# Patient Record
Sex: Female | Born: 1935 | Race: White | Hispanic: No | Smoking: Never smoker
Health system: Southern US, Community
[De-identification: ages and names within clinical notes are randomized; demographics above are authoritative.]

## PROBLEM LIST (undated history)

## (undated) DIAGNOSIS — Z789 Other specified health status: Secondary | ICD-10-CM

## (undated) DIAGNOSIS — M47816 Spondylosis without myelopathy or radiculopathy, lumbar region: Secondary | ICD-10-CM

## (undated) DIAGNOSIS — C801 Malignant (primary) neoplasm, unspecified: Secondary | ICD-10-CM

## (undated) DIAGNOSIS — C8590 Non-Hodgkin lymphoma, unspecified, unspecified site: Principal | ICD-10-CM

## (undated) DIAGNOSIS — J449 Chronic obstructive pulmonary disease, unspecified: Secondary | ICD-10-CM

## (undated) DIAGNOSIS — I10 Essential (primary) hypertension: Secondary | ICD-10-CM

## (undated) DIAGNOSIS — F0390 Unspecified dementia without behavioral disturbance: Secondary | ICD-10-CM

## (undated) DIAGNOSIS — M810 Age-related osteoporosis without current pathological fracture: Secondary | ICD-10-CM

## (undated) DIAGNOSIS — E785 Hyperlipidemia, unspecified: Secondary | ICD-10-CM

## (undated) HISTORY — PX: APPENDECTOMY: SHX54

## (undated) HISTORY — DX: Hyperlipidemia, unspecified: E78.5

## (undated) HISTORY — PX: SHOULDER SURGERY: SHX246

## (undated) HISTORY — PX: TONSILLECTOMY: SUR1361

## (undated) HISTORY — DX: Essential (primary) hypertension: I10

## (undated) HISTORY — PX: ABDOMINAL HYSTERECTOMY: SHX81

## (undated) HISTORY — DX: Spondylosis without myelopathy or radiculopathy, lumbar region: M47.816

## (undated) HISTORY — PX: HERNIA REPAIR: SHX51

## (undated) HISTORY — PX: COLON SURGERY: SHX602

## (undated) HISTORY — DX: Chronic obstructive pulmonary disease, unspecified: J44.9

## (undated) HISTORY — PX: HEMORROIDECTOMY: SUR656

## (undated) HISTORY — DX: Non-Hodgkin lymphoma, unspecified, unspecified site: C85.90

## (undated) HISTORY — DX: Age-related osteoporosis without current pathological fracture: M81.0

## (undated) HISTORY — PX: SPLENECTOMY: SUR1306

---

## 1997-12-06 ENCOUNTER — Other Ambulatory Visit: Admission: RE | Admit: 1997-12-06 | Discharge: 1997-12-06 | Payer: Self-pay | Admitting: Oncology

## 1997-12-08 ENCOUNTER — Other Ambulatory Visit: Admission: RE | Admit: 1997-12-08 | Discharge: 1997-12-08 | Payer: Self-pay | Admitting: Gastroenterology

## 1997-12-09 ENCOUNTER — Other Ambulatory Visit: Admission: RE | Admit: 1997-12-09 | Discharge: 1997-12-09 | Payer: Self-pay | Admitting: Gastroenterology

## 1997-12-11 ENCOUNTER — Other Ambulatory Visit: Admission: RE | Admit: 1997-12-11 | Discharge: 1997-12-11 | Payer: Self-pay | Admitting: Gastroenterology

## 1997-12-13 ENCOUNTER — Other Ambulatory Visit: Admission: RE | Admit: 1997-12-13 | Discharge: 1997-12-13 | Payer: Self-pay | Admitting: Oncology

## 1997-12-24 ENCOUNTER — Ambulatory Visit (HOSPITAL_COMMUNITY): Admission: RE | Admit: 1997-12-24 | Discharge: 1997-12-24 | Payer: Self-pay | Admitting: Gastroenterology

## 1998-04-15 ENCOUNTER — Encounter: Payer: Self-pay | Admitting: Oncology

## 1998-04-15 ENCOUNTER — Ambulatory Visit (HOSPITAL_COMMUNITY): Admission: RE | Admit: 1998-04-15 | Discharge: 1998-04-15 | Payer: Self-pay | Admitting: Oncology

## 1998-06-13 ENCOUNTER — Other Ambulatory Visit: Admission: RE | Admit: 1998-06-13 | Discharge: 1998-06-13 | Payer: Self-pay | Admitting: Gynecology

## 1998-08-13 ENCOUNTER — Ambulatory Visit: Admission: RE | Admit: 1998-08-13 | Discharge: 1998-08-13 | Payer: Self-pay | Admitting: Endocrinology

## 1998-08-13 ENCOUNTER — Encounter: Payer: Self-pay | Admitting: Endocrinology

## 1998-08-27 ENCOUNTER — Ambulatory Visit (HOSPITAL_COMMUNITY): Admission: RE | Admit: 1998-08-27 | Discharge: 1998-08-27 | Payer: Self-pay | Admitting: Endocrinology

## 1998-08-27 ENCOUNTER — Encounter: Payer: Self-pay | Admitting: Endocrinology

## 1998-10-21 ENCOUNTER — Ambulatory Visit (HOSPITAL_COMMUNITY): Admission: RE | Admit: 1998-10-21 | Discharge: 1998-10-21 | Payer: Self-pay | Admitting: Gastroenterology

## 1998-10-21 ENCOUNTER — Encounter: Payer: Self-pay | Admitting: Gastroenterology

## 1999-02-17 ENCOUNTER — Ambulatory Visit (HOSPITAL_COMMUNITY): Admission: RE | Admit: 1999-02-17 | Discharge: 1999-02-17 | Payer: Self-pay | Admitting: Oncology

## 1999-02-17 ENCOUNTER — Encounter: Payer: Self-pay | Admitting: Oncology

## 1999-05-08 ENCOUNTER — Encounter: Payer: Self-pay | Admitting: Endocrinology

## 1999-05-08 ENCOUNTER — Ambulatory Visit (HOSPITAL_COMMUNITY): Admission: RE | Admit: 1999-05-08 | Discharge: 1999-05-08 | Payer: Self-pay | Admitting: Endocrinology

## 1999-05-20 ENCOUNTER — Encounter: Admission: RE | Admit: 1999-05-20 | Discharge: 1999-05-20 | Payer: Self-pay | Admitting: Endocrinology

## 1999-05-20 ENCOUNTER — Encounter: Payer: Self-pay | Admitting: Endocrinology

## 1999-08-13 ENCOUNTER — Encounter: Payer: Self-pay | Admitting: Oncology

## 1999-08-13 ENCOUNTER — Ambulatory Visit: Admission: RE | Admit: 1999-08-13 | Discharge: 1999-08-13 | Payer: Self-pay | Admitting: Oncology

## 1999-08-20 ENCOUNTER — Other Ambulatory Visit: Admission: RE | Admit: 1999-08-20 | Discharge: 1999-08-20 | Payer: Self-pay | Admitting: Gynecology

## 1999-10-19 ENCOUNTER — Encounter: Payer: Self-pay | Admitting: Endocrinology

## 1999-10-19 ENCOUNTER — Ambulatory Visit (HOSPITAL_COMMUNITY): Admission: RE | Admit: 1999-10-19 | Discharge: 1999-10-19 | Payer: Self-pay | Admitting: Endocrinology

## 1999-10-20 ENCOUNTER — Inpatient Hospital Stay (HOSPITAL_COMMUNITY): Admission: AD | Admit: 1999-10-20 | Discharge: 1999-11-11 | Payer: Self-pay | Admitting: Endocrinology

## 1999-10-20 ENCOUNTER — Encounter (INDEPENDENT_AMBULATORY_CARE_PROVIDER_SITE_OTHER): Payer: Self-pay | Admitting: Specialist

## 1999-10-21 ENCOUNTER — Encounter: Payer: Self-pay | Admitting: Endocrinology

## 1999-10-25 ENCOUNTER — Encounter: Payer: Self-pay | Admitting: Oncology

## 1999-10-26 ENCOUNTER — Encounter: Payer: Self-pay | Admitting: Oncology

## 1999-10-29 ENCOUNTER — Encounter: Payer: Self-pay | Admitting: Oncology

## 1999-10-30 ENCOUNTER — Encounter: Payer: Self-pay | Admitting: Oncology

## 1999-11-17 ENCOUNTER — Inpatient Hospital Stay (HOSPITAL_COMMUNITY): Admission: AD | Admit: 1999-11-17 | Discharge: 1999-11-23 | Payer: Self-pay | Admitting: Oncology

## 1999-11-17 ENCOUNTER — Encounter (INDEPENDENT_AMBULATORY_CARE_PROVIDER_SITE_OTHER): Payer: Self-pay | Admitting: *Deleted

## 1999-11-25 ENCOUNTER — Inpatient Hospital Stay (HOSPITAL_COMMUNITY): Admission: AD | Admit: 1999-11-25 | Discharge: 1999-11-30 | Payer: Self-pay | Admitting: Oncology

## 2000-02-23 ENCOUNTER — Encounter: Payer: Self-pay | Admitting: Oncology

## 2000-02-23 ENCOUNTER — Encounter: Admission: RE | Admit: 2000-02-23 | Discharge: 2000-02-23 | Payer: Self-pay | Admitting: Oncology

## 2000-02-25 ENCOUNTER — Ambulatory Visit (HOSPITAL_COMMUNITY): Admission: RE | Admit: 2000-02-25 | Discharge: 2000-02-25 | Payer: Self-pay | Admitting: Oncology

## 2000-02-25 ENCOUNTER — Encounter: Payer: Self-pay | Admitting: Oncology

## 2000-03-03 ENCOUNTER — Encounter (INDEPENDENT_AMBULATORY_CARE_PROVIDER_SITE_OTHER): Payer: Self-pay | Admitting: Specialist

## 2000-03-03 ENCOUNTER — Ambulatory Visit (HOSPITAL_COMMUNITY): Admission: RE | Admit: 2000-03-03 | Discharge: 2000-03-03 | Payer: Self-pay | Admitting: Oncology

## 2000-03-19 ENCOUNTER — Ambulatory Visit (HOSPITAL_COMMUNITY): Admission: RE | Admit: 2000-03-19 | Discharge: 2000-03-19 | Payer: Self-pay | Admitting: Oncology

## 2000-03-20 ENCOUNTER — Encounter: Payer: Self-pay | Admitting: Oncology

## 2000-03-20 ENCOUNTER — Ambulatory Visit (HOSPITAL_COMMUNITY): Admission: RE | Admit: 2000-03-20 | Discharge: 2000-03-20 | Payer: Self-pay | Admitting: Oncology

## 2000-03-25 ENCOUNTER — Encounter (HOSPITAL_COMMUNITY): Admission: RE | Admit: 2000-03-25 | Discharge: 2000-06-23 | Payer: Self-pay | Admitting: Oncology

## 2000-03-25 ENCOUNTER — Encounter (INDEPENDENT_AMBULATORY_CARE_PROVIDER_SITE_OTHER): Payer: Self-pay | Admitting: *Deleted

## 2000-03-25 ENCOUNTER — Encounter: Payer: Self-pay | Admitting: Oncology

## 2000-04-11 ENCOUNTER — Encounter: Admission: RE | Admit: 2000-04-11 | Discharge: 2000-04-11 | Payer: Self-pay | Admitting: Orthopedic Surgery

## 2000-04-11 ENCOUNTER — Encounter: Payer: Self-pay | Admitting: Orthopedic Surgery

## 2000-06-23 ENCOUNTER — Ambulatory Visit (HOSPITAL_COMMUNITY): Admission: RE | Admit: 2000-06-23 | Discharge: 2000-06-23 | Payer: Self-pay | Admitting: Oncology

## 2000-06-23 ENCOUNTER — Encounter: Payer: Self-pay | Admitting: Oncology

## 2000-08-25 ENCOUNTER — Other Ambulatory Visit: Admission: RE | Admit: 2000-08-25 | Discharge: 2000-08-25 | Payer: Self-pay | Admitting: Gynecology

## 2000-08-30 ENCOUNTER — Encounter: Payer: Self-pay | Admitting: Oncology

## 2000-08-30 ENCOUNTER — Ambulatory Visit (HOSPITAL_COMMUNITY): Admission: RE | Admit: 2000-08-30 | Discharge: 2000-08-30 | Payer: Self-pay | Admitting: Oncology

## 2000-09-01 ENCOUNTER — Ambulatory Visit (HOSPITAL_COMMUNITY): Admission: RE | Admit: 2000-09-01 | Discharge: 2000-09-01 | Payer: Self-pay | Admitting: Oncology

## 2000-09-01 ENCOUNTER — Encounter: Payer: Self-pay | Admitting: Oncology

## 2000-09-05 ENCOUNTER — Ambulatory Visit (HOSPITAL_COMMUNITY): Admission: RE | Admit: 2000-09-05 | Discharge: 2000-09-05 | Payer: Self-pay | Admitting: Oncology

## 2000-09-05 ENCOUNTER — Encounter: Payer: Self-pay | Admitting: Oncology

## 2000-12-28 ENCOUNTER — Encounter: Payer: Self-pay | Admitting: Oncology

## 2000-12-28 ENCOUNTER — Ambulatory Visit (HOSPITAL_COMMUNITY): Admission: RE | Admit: 2000-12-28 | Discharge: 2000-12-28 | Payer: Self-pay | Admitting: Oncology

## 2001-02-21 ENCOUNTER — Encounter: Admission: RE | Admit: 2001-02-21 | Discharge: 2001-02-21 | Payer: Self-pay | Admitting: Endocrinology

## 2001-02-21 ENCOUNTER — Encounter: Payer: Self-pay | Admitting: Endocrinology

## 2001-04-17 ENCOUNTER — Encounter: Payer: Self-pay | Admitting: Oncology

## 2001-04-17 ENCOUNTER — Ambulatory Visit (HOSPITAL_COMMUNITY): Admission: RE | Admit: 2001-04-17 | Discharge: 2001-04-17 | Payer: Self-pay | Admitting: Oncology

## 2001-04-19 ENCOUNTER — Encounter: Payer: Self-pay | Admitting: Oncology

## 2001-04-19 ENCOUNTER — Ambulatory Visit (HOSPITAL_COMMUNITY): Admission: RE | Admit: 2001-04-19 | Discharge: 2001-04-19 | Payer: Self-pay | Admitting: Oncology

## 2001-07-17 ENCOUNTER — Ambulatory Visit (HOSPITAL_COMMUNITY): Admission: RE | Admit: 2001-07-17 | Discharge: 2001-07-17 | Payer: Self-pay | Admitting: Oncology

## 2001-07-17 ENCOUNTER — Encounter: Payer: Self-pay | Admitting: Oncology

## 2001-08-31 ENCOUNTER — Other Ambulatory Visit: Admission: RE | Admit: 2001-08-31 | Discharge: 2001-08-31 | Payer: Self-pay | Admitting: Gynecology

## 2002-01-16 ENCOUNTER — Encounter: Payer: Self-pay | Admitting: Oncology

## 2002-01-16 ENCOUNTER — Encounter (HOSPITAL_COMMUNITY): Admission: RE | Admit: 2002-01-16 | Discharge: 2002-04-16 | Payer: Self-pay | Admitting: Oncology

## 2002-01-19 ENCOUNTER — Encounter: Payer: Self-pay | Admitting: Oncology

## 2002-03-23 ENCOUNTER — Encounter: Payer: Self-pay | Admitting: Endocrinology

## 2002-03-23 ENCOUNTER — Encounter: Admission: RE | Admit: 2002-03-23 | Discharge: 2002-03-23 | Payer: Self-pay | Admitting: Endocrinology

## 2002-03-27 ENCOUNTER — Encounter: Admission: RE | Admit: 2002-03-27 | Discharge: 2002-03-27 | Payer: Self-pay | Admitting: Endocrinology

## 2002-03-27 ENCOUNTER — Encounter: Payer: Self-pay | Admitting: Endocrinology

## 2002-04-10 ENCOUNTER — Encounter: Payer: Self-pay | Admitting: Oncology

## 2002-04-10 ENCOUNTER — Ambulatory Visit (HOSPITAL_COMMUNITY): Admission: RE | Admit: 2002-04-10 | Discharge: 2002-04-10 | Payer: Self-pay | Admitting: Oncology

## 2002-07-23 ENCOUNTER — Ambulatory Visit (HOSPITAL_COMMUNITY): Admission: RE | Admit: 2002-07-23 | Discharge: 2002-07-23 | Payer: Self-pay | Admitting: Oncology

## 2002-07-23 ENCOUNTER — Encounter: Payer: Self-pay | Admitting: Oncology

## 2002-07-26 ENCOUNTER — Encounter: Payer: Self-pay | Admitting: Oncology

## 2002-07-26 ENCOUNTER — Ambulatory Visit (HOSPITAL_COMMUNITY): Admission: RE | Admit: 2002-07-26 | Discharge: 2002-07-26 | Payer: Self-pay | Admitting: Oncology

## 2002-11-06 ENCOUNTER — Encounter: Payer: Self-pay | Admitting: Oncology

## 2002-11-06 ENCOUNTER — Ambulatory Visit (HOSPITAL_COMMUNITY): Admission: RE | Admit: 2002-11-06 | Discharge: 2002-11-06 | Payer: Self-pay | Admitting: Oncology

## 2002-11-13 ENCOUNTER — Encounter: Payer: Self-pay | Admitting: Oncology

## 2002-11-13 ENCOUNTER — Ambulatory Visit (HOSPITAL_COMMUNITY): Admission: RE | Admit: 2002-11-13 | Discharge: 2002-11-13 | Payer: Self-pay | Admitting: Oncology

## 2003-02-18 ENCOUNTER — Encounter: Admission: RE | Admit: 2003-02-18 | Discharge: 2003-02-18 | Payer: Self-pay | Admitting: Endocrinology

## 2003-02-18 ENCOUNTER — Encounter: Payer: Self-pay | Admitting: Endocrinology

## 2003-04-18 ENCOUNTER — Ambulatory Visit (HOSPITAL_COMMUNITY): Admission: RE | Admit: 2003-04-18 | Discharge: 2003-04-18 | Payer: Self-pay | Admitting: Oncology

## 2003-04-22 ENCOUNTER — Ambulatory Visit (HOSPITAL_COMMUNITY): Admission: RE | Admit: 2003-04-22 | Discharge: 2003-04-22 | Payer: Self-pay | Admitting: Oncology

## 2003-05-15 ENCOUNTER — Encounter: Admission: RE | Admit: 2003-05-15 | Discharge: 2003-05-15 | Payer: Self-pay | Admitting: Orthopedic Surgery

## 2003-08-28 ENCOUNTER — Encounter (INDEPENDENT_AMBULATORY_CARE_PROVIDER_SITE_OTHER): Payer: Self-pay | Admitting: Specialist

## 2003-08-28 ENCOUNTER — Ambulatory Visit (HOSPITAL_COMMUNITY): Admission: RE | Admit: 2003-08-28 | Discharge: 2003-08-28 | Payer: Self-pay | Admitting: Gastroenterology

## 2003-09-30 ENCOUNTER — Other Ambulatory Visit: Admission: RE | Admit: 2003-09-30 | Discharge: 2003-09-30 | Payer: Self-pay | Admitting: Gynecology

## 2003-10-31 ENCOUNTER — Ambulatory Visit (HOSPITAL_COMMUNITY): Admission: RE | Admit: 2003-10-31 | Discharge: 2003-10-31 | Payer: Self-pay | Admitting: Oncology

## 2003-11-04 ENCOUNTER — Ambulatory Visit (HOSPITAL_COMMUNITY): Admission: RE | Admit: 2003-11-04 | Discharge: 2003-11-04 | Payer: Self-pay | Admitting: Oncology

## 2003-11-07 ENCOUNTER — Inpatient Hospital Stay (HOSPITAL_COMMUNITY): Admission: RE | Admit: 2003-11-07 | Discharge: 2003-11-09 | Payer: Self-pay | Admitting: Orthopedic Surgery

## 2003-12-17 ENCOUNTER — Ambulatory Visit (HOSPITAL_COMMUNITY): Admission: RE | Admit: 2003-12-17 | Discharge: 2003-12-17 | Payer: Self-pay | Admitting: Oncology

## 2004-01-23 ENCOUNTER — Encounter: Admission: RE | Admit: 2004-01-23 | Discharge: 2004-01-23 | Payer: Self-pay | Admitting: Endocrinology

## 2004-01-29 ENCOUNTER — Ambulatory Visit (HOSPITAL_COMMUNITY): Admission: RE | Admit: 2004-01-29 | Discharge: 2004-01-29 | Payer: Self-pay | Admitting: Oncology

## 2004-02-25 ENCOUNTER — Encounter: Admission: RE | Admit: 2004-02-25 | Discharge: 2004-02-25 | Payer: Self-pay | Admitting: Endocrinology

## 2004-05-05 ENCOUNTER — Encounter: Admission: RE | Admit: 2004-05-05 | Discharge: 2004-05-26 | Payer: Self-pay | Admitting: Orthopedic Surgery

## 2004-06-02 ENCOUNTER — Ambulatory Visit: Payer: Self-pay | Admitting: Oncology

## 2004-06-03 ENCOUNTER — Ambulatory Visit (HOSPITAL_COMMUNITY): Admission: RE | Admit: 2004-06-03 | Discharge: 2004-06-03 | Payer: Self-pay | Admitting: Oncology

## 2004-09-14 ENCOUNTER — Ambulatory Visit: Payer: Self-pay | Admitting: Oncology

## 2004-09-17 ENCOUNTER — Ambulatory Visit (HOSPITAL_COMMUNITY): Admission: RE | Admit: 2004-09-17 | Discharge: 2004-09-17 | Payer: Self-pay | Admitting: Oncology

## 2005-01-28 ENCOUNTER — Ambulatory Visit: Payer: Self-pay | Admitting: Oncology

## 2005-02-04 ENCOUNTER — Ambulatory Visit (HOSPITAL_COMMUNITY): Admission: RE | Admit: 2005-02-04 | Discharge: 2005-02-04 | Payer: Self-pay | Admitting: Oncology

## 2005-03-12 ENCOUNTER — Encounter: Admission: RE | Admit: 2005-03-12 | Discharge: 2005-03-12 | Payer: Self-pay | Admitting: Oncology

## 2005-05-31 ENCOUNTER — Ambulatory Visit: Payer: Self-pay | Admitting: Oncology

## 2005-08-30 ENCOUNTER — Ambulatory Visit: Payer: Self-pay | Admitting: Oncology

## 2005-08-31 LAB — COMPREHENSIVE METABOLIC PANEL
AST: 54 U/L — ABNORMAL HIGH (ref 0–37)
Albumin: 4.3 g/dL (ref 3.5–5.2)
Alkaline Phosphatase: 80 U/L (ref 39–117)
BUN: 19 mg/dL (ref 6–23)
Creatinine, Ser: 0.9 mg/dL (ref 0.4–1.2)
Glucose, Bld: 130 mg/dL — ABNORMAL HIGH (ref 70–99)
Potassium: 4.1 mEq/L (ref 3.5–5.3)
Total Bilirubin: 0.6 mg/dL (ref 0.3–1.2)

## 2005-08-31 LAB — CBC WITH DIFFERENTIAL/PLATELET
BASO%: 0.1 % (ref 0.0–2.0)
Eosinophils Absolute: 0.1 10*3/uL (ref 0.0–0.5)
HCT: 41 % (ref 34.8–46.6)
LYMPH%: 12.9 % — ABNORMAL LOW (ref 14.0–48.0)
MONO#: 1.2 10*3/uL — ABNORMAL HIGH (ref 0.1–0.9)
NEUT#: 7.7 10*3/uL — ABNORMAL HIGH (ref 1.5–6.5)
Platelets: 316 10*3/uL (ref 145–400)
RBC: 4.3 10*6/uL (ref 3.70–5.32)
WBC: 10.4 10*3/uL — ABNORMAL HIGH (ref 3.9–10.0)
lymph#: 1.3 10*3/uL (ref 0.9–3.3)

## 2005-08-31 LAB — MORPHOLOGY: PLT EST: ADEQUATE

## 2005-09-01 ENCOUNTER — Ambulatory Visit (HOSPITAL_COMMUNITY): Admission: RE | Admit: 2005-09-01 | Discharge: 2005-09-01 | Payer: Self-pay | Admitting: Oncology

## 2005-09-02 ENCOUNTER — Ambulatory Visit (HOSPITAL_COMMUNITY): Admission: RE | Admit: 2005-09-02 | Discharge: 2005-09-02 | Payer: Self-pay | Admitting: Oncology

## 2005-09-30 ENCOUNTER — Other Ambulatory Visit: Admission: RE | Admit: 2005-09-30 | Discharge: 2005-09-30 | Payer: Self-pay | Admitting: Gynecology

## 2005-12-28 ENCOUNTER — Ambulatory Visit (HOSPITAL_COMMUNITY): Admission: RE | Admit: 2005-12-28 | Discharge: 2005-12-28 | Payer: Self-pay | Admitting: Oncology

## 2005-12-28 ENCOUNTER — Ambulatory Visit: Payer: Self-pay | Admitting: Oncology

## 2005-12-28 LAB — CBC WITH DIFFERENTIAL/PLATELET
BASO%: 0.1 % (ref 0.0–2.0)
EOS%: 1.3 % (ref 0.0–7.0)
HCT: 40.6 % (ref 34.8–46.6)
HGB: 13.8 g/dL (ref 11.6–15.9)
LYMPH%: 26.2 % (ref 14.0–48.0)
MCH: 32.8 pg (ref 26.0–34.0)
MCHC: 34 g/dL (ref 32.0–36.0)
MONO#: 0.8 10*3/uL (ref 0.1–0.9)
NEUT%: 61.9 % (ref 39.6–76.8)
Platelets: 338 10*3/uL (ref 145–400)
RBC: 4.21 10*6/uL (ref 3.70–5.32)
RDW: 13.9 % (ref 11.3–14.5)
WBC: 7.5 10*3/uL (ref 3.9–10.0)
lymph#: 2 10*3/uL (ref 0.9–3.3)

## 2005-12-28 LAB — COMPREHENSIVE METABOLIC PANEL
Alkaline Phosphatase: 89 U/L (ref 39–117)
BUN: 18 mg/dL (ref 6–23)
CO2: 25 mEq/L (ref 19–32)
Glucose, Bld: 161 mg/dL — ABNORMAL HIGH (ref 70–99)
Total Bilirubin: 0.4 mg/dL (ref 0.3–1.2)

## 2005-12-28 LAB — LACTATE DEHYDROGENASE: LDH: 173 U/L (ref 94–250)

## 2005-12-28 LAB — MORPHOLOGY: PLT EST: ADEQUATE

## 2006-03-15 ENCOUNTER — Encounter: Admission: RE | Admit: 2006-03-15 | Discharge: 2006-03-15 | Payer: Self-pay | Admitting: Oncology

## 2006-04-13 ENCOUNTER — Other Ambulatory Visit: Admission: RE | Admit: 2006-04-13 | Discharge: 2006-04-13 | Payer: Self-pay | Admitting: Gynecology

## 2006-05-11 ENCOUNTER — Ambulatory Visit: Payer: Self-pay | Admitting: Oncology

## 2006-05-16 LAB — CBC WITH DIFFERENTIAL/PLATELET
EOS%: 1.2 % (ref 0.0–7.0)
Eosinophils Absolute: 0.1 10*3/uL (ref 0.0–0.5)
MCH: 31.9 pg (ref 26.0–34.0)
MCV: 96.3 fL (ref 81.0–101.0)
MONO%: 11.8 % (ref 0.0–13.0)
NEUT#: 6.1 10*3/uL (ref 1.5–6.5)
RBC: 3.99 10*6/uL (ref 3.70–5.32)
RDW: 14.3 % (ref 11.3–14.5)
lymph#: 1.8 10*3/uL (ref 0.9–3.3)

## 2006-05-16 LAB — COMPREHENSIVE METABOLIC PANEL
CO2: 23 mEq/L (ref 19–32)
Calcium: 9 mg/dL (ref 8.4–10.5)
Chloride: 99 mEq/L (ref 96–112)
Creatinine, Ser: 0.78 mg/dL (ref 0.40–1.20)
Glucose, Bld: 88 mg/dL (ref 70–99)
Total Bilirubin: 0.6 mg/dL (ref 0.3–1.2)
Total Protein: 6.7 g/dL (ref 6.0–8.3)

## 2006-05-16 LAB — LACTATE DEHYDROGENASE: LDH: 200 U/L (ref 94–250)

## 2006-05-19 ENCOUNTER — Ambulatory Visit (HOSPITAL_COMMUNITY): Admission: RE | Admit: 2006-05-19 | Discharge: 2006-05-19 | Payer: Self-pay | Admitting: Oncology

## 2006-10-13 ENCOUNTER — Other Ambulatory Visit: Admission: RE | Admit: 2006-10-13 | Discharge: 2006-10-13 | Payer: Self-pay | Admitting: Gynecology

## 2006-11-10 ENCOUNTER — Ambulatory Visit: Payer: Self-pay | Admitting: Oncology

## 2006-11-16 ENCOUNTER — Ambulatory Visit (HOSPITAL_COMMUNITY): Admission: RE | Admit: 2006-11-16 | Discharge: 2006-11-16 | Payer: Self-pay | Admitting: Oncology

## 2006-11-16 LAB — COMPREHENSIVE METABOLIC PANEL
ALT: 25 U/L (ref 0–35)
Albumin: 4.1 g/dL (ref 3.5–5.2)
Alkaline Phosphatase: 75 U/L (ref 39–117)
CO2: 26 mEq/L (ref 19–32)
Glucose, Bld: 138 mg/dL — ABNORMAL HIGH (ref 70–99)
Potassium: 4.4 mEq/L (ref 3.5–5.3)
Sodium: 140 mEq/L (ref 135–145)
Total Bilirubin: 0.4 mg/dL (ref 0.3–1.2)
Total Protein: 7 g/dL (ref 6.0–8.3)

## 2006-11-16 LAB — CBC WITH DIFFERENTIAL/PLATELET
Basophils Absolute: 0 10*3/uL (ref 0.0–0.1)
Eosinophils Absolute: 0.2 10*3/uL (ref 0.0–0.5)
HCT: 38.3 % (ref 34.8–46.6)
HGB: 13.1 g/dL (ref 11.6–15.9)
LYMPH%: 22.6 % (ref 14.0–48.0)
MONO#: 1.1 10*3/uL — ABNORMAL HIGH (ref 0.1–0.9)
NEUT#: 4.8 10*3/uL (ref 1.5–6.5)
NEUT%: 60.8 % (ref 39.6–76.8)
Platelets: 301 10*3/uL (ref 145–400)
WBC: 7.8 10*3/uL (ref 3.9–10.0)
lymph#: 1.8 10*3/uL (ref 0.9–3.3)

## 2006-11-16 LAB — MORPHOLOGY: PLT EST: ADEQUATE

## 2006-11-16 LAB — LACTATE DEHYDROGENASE: LDH: 154 U/L (ref 94–250)

## 2006-12-22 ENCOUNTER — Ambulatory Visit (HOSPITAL_COMMUNITY): Admission: RE | Admit: 2006-12-22 | Discharge: 2006-12-22 | Payer: Self-pay | Admitting: Endocrinology

## 2007-03-22 ENCOUNTER — Encounter: Admission: RE | Admit: 2007-03-22 | Discharge: 2007-03-22 | Payer: Self-pay | Admitting: Gynecology

## 2007-04-04 ENCOUNTER — Encounter: Admission: RE | Admit: 2007-04-04 | Discharge: 2007-04-04 | Payer: Self-pay | Admitting: Gynecology

## 2007-05-26 ENCOUNTER — Ambulatory Visit: Payer: Self-pay | Admitting: Oncology

## 2007-05-30 ENCOUNTER — Ambulatory Visit (HOSPITAL_COMMUNITY): Admission: RE | Admit: 2007-05-30 | Discharge: 2007-05-30 | Payer: Self-pay | Admitting: Oncology

## 2007-05-30 LAB — MORPHOLOGY

## 2007-05-30 LAB — CBC WITH DIFFERENTIAL/PLATELET
BASO%: 0.4 % (ref 0.0–2.0)
EOS%: 2.7 % (ref 0.0–7.0)
HCT: 42.1 % (ref 34.8–46.6)
LYMPH%: 34.2 % (ref 14.0–48.0)
MCH: 31.7 pg (ref 26.0–34.0)
MCHC: 33.4 g/dL (ref 32.0–36.0)
MONO#: 1.4 10*3/uL — ABNORMAL HIGH (ref 0.1–0.9)
NEUT%: 48 % (ref 39.6–76.8)
Platelets: 297 10*3/uL (ref 145–400)
RBC: 4.45 10*6/uL (ref 3.70–5.32)
WBC: 9.2 10*3/uL (ref 3.9–10.0)

## 2007-05-30 LAB — COMPREHENSIVE METABOLIC PANEL
ALT: 41 U/L — ABNORMAL HIGH (ref 0–35)
BUN: 17 mg/dL (ref 6–23)
CO2: 27 mEq/L (ref 19–32)
Calcium: 9.9 mg/dL (ref 8.4–10.5)
Chloride: 98 mEq/L (ref 96–112)
Creatinine, Ser: 0.95 mg/dL (ref 0.40–1.20)

## 2007-05-30 LAB — LACTATE DEHYDROGENASE: LDH: 17 U/L — ABNORMAL LOW (ref 94–250)

## 2007-09-27 ENCOUNTER — Ambulatory Visit: Payer: Self-pay | Admitting: Oncology

## 2007-11-27 ENCOUNTER — Ambulatory Visit: Payer: Self-pay | Admitting: Oncology

## 2007-11-29 ENCOUNTER — Ambulatory Visit (HOSPITAL_COMMUNITY): Admission: RE | Admit: 2007-11-29 | Discharge: 2007-11-29 | Payer: Self-pay | Admitting: Oncology

## 2007-11-29 LAB — COMPREHENSIVE METABOLIC PANEL
AST: 22 U/L (ref 0–37)
Albumin: 4.2 g/dL (ref 3.5–5.2)
BUN: 15 mg/dL (ref 6–23)
CO2: 27 mEq/L (ref 19–32)
Calcium: 10.3 mg/dL (ref 8.4–10.5)
Chloride: 100 mEq/L (ref 96–112)
Potassium: 4.4 mEq/L (ref 3.5–5.3)

## 2007-11-29 LAB — CBC WITH DIFFERENTIAL/PLATELET
BASO%: 0.4 % (ref 0.0–2.0)
Basophils Absolute: 0 10*3/uL (ref 0.0–0.1)
EOS%: 3.9 % (ref 0.0–7.0)
HCT: 41 % (ref 34.8–46.6)
HGB: 14 g/dL (ref 11.6–15.9)
LYMPH%: 25.2 % (ref 14.0–48.0)
MCH: 31.5 pg (ref 26.0–34.0)
MCHC: 34 g/dL (ref 32.0–36.0)
MCV: 92.7 fL (ref 81.0–101.0)
MONO%: 13.1 % — ABNORMAL HIGH (ref 0.0–13.0)
NEUT%: 57.4 % (ref 39.6–76.8)
Platelets: 295 10*3/uL (ref 145–400)
lymph#: 1.8 10*3/uL (ref 0.9–3.3)

## 2007-11-29 LAB — MORPHOLOGY

## 2007-11-29 LAB — LACTATE DEHYDROGENASE: LDH: 150 U/L (ref 94–250)

## 2008-03-22 ENCOUNTER — Encounter: Admission: RE | Admit: 2008-03-22 | Discharge: 2008-03-22 | Payer: Self-pay | Admitting: Endocrinology

## 2008-05-22 ENCOUNTER — Ambulatory Visit: Payer: Self-pay | Admitting: Oncology

## 2008-05-24 LAB — COMPREHENSIVE METABOLIC PANEL
ALT: 25 U/L (ref 0–35)
AST: 30 U/L (ref 0–37)
CO2: 25 mEq/L (ref 19–32)
Creatinine, Ser: 1.06 mg/dL (ref 0.40–1.20)
Sodium: 136 mEq/L (ref 135–145)
Total Bilirubin: 0.4 mg/dL (ref 0.3–1.2)
Total Protein: 6.5 g/dL (ref 6.0–8.3)

## 2008-05-24 LAB — CBC WITH DIFFERENTIAL/PLATELET
Basophils Absolute: 0 10*3/uL (ref 0.0–0.1)
EOS%: 4.1 % (ref 0.0–7.0)
Eosinophils Absolute: 0.3 10*3/uL (ref 0.0–0.5)
HCT: 38.7 % (ref 34.8–46.6)
HGB: 13.1 g/dL (ref 11.6–15.9)
MCH: 32.1 pg (ref 26.0–34.0)
MCV: 94.9 fL (ref 81.0–101.0)
MONO%: 13.9 % — ABNORMAL HIGH (ref 0.0–13.0)
NEUT#: 3.6 10*3/uL (ref 1.5–6.5)
NEUT%: 49.6 % (ref 39.6–76.8)
RDW: 13.8 % (ref 11.3–14.5)
lymph#: 2.3 10*3/uL (ref 0.9–3.3)

## 2008-05-24 LAB — MORPHOLOGY: PLT EST: ADEQUATE

## 2008-05-27 ENCOUNTER — Ambulatory Visit (HOSPITAL_COMMUNITY): Admission: RE | Admit: 2008-05-27 | Discharge: 2008-05-27 | Payer: Self-pay | Admitting: Oncology

## 2009-01-27 ENCOUNTER — Ambulatory Visit (HOSPITAL_COMMUNITY): Admission: RE | Admit: 2009-01-27 | Discharge: 2009-01-28 | Payer: Self-pay | Admitting: General Surgery

## 2009-05-23 ENCOUNTER — Ambulatory Visit: Payer: Self-pay | Admitting: Oncology

## 2009-05-30 LAB — COMPREHENSIVE METABOLIC PANEL
AST: 33 U/L (ref 0–37)
Alkaline Phosphatase: 48 U/L (ref 39–117)
BUN: 16 mg/dL (ref 6–23)
Creatinine, Ser: 1.2 mg/dL (ref 0.40–1.20)
Glucose, Bld: 91 mg/dL (ref 70–99)
Total Bilirubin: 0.9 mg/dL (ref 0.3–1.2)

## 2009-05-30 LAB — MORPHOLOGY: PLT EST: ADEQUATE

## 2009-05-30 LAB — CBC WITH DIFFERENTIAL/PLATELET
BASO%: 1 % (ref 0.0–2.0)
Basophils Absolute: 0.1 10*3/uL (ref 0.0–0.1)
EOS%: 2.1 % (ref 0.0–7.0)
HCT: 38.8 % (ref 34.8–46.6)
LYMPH%: 22.8 % (ref 14.0–49.7)
MCH: 30.8 pg (ref 25.1–34.0)
MCHC: 33 g/dL (ref 31.5–36.0)
MCV: 93.3 fL (ref 79.5–101.0)
MONO%: 20.5 % — ABNORMAL HIGH (ref 0.0–14.0)
NEUT%: 53.6 % (ref 38.4–76.8)
lymph#: 1.9 10*3/uL (ref 0.9–3.3)

## 2009-06-04 ENCOUNTER — Ambulatory Visit (HOSPITAL_COMMUNITY): Admission: RE | Admit: 2009-06-04 | Discharge: 2009-06-04 | Payer: Self-pay | Admitting: Oncology

## 2009-07-21 ENCOUNTER — Inpatient Hospital Stay (HOSPITAL_COMMUNITY): Admission: EM | Admit: 2009-07-21 | Discharge: 2009-07-24 | Payer: Self-pay | Admitting: Endocrinology

## 2010-01-28 ENCOUNTER — Encounter: Admission: RE | Admit: 2010-01-28 | Discharge: 2010-01-28 | Payer: Self-pay | Admitting: Otolaryngology

## 2010-05-28 ENCOUNTER — Ambulatory Visit: Payer: Self-pay | Admitting: Oncology

## 2010-06-01 LAB — CBC WITH DIFFERENTIAL/PLATELET
BASO%: 0.5 % (ref 0.0–2.0)
Basophils Absolute: 0 10*3/uL (ref 0.0–0.1)
EOS%: 2.7 % (ref 0.0–7.0)
Eosinophils Absolute: 0.2 10*3/uL (ref 0.0–0.5)
HCT: 41.4 % (ref 34.8–46.6)
HGB: 13.6 g/dL (ref 11.6–15.9)
LYMPH%: 30 % (ref 14.0–49.7)
MCH: 30.6 pg (ref 25.1–34.0)
MCHC: 32.8 g/dL (ref 31.5–36.0)
MCV: 93.4 fL (ref 79.5–101.0)
MONO#: 1 10*3/uL — ABNORMAL HIGH (ref 0.1–0.9)
MONO%: 13.3 % (ref 0.0–14.0)
NEUT#: 4.2 10*3/uL (ref 1.5–6.5)
NEUT%: 53.5 % (ref 38.4–76.8)
Platelets: 320 10*3/uL (ref 145–400)
RBC: 4.43 10*6/uL (ref 3.70–5.45)
RDW: 14 % (ref 11.2–14.5)
WBC: 7.8 10*3/uL (ref 3.9–10.3)
lymph#: 2.3 10*3/uL (ref 0.9–3.3)

## 2010-06-01 LAB — COMPREHENSIVE METABOLIC PANEL
ALT: 21 U/L (ref 0–35)
AST: 41 U/L — ABNORMAL HIGH (ref 0–37)
Albumin: 4.3 g/dL (ref 3.5–5.2)
Alkaline Phosphatase: 55 U/L (ref 39–117)
BUN: 19 mg/dL (ref 6–23)
CO2: 25 mEq/L (ref 19–32)
Calcium: 10.8 mg/dL — ABNORMAL HIGH (ref 8.4–10.5)
Chloride: 100 mEq/L (ref 96–112)
Creatinine, Ser: 0.91 mg/dL (ref 0.40–1.20)
Glucose, Bld: 83 mg/dL (ref 70–99)
Potassium: 4.4 mEq/L (ref 3.5–5.3)
Sodium: 137 mEq/L (ref 135–145)
Total Bilirubin: 0.5 mg/dL (ref 0.3–1.2)
Total Protein: 7 g/dL (ref 6.0–8.3)

## 2010-06-01 LAB — SEDIMENTATION RATE: Sed Rate: 5 mm/hr (ref 0–22)

## 2010-06-01 LAB — MORPHOLOGY: PLT EST: ADEQUATE

## 2010-06-01 LAB — LACTATE DEHYDROGENASE: LDH: 161 U/L (ref 94–250)

## 2010-06-07 ENCOUNTER — Encounter: Payer: Self-pay | Admitting: Oncology

## 2010-06-09 ENCOUNTER — Other Ambulatory Visit: Payer: Self-pay | Admitting: Oncology

## 2010-06-09 DIAGNOSIS — C8590 Non-Hodgkin lymphoma, unspecified, unspecified site: Secondary | ICD-10-CM

## 2010-06-17 ENCOUNTER — Ambulatory Visit (HOSPITAL_COMMUNITY)
Admission: RE | Admit: 2010-06-17 | Discharge: 2010-06-17 | Disposition: A | Discharge status: Home / Self Care | Payer: Medicare Other | Source: Ambulatory Visit | Attending: Endocrinology | Admitting: Endocrinology

## 2010-06-17 ENCOUNTER — Other Ambulatory Visit (HOSPITAL_COMMUNITY): Payer: Self-pay | Admitting: Endocrinology

## 2010-06-17 DIAGNOSIS — S52539A Colles' fracture of unspecified radius, initial encounter for closed fracture: Secondary | ICD-10-CM | POA: Insufficient documentation

## 2010-06-17 DIAGNOSIS — W19XXXA Unspecified fall, initial encounter: Secondary | ICD-10-CM

## 2010-06-17 DIAGNOSIS — M19049 Primary osteoarthritis, unspecified hand: Secondary | ICD-10-CM | POA: Insufficient documentation

## 2010-06-17 DIAGNOSIS — M11829 Other specified crystal arthropathies, unspecified elbow: Secondary | ICD-10-CM | POA: Insufficient documentation

## 2010-06-17 DIAGNOSIS — M19039 Primary osteoarthritis, unspecified wrist: Secondary | ICD-10-CM | POA: Insufficient documentation

## 2010-06-30 ENCOUNTER — Other Ambulatory Visit: Payer: Self-pay | Admitting: Otolaryngology

## 2010-06-30 DIAGNOSIS — R52 Pain, unspecified: Secondary | ICD-10-CM

## 2010-07-06 ENCOUNTER — Ambulatory Visit
Admission: RE | Admit: 2010-07-06 | Discharge: 2010-07-06 | Disposition: A | Discharge status: Home / Self Care | Payer: Medicare Other | Source: Ambulatory Visit | Attending: Otolaryngology | Admitting: Otolaryngology

## 2010-07-06 DIAGNOSIS — R52 Pain, unspecified: Secondary | ICD-10-CM

## 2010-07-06 MED ORDER — GADOBENATE DIMEGLUMINE 529 MG/ML IV SOLN
9.0000 mL | Freq: Once | INTRAVENOUS | Status: AC | PRN
  Administered 2010-07-06: 9 mL via INTRAVENOUS

## 2010-07-14 ENCOUNTER — Ambulatory Visit (HOSPITAL_COMMUNITY)
Admission: RE | Admit: 2010-07-14 | Discharge: 2010-07-14 | Disposition: A | Discharge status: Home / Self Care | Payer: Medicare Other | Source: Ambulatory Visit | Attending: Oncology | Admitting: Oncology

## 2010-07-14 ENCOUNTER — Encounter (HOSPITAL_COMMUNITY): Payer: Self-pay

## 2010-07-14 ENCOUNTER — Encounter (HOSPITAL_COMMUNITY)
Admission: RE | Admit: 2010-07-14 | Discharge: 2010-07-14 | Disposition: A | Discharge status: Home / Self Care | Payer: Medicare Other | Source: Ambulatory Visit | Attending: Oncology | Admitting: Oncology

## 2010-07-14 DIAGNOSIS — S62109A Fracture of unspecified carpal bone, unspecified wrist, initial encounter for closed fracture: Secondary | ICD-10-CM | POA: Insufficient documentation

## 2010-07-14 DIAGNOSIS — M79609 Pain in unspecified limb: Secondary | ICD-10-CM | POA: Insufficient documentation

## 2010-07-14 DIAGNOSIS — W19XXXA Unspecified fall, initial encounter: Secondary | ICD-10-CM | POA: Insufficient documentation

## 2010-07-14 DIAGNOSIS — C8590 Non-Hodgkin lymphoma, unspecified, unspecified site: Secondary | ICD-10-CM

## 2010-07-14 HISTORY — DX: Malignant (primary) neoplasm, unspecified: C80.1

## 2010-07-14 MED ORDER — TECHNETIUM TC 99M MEDRONATE IV KIT
23.1000 | PACK | Freq: Once | INTRAVENOUS | Status: AC | PRN
  Administered 2010-07-14: 23.1 via INTRAVENOUS

## 2010-07-21 ENCOUNTER — Other Ambulatory Visit: Payer: Self-pay | Admitting: Oncology

## 2010-07-21 ENCOUNTER — Encounter (HOSPITAL_BASED_OUTPATIENT_CLINIC_OR_DEPARTMENT_OTHER): Payer: Medicare Other | Admitting: Oncology

## 2010-07-21 DIAGNOSIS — C8589 Other specified types of non-Hodgkin lymphoma, extranodal and solid organ sites: Secondary | ICD-10-CM

## 2010-07-21 LAB — COMPREHENSIVE METABOLIC PANEL
ALT: 18 U/L (ref 0–35)
CO2: 23 mEq/L (ref 19–32)
Calcium: 10 mg/dL (ref 8.4–10.5)
Chloride: 101 mEq/L (ref 96–112)
Creatinine, Ser: 0.98 mg/dL (ref 0.40–1.20)
Glucose, Bld: 86 mg/dL (ref 70–99)
Sodium: 136 mEq/L (ref 135–145)
Total Bilirubin: 0.6 mg/dL (ref 0.3–1.2)
Total Protein: 7.2 g/dL (ref 6.0–8.3)

## 2010-07-21 LAB — LACTATE DEHYDROGENASE: LDH: 187 U/L (ref 94–250)

## 2010-07-21 LAB — CBC WITH DIFFERENTIAL/PLATELET
Basophils Absolute: 0.1 10*3/uL (ref 0.0–0.1)
Eosinophils Absolute: 0.2 10*3/uL (ref 0.0–0.5)
HGB: 13.2 g/dL (ref 11.6–15.9)
LYMPH%: 28.5 % (ref 14.0–49.7)
MCV: 88.4 fL (ref 79.5–101.0)
MONO#: 1.2 10*3/uL — ABNORMAL HIGH (ref 0.1–0.9)
MONO%: 11.9 % (ref 0.0–14.0)
NEUT#: 5.5 10*3/uL (ref 1.5–6.5)
Platelets: 308 10*3/uL (ref 145–400)
RBC: 4.4 10*6/uL (ref 3.70–5.45)
WBC: 9.7 10*3/uL (ref 3.9–10.3)
nRBC: 0 % (ref 0–0)

## 2010-08-09 LAB — BASIC METABOLIC PANEL
BUN: 6 mg/dL (ref 6–23)
BUN: 9 mg/dL (ref 6–23)
CO2: 24 mEq/L (ref 19–32)
CO2: 27 mEq/L (ref 19–32)
Calcium: 9 mg/dL (ref 8.4–10.5)
Chloride: 109 mEq/L (ref 96–112)
Creatinine, Ser: 0.8 mg/dL (ref 0.4–1.2)
GFR calc non Af Amer: >60 mL/min (ref >60)
GFR calc non Af Amer: >60 mL/min (ref >60)
Glucose, Bld: 85 mg/dL (ref 70–99)
Glucose, Bld: 85 mg/dL (ref 70–99)
Glucose, Bld: 95 mg/dL (ref 70–99)
Potassium: 3.3 mEq/L — ABNORMAL LOW (ref 3.5–5.1)
Potassium: 3.8 mEq/L (ref 3.5–5.1)
Sodium: 134 mEq/L — ABNORMAL LOW (ref 135–145)
Sodium: 139 mEq/L (ref 135–145)

## 2010-08-09 LAB — PROTIME-INR
Prothrombin Time: 58 seconds — ABNORMAL HIGH (ref 11.6–15.2)
Prothrombin Time: 61.4 seconds — ABNORMAL HIGH (ref 11.6–15.2)

## 2010-08-09 LAB — HEPATIC FUNCTION PANEL
AST: 33 U/L (ref 0–37)
Bilirubin, Direct: <0.1 mg/dL (ref 0.0–0.3)
Total Bilirubin: 0.6 mg/dL (ref 0.3–1.2)

## 2010-08-09 LAB — URINE CULTURE
Colony Count: NO GROWTH
Culture: NO GROWTH
Special Requests: NEGATIVE

## 2010-08-09 LAB — CBC
HCT: 28.8 % — ABNORMAL LOW (ref 36.0–46.0)
Hemoglobin: 10.6 g/dL — ABNORMAL LOW (ref 12.0–15.0)
Hemoglobin: 9.3 g/dL — ABNORMAL LOW (ref 12.0–15.0)
MCHC: 32.3 g/dL (ref 30.0–36.0)
MCHC: 32.8 g/dL (ref 30.0–36.0)
MCV: 97.1 fL (ref 78.0–100.0)
Platelets: 357 10*3/uL (ref 150–400)
RDW: 14.5 % (ref 11.5–15.5)
RDW: 14.8 % (ref 11.5–15.5)

## 2010-08-09 LAB — URINALYSIS, ROUTINE W REFLEX MICROSCOPIC
Bilirubin Urine: NEGATIVE
Nitrite: NEGATIVE
Urobilinogen, UA: 0.2 mg/dL (ref 0.0–1.0)
pH: 7 (ref 5.0–8.0)

## 2010-08-21 LAB — COMPREHENSIVE METABOLIC PANEL
Albumin: 3.9 g/dL (ref 3.5–5.2)
BUN: 21 mg/dL (ref 6–23)
Creatinine, Ser: 1.15 mg/dL (ref 0.4–1.2)
Total Bilirubin: 0.4 mg/dL (ref 0.3–1.2)
Total Protein: 7.8 g/dL (ref 6.0–8.3)

## 2010-08-21 LAB — URINE MICROSCOPIC-ADD ON

## 2010-08-21 LAB — DIFFERENTIAL
Basophils Absolute: 0.1 10*3/uL (ref 0.0–0.1)
Eosinophils Relative: 2 % (ref 0–5)
Monocytes Absolute: 1.5 10*3/uL — ABNORMAL HIGH (ref 0.1–1.0)
Monocytes Relative: 16 % — ABNORMAL HIGH (ref 3–12)
Neutrophils Relative %: 58 % (ref 43–77)

## 2010-08-21 LAB — URINALYSIS, ROUTINE W REFLEX MICROSCOPIC
Ketones, ur: NEGATIVE mg/dL
Nitrite: NEGATIVE
Urobilinogen, UA: 0.2 mg/dL (ref 0.0–1.0)
pH: 7.5 (ref 5.0–8.0)

## 2010-08-21 LAB — CBC
HCT: 40.7 % (ref 36.0–46.0)
MCV: 95.4 fL (ref 78.0–100.0)
Platelets: 349 10*3/uL (ref 150–400)
RDW: 14.5 % (ref 11.5–15.5)

## 2010-10-02 NOTE — Discharge Summary (Signed)
NAME:  [name], [name]                      ACCOUNT NO.:  [id number]   MEDICAL RECORD NO.:  [id number]                   PATIENT TYPE:  INP   LOCATION:  0440                                 FACILITY:  [facility]   PHYSICIAN:  Marlowe Kays, M.D.               DATE OF BIRTH:  [birth year]   DATE OF ADMISSION:  11/06/2003  DATE OF DISCHARGE:  11/08/2003                                 DISCHARGE SUMMARY   ADMISSION DIAGNOSES:  1. Chronic impingement syndrome with rotator cuff tendonopathy of the right     shoulder.  2. Hypertension.  3. History of a stroke in the distant past.  4. Diverticulitis.   DISCHARGE DIAGNOSES:  1. Chronic impingement syndrome with degeneration of labrum and full-     thickness rotator cuff tear of the right shoulder.  2. Hypertension.  3. History of a stroke in the distant past.  4. Diverticulitis.  5. Postoperative nausea secondary to anesthesia and analgesics.   OPERATION:  1. On November 06, 2003, the patient underwent a right shoulder arthroscopy with     debridement of labrum, biceps tendon, and shaving up underneath the     surface of the rotator cuff.  2. Arthroscopic subacromial decompression.  3. Mini open rotator cuff repair of the right shoulder.   ASSISTANT:  Dooley L. Cherlynn June.   BRIEF HISTORY:  This 77 year old lady seen by [place] for continued progressive  problems concerning her right shoulder.  She had pain and discomfort now  going on for a considerable period of time.  After medical clearance by Dr.  Evlyn Kanner, it was felt that the patient would benefit from surgical intervention  and is admitted for the above procedure.   HOSPITAL COURSE:  The patient tolerated the surgical procedure quite well;  however, had a considerable amount of nausea and vomiting postoperatively.  We adjusted her postoperative analgesics, and apparently the oxycodone and  the hydrocodone were the causative factors concerning her nausea.  Eventually, we were able  to give her Darvocet-N 100 for her discomfort.  We  used Zofran, Phenergan, as well as Reglan for her nausea.  We had to keep  her for observations, and she was unable to receive occupational therapy due  to her extreme and profound nausea and vomiting.  The morning of discharge,  she had no nausea since midnight.  It was hoped that her morning would go  well.  She will respond to occupational therapy with her husband present for  home directions.  If she is able to tolerate the Darvocet for discomfort and  Robaxin as a muscle relaxant with no nausea, then we will discharge her home  later today.  Otherwise, we will have to keep her in until Saturday to be  sure that she will not have any incidents at home.   Dressing was changed today.  The wound and portal were all dry, and it was  redressed.  Neurovascular  is intact at the right upper extremity.  Vital  signs were stable.  Patient was awake, alert and oriented.  Sling was in  place.   Laboratory values in the hospital showed a PT/PTT within normal limits.  All  other labs were from Dr. Rinaldo Cloud office.  No chest x-ray was seen.  Dr.  Rinaldo Cloud electrocardiogram was on the chart.   CONDITION ON DISCHARGE:  Improved and stable.   PLAN:  The patient is discharged home in the care of her husband to wear the  sling the majority of the time.  No real range of motion of the right  shoulder.  She may shower four days after surgery.  Be attentive to the  instructions given her by occupational therapy.  She is to resume her home  medications and diet under the direction of Dr. Adrian Prince.   Her medications are Darvocet-N 100 #30 with 2 refills 1-2 q.4-6h. p.r.n.  pain, Robaxin 500 mg #30 with 2 refills 1 q.6-8h. p.r.n. muscle spasm, and  Phenergan 25 mg 1/2 tab to 1 tab q.6h. p.r.n. nausea.   She will continue with a dry dressing to the right shoulder p.r.n.  May  shower four days postop.  Return in two weeks to see Cherly Beach, PA-C   on November 26, 2003.  See Dr. Evlyn Kanner as needed.     Dooley L. Cherlynn June.                 Marlowe Kays, M.D.    DLU/MEDQ  D:  11/08/2003  T:  11/08/2003  Job:  [id number]   cc:   Jeannett Senior A. Evlyn Kanner, M.D.  [address]  [place]  [place] [address]  Fax: [phone]

## 2010-10-02 NOTE — Op Note (Signed)
NAME:  [name], [name]                      ACCOUNT NO.:  [id number]   MEDICAL RECORD NO.:  [id number]                   PATIENT TYPE:  OBV   LOCATION:  0440                                 FACILITY:  [facility]   PHYSICIAN:  Marlowe Kays, M.D.               DATE OF BIRTH:  1922/06/21   DATE OF PROCEDURE:  11/06/2003  DATE OF DISCHARGE:                                 OPERATIVE REPORT   PREOPERATIVE DIAGNOSES:  Chronic impingement syndrome with rotator cuff  tendinopathy right shoulder.   POSTOPERATIVE DIAGNOSES:  Chronic impingement syndrome with degeneration of  labrum and full thickness rotator cuff tear.   OPERATION:  1. Right shoulder arthroscopy with debridement of labrum, biceps tendon and     shaving of underneath surface of rotator cuff.  2. Arthroscopic subacromial decompression.  3. Mini open rotator cuff repair.   SURGEON:  Marlowe Kays, M.D.   ASSISTANTDruscilla Brownie. Cherlynn June.   ANESTHESIA:  General.   PATHOLOGY AND JUSTIFICATION FOR PROCEDURE:  She has had a long history of  right shoulder pain with an MRI showing severe impingement syndrome  associated with degenerative arthritis of the [facility] joint but no full-thickness  rotator cuff tear. This was performed in December of last year.  She has  resisted having arthroscopic treatment for the shoulder and has had several  subacromial steroid injections but I finally strongly recommended to her  that she have the arthroscopic procedure to try and prevent full-thickness  tear. Because of increasing pain recently, she has consented to the surgery.  See operative description below for additional details.   DESCRIPTION OF PROCEDURE:  Interscalene block.  Initially because it was an  arthroscopic procedure did not use prophylactic antibiotics.  Beach chair  position on the Schlein frame, right shoulder girdle was prepped with  Duraprep, draped in a sterile field.  The anatomy of the shoulder joint was  marked  out.  I injected the subacromial space and lateral and posterior  portals with 0.5% Marcaine with adrenaline for hemostasis. Through a  posterior soft spot portal, I atraumatically entered the glenohumeral joint.  It was not apparent at this time that she had any full-thickness rotator  cuff tear.  The degenerated labrum and the underneath surface of the rotator  cuff tear were identified and because of this, I advanced the scope to the  anterior capsule and used the switching stick, made an anterior incision and  over the switching stick used a metal cannula which I used to enter the  joint followed by a 4.2 shaver.  I then debrided down the labrum, the biceps  tendon and the underneath surface of the rotator cuff. I then redirected the  scope into the subacromial space and it was at this point that we noted what  appeared to be a fairly significant tear of the rotator cuff in line with  the fibers with no detachment from the greater  tuberosity.  I then felt that  the best way to manage the case would be to do the arthroscopic subacromial  decompression and then repair the rotator cuff with a mini incision.  She  did have a significant impingement problem. I first used the ArthroCare  vaporizer to remove soft tissue from the underneath surface of the acromion  and around the perimeter anteriorly and then followed this with the 4-0 oval  bur and performed a wide decompression as documented on pictures.  I then  used a spinal needle to localize the area of the tear and made a small  vertical incision at this point which I enlarged slightly as needed to give  me access to and for visualization purposes I had to remove a little bit of  the deltoid off the residual anterior acromion and also removed a little  underneath surface of the anterior acromion with a small rongeur for  exposure.  The tear was about 3 cm in length and just posterior to the level  of the greater tuberosity.  The rotator  cuff at the greater tuberosity was  abraded but not torn.  There was a tongue of rotator cuff between the two  flaps and I felt that the tear would best be handled by a side to side  repair incorporating the tongue without any tension on it so that there  would be no danger of it pulling through it.  I then performed this repair  with multiple interrupted #1 Ethibond and also reinforced slightly what  appeared to be a weak area from abrasion in the superior rotator cuff with  the same.  The wound was then irrigated with sterile saline and prior to the  open repair we gave her a gram of Ancef and will cover her for 24 hours with  it.  I then repaired the vertical incision in the deltoid muscle with  interrupted #1 Vicryl reattaching it to the acromion and also reattached the  partial dissection off the anterior acromion of the fascia with the same.  The subcutaneous tissue was closed with 2-0 Vicryl, skin with Steri-Strips.  The three portals were closed with 4-0 nylon and were covered with Betadine  Adaptic, a dry sterile dressing was applied followed by a shoulder  immobilizer. She tolerated the procedure well and was taken to the recovery  room in satisfactory condition with no known complications.                                               Marlowe Kays, M.D.    JA/MEDQ  D:  11/06/2003  T:  11/06/2003  Job:  [id number]

## 2010-10-02 NOTE — Consult Note (Signed)
[place]  Patient:    [name], [name]                     MRN: [id number] Proc. Date: 10/22/99 Adm. Date:  [id number] Attending:  Julian Hy CC:         Tera Mater. Evlyn Kanner, M.D.                          Consultation Report  A 77 year old woman diagnosed with low-grade non-Hodgkins lymphoma in 1988. She had splenomegaly as the only sign of disease initially.  She underwent splenectomy at [facility].  She had her first relapse with retroperitoneal lymphadenopathy in December 1993.  She was treated with fludarabine chemotherapy plus prednisone and achieved a remission.  She had her second relapse in August 1997, again successfully induced into remission with fludarabine.  A third relapse occurred in September 1998, and she went into a complete remission with Rituxan monoclonal antibody treatment.  Her most recent followup in our office was August 13, 1999.  CT scan of the chest, abdomen, and pelvis done at that time still confirmed remission status.  She now presents with a two-week history of increasing pain in her low back, progressing to her right shoulder and neck.  An outpatient evaluation was initiated by Dr. Ardyth Harps.  Regular x-rays of the spine were unremarkable. Laboratory studies showed expected hypercalcemia in the range of 11.5 to 12. A parathyroid hormone was low.  CT scan of the abdomen and pelvis done at [facility] yesterday shows no lymphadenopathy.  Repeat lumbosacral spine films again negative.  The patient has now developed rapidly progressive confusion and increased pain and was admitted for further evaluation.  PHYSICAL EXAMINATION:  GENERAL:  A mildly confused but cooperative elderly woman who appears acutely ill.  VITAL SIGNS:  Initial vital signs show blood pressure 128/70, pulse 102, respirations 20, temperature 96.3.  SKIN, HAIR, AND NAILS:  Normal.  HEENT:  Pupils reactive to light.  Pharynx  without erythema or exudate.  NECK:  Supple.  No thyromegaly.  LUNGS:  Clear, resonant to percussion.  CARDIAC:  Regular cardiac rhythm without murmur.  BREASTS:  There are no breast masses.  LYMPHADENOPATHY:  None.  ABDOMEN:  No mass, tenderness, or organomegaly.  EXTREMITIES:  No edema, no calf tenderness.  NEUROLOGIC:  Mildly confused.  No focal neurologic deficit.  No cranial nerve palsy.  Motor strength 5/5, reflexes 2+ and symmetric.  PERTINENT LABORATORY DATA:  Hemoglobin 9.2 compared with 13 done in March. MCV 89, white count 4700, platelets 162,000.  Pro time 38.2, INR 7.8 on Coumadin.  BUN 48, creatinine 1.9, alkaline phosphatase 218, SGOT 48, calcium 12.8, albumin 2.6.  Corrected calcium 13.6.  Digoxin level is 2.9.  Sodium 131, potassium 5.1, bicarbonate 30, random glucose 141.  IMPRESSION:  Malignant type of hypercalcemia, etiology unclear at present time.  This is an unusual presentation for relapsed lymphoma.  However, given her longstanding lymphoma history, I would not be surprised if ultimately this turned out to be a lymphoma relapse.  I agree we need to consider other possibilities in our differential including multiple myeloma or a second primary solid tumor.  RECOMMENDATIONS:  I agree with checking serum and urine protein electrophoresis to rule out myeloma.  I would obtain a skeletal bone survey also in this regard.  I would get an MRI of her spine to look for bone marrow abnormalities in  view of the negative regular x-rays.  I agree with aggressive hydration and calcium-lowering agents.  Thank you for this consultation.  I will follow with you. DD:  10/22/99 TD:  10/22/99 Job: [id number]

## 2010-10-02 NOTE — Procedures (Signed)
. [facility]  Patient:    [name], [name]                     MRN: [id number] Proc. Date: 11/20/99 Adm. Date:  [id number] Attending:  Ok Anis CC:         [facility]             [address]                           Procedure Report  HISTORY:  [name] is a 77 year old white female with a history of recurrence of lymphoma, a high-grade non-Hodgkins lymphoma.  The patient has been set up for intrathecal DepoCyt to be administered today.  Complications of the above procedure were explained to the patient, including bleeding, nerve damage, headache, infection, and arachnoiditis.  The patient understood these risks and consented to the procedure.  DESCRIPTION OF PROCEDURE:  The patient was placed in the fetal position on the right side.  The low back was cleaned with Betadine solution, and approximately 2 cc of 1% Xylocaine was used as a local anesthetic.  A 20-gauge spinal needle was inserted in the L3-4 interspace and approximately 12 cc of clear, colorless spinal fluid was removed for testing.  Opening pressure was 70 mmH2O.  Tube #1 was sent for CBC, differential.  Tube #2 was sent for total protein and VDRL.  Tube #3 was sent for cytology.  No complications to the above procedure were noted.  Following removal of spinal fluid, the chemotherapy was administered over 12 minutes with slow, intermittent infusion.  The patient tolerated this well without discomfort.  The needle was withdrawn, and the patient was kept flat for two hours. DD:  11/20/99 TD:  11/20/99 Job: [id number]

## 2010-10-02 NOTE — H&P (Signed)
[place]  Patient:    [name], [name]                     MRN: [id number] Adm. Date:  [id number] Attending:  Ok Anis CC:         Tera Mater. Evlyn Kanner, M.D.                         History and Physical  HISTORY OF PRESENT ILLNESS:  A 77 year old woman with a longstanding history of low-grade non-Hodgkins lymphoma initially diagnosed in 1998, status post splenectomy at that time.  She has had a number of recurrences over the years treated with chemotherapy and most recently with immunotherapy with Rituxan antibody in September 1998.  She presented in June of this year with sudden onset of hypercalcemia and bone pain.  She was found to have a conversion to a high-grade non-Hodgkins lymphoma with disease limited to but heavily involving her bone marrow.  Please see recent H&P and discharge summaries for details.  She was started on a program of combined chemo/immunotherapy with CHOP plus Rituxan.  She was hospitalized last week for her second cycle. During that admission, I began prophylactic intrathecal chemotherapy and she received one dose of DepoCyt on November 19, 1999.  She initially seemed to tolerate this well.  She was observed over the weekend and sent home Monday evening.  Over the last 24 hours she has developed persistent spinal-type headache only relieved by staying motionless in the supine position.  She has not been able to eat or drink.  I prescribed Decadron by mouth to be taken at home for symptoms of arachnoiditis.  However, she promptly threw up when she tried to take the medication.  She is admitted now for further evaluation.  PAST MEDICAL HISTORY:  History includes ischemic cardiac disease, intermittent atrial fibrillation, essential hypertension, previous hysterectomy, appendectomy, hemorrhoidectomy.  CURRENT MEDICATIONS: 1. Norvasc 5 mg. 2. Lanoxin 0.125 mg. 3. Septra DS one q.d. to prevent PCP. 4. Compazine  10 mg q.6h. p.r.n. nausea. 5. Darvocet one q.4h. p.r.n. pain.  ALLERGIES: PENICILLIN.  FAMILY HISTORY, SOCIAL HISTORY:  See recent H&Ps.  REVIEW OF SYSTEMS:  She denied any chest pain, chest pressure, palpitations, abdominal pain, no constipation, diarrhea, melena, hematuria.  PHYSICAL EXAMINATION:  GENERAL:   A frail, elderly woman.  VITAL SIGNS:  Pulse is 73 and regular, blood pressure 155/89, respirations 20, temperature 97.   Weight 105 pounds.  SKIN, HAIR AND NAILS:  Normal.  HEENT:  Pupils reactive to light.  Discs sharp, vessels normal, no hemorrhages.   Pharynx normal no exudate.  NECK:  Supple.  No thyromegaly.  Carotids 2+.  LUNGS:  Clear.  Resonant to percussion.  Regular cardiac rhythm without murmur.  No lymphadenopathy.  ABDOMEN:  No mass, tenderness or organomegaly in the abdomen.  EXTREMITIES:  No edema.  No calf tenderness.  NEUROLOGICAL EXAM:  Mental status intact.  Cranial nerves intact. Coordination normal.  Motor strength 5/5.  Reflexes 1+ and symmetric.  IMPRESSION: 1. 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  This can be a    life threatening situation.  Since she is unable to take steroids by mouth    at home, hospitalization is indicated for parenteral steroids. 2. Dehydration.  Her hemoglobin in our office today is 14.3 g.  She required    a transfusion prior to discharge for hemoglobin 8.7 g.  I would only expect  hemoglobin to go up to about 11.  The fact that it is 14 suggests that she    is hemoconcentrated. 3. Neutropenia.  Total white count today 1100.  Neutrophils only 900.  She is    at risk for infection and is now only a few days post her chemotherapy and    I anticipate further fall in her white count and her platelet count. 4. Histologic conversion to high-grade non-Hodgkins lymphoma.  Guarded    prognosis.  Cytogenetic studies have shown an 814 translocation. 5. Essential hypertension. 6. Ischemic cardiac  disease. 7. Chronic atrial fibrillation. 8. Status post splenectomy in 1988.  PLAN:  Parenteral steroids, hydration, parenteral antibiotics if any fever, Neupogen to accelerate white count recovery. DD:  11/25/99 TD:  11/25/99 Job: [id number]

## 2010-11-04 ENCOUNTER — Encounter (HOSPITAL_BASED_OUTPATIENT_CLINIC_OR_DEPARTMENT_OTHER): Payer: Medicare Other | Admitting: Oncology

## 2010-11-04 ENCOUNTER — Other Ambulatory Visit: Payer: Self-pay | Admitting: Oncology

## 2010-11-04 DIAGNOSIS — C8589 Other specified types of non-Hodgkin lymphoma, extranodal and solid organ sites: Secondary | ICD-10-CM

## 2010-11-04 LAB — CBC WITH DIFFERENTIAL/PLATELET
BASO%: 0.3 % (ref 0.0–2.0)
Basophils Absolute: 0 10*3/uL (ref 0.0–0.1)
EOS%: 2.4 % (ref 0.0–7.0)
HCT: 42 % (ref 34.8–46.6)
LYMPH%: 27.6 % (ref 14.0–49.7)
MCH: 30 pg (ref 25.1–34.0)
MCHC: 32.8 g/dL (ref 31.5–36.0)
MCV: 91.2 fL (ref 79.5–101.0)
MONO%: 13.4 % (ref 0.0–14.0)
NEUT%: 56.3 % (ref 38.4–76.8)
Platelets: 285 10*3/uL (ref 145–400)

## 2010-11-04 LAB — COMPREHENSIVE METABOLIC PANEL
ALT: 18 U/L (ref 0–35)
AST: 39 U/L — ABNORMAL HIGH (ref 0–37)
BUN: 27 mg/dL — ABNORMAL HIGH (ref 6–23)
Creatinine, Ser: 0.88 mg/dL (ref 0.50–1.10)
Total Bilirubin: 0.6 mg/dL (ref 0.3–1.2)

## 2011-03-02 ENCOUNTER — Encounter (HOSPITAL_BASED_OUTPATIENT_CLINIC_OR_DEPARTMENT_OTHER): Payer: Medicare Other | Admitting: Oncology

## 2011-03-02 ENCOUNTER — Other Ambulatory Visit: Payer: Self-pay | Admitting: Oncology

## 2011-03-02 DIAGNOSIS — C8589 Other specified types of non-Hodgkin lymphoma, extranodal and solid organ sites: Secondary | ICD-10-CM

## 2011-03-02 DIAGNOSIS — C8590 Non-Hodgkin lymphoma, unspecified, unspecified site: Secondary | ICD-10-CM

## 2011-03-02 DIAGNOSIS — M549 Dorsalgia, unspecified: Secondary | ICD-10-CM

## 2011-03-02 LAB — COMPREHENSIVE METABOLIC PANEL
ALT: 16 U/L (ref 0–35)
Albumin: 3.9 g/dL (ref 3.5–5.2)
BUN: 26 mg/dL — ABNORMAL HIGH (ref 6–23)
CO2: 25 mEq/L (ref 19–32)
Calcium: 10 mg/dL (ref 8.4–10.5)
Chloride: 99 mEq/L (ref 96–112)
Creatinine, Ser: 0.87 mg/dL (ref 0.50–1.10)

## 2011-03-02 LAB — CBC WITH DIFFERENTIAL/PLATELET
Basophils Absolute: 0.1 10*3/uL (ref 0.0–0.1)
EOS%: 3.4 % (ref 0.0–7.0)
HGB: 13.9 g/dL (ref 11.6–15.9)
MCH: 30.6 pg (ref 25.1–34.0)
NEUT#: 4.2 10*3/uL (ref 1.5–6.5)
RDW: 14.8 % — ABNORMAL HIGH (ref 11.2–14.5)
lymph#: 2.4 10*3/uL (ref 0.9–3.3)

## 2011-03-02 LAB — LACTATE DEHYDROGENASE: LDH: 160 U/L (ref 94–250)

## 2011-03-07 ENCOUNTER — Ambulatory Visit (HOSPITAL_COMMUNITY)
Admission: RE | Admit: 2011-03-07 | Discharge: 2011-03-07 | Disposition: A | Discharge status: Home / Self Care | Payer: Medicare Other | Source: Ambulatory Visit | Attending: Oncology | Admitting: Oncology

## 2011-03-07 ENCOUNTER — Other Ambulatory Visit (HOSPITAL_COMMUNITY): Payer: Medicare Other

## 2011-03-07 DIAGNOSIS — M4000 Postural kyphosis, site unspecified: Secondary | ICD-10-CM | POA: Insufficient documentation

## 2011-03-07 DIAGNOSIS — M549 Dorsalgia, unspecified: Secondary | ICD-10-CM | POA: Insufficient documentation

## 2011-03-07 DIAGNOSIS — C8590 Non-Hodgkin lymphoma, unspecified, unspecified site: Secondary | ICD-10-CM

## 2011-03-07 DIAGNOSIS — M47814 Spondylosis without myelopathy or radiculopathy, thoracic region: Secondary | ICD-10-CM | POA: Insufficient documentation

## 2011-03-07 DIAGNOSIS — M48061 Spinal stenosis, lumbar region without neurogenic claudication: Secondary | ICD-10-CM | POA: Insufficient documentation

## 2011-03-07 DIAGNOSIS — C8589 Other specified types of non-Hodgkin lymphoma, extranodal and solid organ sites: Secondary | ICD-10-CM | POA: Insufficient documentation

## 2011-03-07 MED ORDER — GADOBENATE DIMEGLUMINE 529 MG/ML IV SOLN
9.0000 mL | Freq: Once | INTRAVENOUS | Status: AC | PRN
  Administered 2011-03-07: 9 mL via INTRAVENOUS

## 2011-05-20 ENCOUNTER — Telehealth: Payer: Self-pay | Admitting: Oncology

## 2011-05-20 NOTE — Telephone Encounter (Signed)
called pts home lmovm that her appts in jan2013 were r/s to march 2013 and to rtn call to confirm appts

## 2011-05-31 ENCOUNTER — Telehealth: Payer: Self-pay | Admitting: Oncology

## 2011-05-31 NOTE — Telephone Encounter (Signed)
Talked to pt, gave her appt date for lab 07/16/11 and see Md after a few days, pt is ware of appt

## 2011-06-01 ENCOUNTER — Other Ambulatory Visit: Payer: Medicare Other | Admitting: Lab

## 2011-07-16 ENCOUNTER — Other Ambulatory Visit (HOSPITAL_BASED_OUTPATIENT_CLINIC_OR_DEPARTMENT_OTHER): Payer: Medicare Other | Admitting: Lab

## 2011-07-16 DIAGNOSIS — C8589 Other specified types of non-Hodgkin lymphoma, extranodal and solid organ sites: Secondary | ICD-10-CM

## 2011-07-16 LAB — CBC WITH DIFFERENTIAL/PLATELET
BASO%: 1.3 % (ref 0.0–2.0)
Eosinophils Absolute: 0.3 10*3/uL (ref 0.0–0.5)
MONO#: 0.8 10*3/uL (ref 0.1–0.9)
NEUT#: 4.3 10*3/uL (ref 1.5–6.5)
RBC: 4.45 10*6/uL (ref 3.70–5.45)
RDW: 14.3 % (ref 11.2–14.5)
WBC: 8.2 10*3/uL (ref 3.9–10.3)

## 2011-07-17 LAB — COMPREHENSIVE METABOLIC PANEL
ALT: 17 U/L (ref 0–35)
Albumin: 3.9 g/dL (ref 3.5–5.2)
Alkaline Phosphatase: 63 U/L (ref 39–117)
CO2: 27 mEq/L (ref 19–32)
Glucose, Bld: 103 mg/dL — ABNORMAL HIGH (ref 70–99)
Potassium: 4.2 mEq/L (ref 3.5–5.3)
Sodium: 138 mEq/L (ref 135–145)
Total Protein: 6.8 g/dL (ref 6.0–8.3)

## 2011-07-17 LAB — SEDIMENTATION RATE: Sed Rate: 4 mm/hr (ref 0–22)

## 2011-07-19 ENCOUNTER — Ambulatory Visit: Payer: Medicare Other | Admitting: Oncology

## 2011-07-23 ENCOUNTER — Telehealth: Payer: Self-pay | Admitting: Oncology

## 2011-07-23 ENCOUNTER — Ambulatory Visit (HOSPITAL_BASED_OUTPATIENT_CLINIC_OR_DEPARTMENT_OTHER): Payer: Medicare Other | Admitting: Oncology

## 2011-07-23 ENCOUNTER — Encounter: Payer: Self-pay | Admitting: Oncology

## 2011-07-23 DIAGNOSIS — Z87898 Personal history of other specified conditions: Secondary | ICD-10-CM

## 2011-07-23 DIAGNOSIS — M51379 Other intervertebral disc degeneration, lumbosacral region without mention of lumbar back pain or lower extremity pain: Secondary | ICD-10-CM

## 2011-07-23 DIAGNOSIS — M47816 Spondylosis without myelopathy or radiculopathy, lumbar region: Secondary | ICD-10-CM | POA: Insufficient documentation

## 2011-07-23 DIAGNOSIS — C8297 Follicular lymphoma, unspecified, spleen: Secondary | ICD-10-CM

## 2011-07-23 DIAGNOSIS — E785 Hyperlipidemia, unspecified: Secondary | ICD-10-CM | POA: Insufficient documentation

## 2011-07-23 DIAGNOSIS — C8590 Non-Hodgkin lymphoma, unspecified, unspecified site: Secondary | ICD-10-CM

## 2011-07-23 DIAGNOSIS — Z9221 Personal history of antineoplastic chemotherapy: Secondary | ICD-10-CM

## 2011-07-23 DIAGNOSIS — I10 Essential (primary) hypertension: Secondary | ICD-10-CM

## 2011-07-23 DIAGNOSIS — M5137 Other intervertebral disc degeneration, lumbosacral region: Secondary | ICD-10-CM

## 2011-07-23 DIAGNOSIS — M810 Age-related osteoporosis without current pathological fracture: Secondary | ICD-10-CM | POA: Insufficient documentation

## 2011-07-23 HISTORY — DX: Age-related osteoporosis without current pathological fracture: M81.0

## 2011-07-23 HISTORY — DX: Non-Hodgkin lymphoma, unspecified, unspecified site: C85.90

## 2011-07-23 HISTORY — DX: Essential (primary) hypertension: I10

## 2011-07-23 HISTORY — DX: Hyperlipidemia, unspecified: E78.5

## 2011-07-23 HISTORY — DX: Spondylosis without myelopathy or radiculopathy, lumbar region: M47.816

## 2011-07-23 NOTE — Telephone Encounter (Signed)
gv pt appt for sept2013 

## 2011-07-25 NOTE — Progress Notes (Signed)
Hematology and Oncology Follow Up Visit  [name] [id number] 1922/11/08 78 y.o. 07/25/2011 12:06 PM   Principle Diagnosis: Encounter Diagnoses  Name Primary?  . Lymphoma, high grade Yes  . Lymphoma, nodular, spleen   . Benign essential HTN   . Hyperlipidemia   . Osteoporosis, post-menopausal   . DJD (degenerative joint disease), lumbar      Interim History:   [name] is now 78 years old.  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  She underwent splenectomy.  She progressed to lymph nodes in 1993.  She received intermittent chemotherapy until June 2001 when she had a Richter-type conversion to high-grade lymphoma presenting with diffuse spine pain and hypercalcemia.  Disease limited to the bone and bone marrow.  She was treated with CHOP/Rituxan and achieved a complete response.  She went on to consolidation therapy with Zevalin radioimmunotherapy in January 2002 at [facility].  She has remained in a stable remission now out over 12 years from completion of treatments.    At time of her most recent office visit here in October 2012. She was having increased back pain. I went ahead and get an MRI scan of the lumbar and thoracic spine on 03/07/2011. This showed degenerative changes but no evidence for recurrent lymphoma. The pain has subsided at this point. She always runs a borderline elevated calcium. She has had no interim medical problems  Medications: reviewed  Allergies:  Allergies  Allergen Reactions  . Penicillins   . Shellfish-Derived Products     Review of Systems: Constitutional:   No constitutional symptoms Respiratory: No cough or dyspnea Cardiovascular:  No chest pain or palpitations Gastrointestinal: No change in bowel habit Genito-Urinary: No urinary tract symptoms. No vaginal bleeding. Musculoskeletal: Chronic arthritis of the spine with associated pain. Neurologic: No headache or change in  vision. Skin: No rash or ecchymosis. Remaining ROS negative.  Physical Exam: There were no vitals taken for this visit. Wt Readings from Last 3 Encounters:  No data found for Wt     General appearance: Frail Caucasian woman always well dressed HENNT: Pharynx no erythema or exudate Lymph nodes: No cervical supraclavicular or axillary adenopathy Breasts: Not examined Lungs: Clear to auscultation resonant to percussion Heart: Regular rhythm no murmur Abdomen: Soft nontender status post splenectomy Extremities: No edema no calf tenderness Vascular: No cyanosis Neurologic: Motor strength 5 over 5 reflexes 1+ symmetric Skin: No rash or ecchymosis  Lab Results: Lab Results  Component Value Date   WBC 8.2 07/16/2011   HGB 13.4 07/16/2011   HCT 40.8 07/16/2011   MCV 91.8 07/16/2011   PLT 285 07/16/2011     Chemistry      Component Value Date/Time   NA 138 07/16/2011 1338   K 4.2 07/16/2011 1338   CL 101 07/16/2011 1338   CO2 27 07/16/2011 1338   BUN 26* 07/16/2011 1338   CREATININE 0.91 07/16/2011 1338      Component Value Date/Time   CALCIUM 10.0 07/16/2011 1338   CALCIUM 11.1* 06/09/2010 1610   ALKPHOS 63 07/16/2011 1338   AST 37 07/16/2011 1338   ALT 17 07/16/2011 1338   BILITOT 0.5 07/16/2011 1338       Radiological Studies: See discussion above   Impression and Plan: #1. Initial low-grade splenic lymphoma, progression to low-grade nodular lymphoma in the lymph nodes, then ultimate Richter's conversion to high-grade non-Hodgkin's lymphoma with bone and bone marrow involvement only. Complete response to induction chemotherapy  with the durable remission and likely cure her of following Zevalin radio immunotherapy given 11 years ago! Plan: Continue intermittent clinical observation. #2.Essential hypertension #3. Hyperlipidemia #4. Chronic borderline hypercalcemia.  #5. Advanced degenerative arthritis of the spine. #6. History of atypical nodular pulmonary infiltrates, negative PPD.     CC:.  Dr. Ardyth Harps   Levert Feinstein, MD 3/10/201312:06 PM

## 2011-09-09 ENCOUNTER — Other Ambulatory Visit: Payer: Self-pay

## 2011-09-22 ENCOUNTER — Other Ambulatory Visit: Payer: Self-pay | Admitting: Endocrinology

## 2011-09-22 DIAGNOSIS — G8929 Other chronic pain: Secondary | ICD-10-CM

## 2011-12-30 ENCOUNTER — Other Ambulatory Visit: Payer: Medicare Other

## 2011-12-30 ENCOUNTER — Ambulatory Visit: Payer: Medicare Other | Admitting: Nurse Practitioner

## 2012-01-24 ENCOUNTER — Ambulatory Visit (HOSPITAL_BASED_OUTPATIENT_CLINIC_OR_DEPARTMENT_OTHER): Payer: Medicare Other | Admitting: Oncology

## 2012-01-24 ENCOUNTER — Other Ambulatory Visit (HOSPITAL_BASED_OUTPATIENT_CLINIC_OR_DEPARTMENT_OTHER): Payer: Medicare Other | Admitting: Lab

## 2012-01-24 ENCOUNTER — Telehealth: Payer: Self-pay | Admitting: Oncology

## 2012-01-24 VITALS — BP 150/78 | HR 58 | Temp 97.0°F | Resp 20 | Wt 94.3 lb

## 2012-01-24 DIAGNOSIS — C8297 Follicular lymphoma, unspecified, spleen: Secondary | ICD-10-CM

## 2012-01-24 DIAGNOSIS — Z87898 Personal history of other specified conditions: Secondary | ICD-10-CM

## 2012-01-24 DIAGNOSIS — C9110 Chronic lymphocytic leukemia of B-cell type not having achieved remission: Secondary | ICD-10-CM

## 2012-01-24 DIAGNOSIS — C8589 Other specified types of non-Hodgkin lymphoma, extranodal and solid organ sites: Secondary | ICD-10-CM

## 2012-01-24 DIAGNOSIS — C8590 Non-Hodgkin lymphoma, unspecified, unspecified site: Secondary | ICD-10-CM

## 2012-01-24 DIAGNOSIS — M479 Spondylosis, unspecified: Secondary | ICD-10-CM

## 2012-01-24 DIAGNOSIS — C8510 Unspecified B-cell lymphoma, unspecified site: Secondary | ICD-10-CM

## 2012-01-24 DIAGNOSIS — I10 Essential (primary) hypertension: Secondary | ICD-10-CM

## 2012-01-24 LAB — COMPREHENSIVE METABOLIC PANEL (CC13)
ALT: 19 U/L (ref 0–55)
BUN: 30 mg/dL — ABNORMAL HIGH (ref 7.0–26.0)
CO2: 28 mEq/L (ref 22–29)
Calcium: 10.6 mg/dL — ABNORMAL HIGH (ref 8.4–10.4)
Creatinine: 1 mg/dL (ref 0.6–1.1)
Total Bilirubin: 0.7 mg/dL (ref 0.20–1.20)

## 2012-01-24 LAB — CBC WITH DIFFERENTIAL/PLATELET
Basophils Absolute: 0.1 10*3/uL (ref 0.0–0.1)
Eosinophils Absolute: 0.2 10*3/uL (ref 0.0–0.5)
HCT: 39.4 % (ref 34.8–46.6)
HGB: 13.6 g/dL (ref 11.6–15.9)
MCH: 29.8 pg (ref 25.1–34.0)
MONO#: 1 10*3/uL — ABNORMAL HIGH (ref 0.1–0.9)
NEUT#: 3.2 10*3/uL (ref 1.5–6.5)
NEUT%: 51.6 % (ref 38.4–76.8)
RDW: 15.4 % — ABNORMAL HIGH (ref 11.2–14.5)
WBC: 6.1 10*3/uL (ref 3.9–10.3)
lymph#: 1.8 10*3/uL (ref 0.9–3.3)

## 2012-01-24 LAB — LACTATE DEHYDROGENASE (CC13): LDH: 203 U/L (ref 125–220)

## 2012-01-24 NOTE — Telephone Encounter (Signed)
Gave pt appt calendar  for lab and MD on 07/24/12

## 2012-01-24 NOTE — Progress Notes (Signed)
Hematology and Oncology Follow Up Visit  FELIX PRATT [id number] November 27, 1922 78 y.o. 01/24/2012 11:51 AM   Principle Diagnosis: Encounter Diagnoses  Name Primary?  . High Grade B-Cell Lymphoma Yes  . Richter'S Syndrome      Interim History:   Followup visit for this now 78 year old woman. 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 She underwent splenectomy. She progressed to lymph nodes in 1993. She received intermittent chemotherapy until June 2001 when she had a Richter-type conversion to high-grade lymphoma presenting with diffuse spine pain and hypercalcemia. Disease limited to the bone and bone marrow. She was treated with CHOP/Rituxan and achieved a complete response. She went on to consolidation therapy with Zevalin radioimmunotherapy in January 2002 at [facility]. She has remained in a stable remission now out over 12 years from completion of treatments.  At time of her office visit here in October 2012, she was having increased back pain. I went ahead and got an MRI scan of the lumbar and thoracic spine on 03/07/2011. This showed degenerative changes but no evidence for recurrent lymphoma. Her pain has subsided back to her baseline level. She reports no new areas of pain.   She has had no interim medical problems. She did scrape her right leg with her fingernail. She has very thin skin and is on Coumadin. She developed a large but superficial hematoma. Her daughter as they are and then put a Tegaderm dressing over the wound.   Medications: reviewed  Allergies:  Allergies  Allergen Reactions  . Penicillins   . Shellfish-Derived Products     Review of Systems: Constitutional:   No constitutional symptoms Respiratory: No cough or dyspnea Cardiovascular:  No chest pain or palpitations Gastrointestinal: No change in bowel habit Genito-Urinary: Not questioned Musculoskeletal: See above Neurologic: Daughter reports that  her mother's memory is getting worse and worse over time Skin: See above Remaining ROS negative.  Physical Exam: Blood pressure 150/78, pulse 58, temperature 97 F (36.1 C), temperature source Oral, resp. rate 20, weight 94 lb 4.8 oz (42.774 kg). Wt Readings from Last 3 Encounters:  01/24/12 94 lb 4.8 oz (42.774 kg)     General appearance: Frail, thin, Caucasian woman HENNT: Pharynx no erythema or exudate Lymph nodes: No adenopathy Breasts: Not examined today Lungs: Clear to auscultation resonant to percussion Heart: Regular rhythm no murmur Abdomen: Soft nontender status post splenectomy Extremities: No edema no calf tenderness. Ecchymosis over the right shin Vascular: No cyanosis Neurologic: Motor strength is 5 over 5. Reflexes 1+ symmetric. Poor recent memory. Skin: See above  Lab Results: Lab Results  Component Value Date   WBC 6.1 01/24/2012   HGB 13.6 01/24/2012   HCT 39.4 01/24/2012   MCV 86.4 01/24/2012   PLT 325 01/24/2012     Chemistry      Component Value Date/Time   NA 138 07/16/2011 1338   K 4.2 07/16/2011 1338   CL 101 07/16/2011 1338   CO2 27 07/16/2011 1338   BUN 26* 07/16/2011 1338   CREATININE 0.91 07/16/2011 1338      Component Value Date/Time   CALCIUM 10.0 07/16/2011 1338   CALCIUM 11.1* 06/09/2010 1610   ALKPHOS 63 07/16/2011 1338   AST 37 07/16/2011 1338   ALT 17 07/16/2011 1338   BILITOT 0.5 07/16/2011 1338     Chemistry profile today pending    Impression and Plan: #1. Initial low-grade splenic lymphoma, progression to low-grade nodular lymphoma in the lymph nodes,  then ultimate Richter's conversion to high-grade non-Hodgkin's lymphoma with bone and bone marrow involvement only presenting with bone pain and hypercalcemia. Complete response to induction chemotherapy with CHOP then durable remission and likely cure her of following consolidation with Zevalin radio immunotherapy given 12 years ago!  Plan: Continue intermittent clinical observation.  #2.Essential  hypertension  #3. Hyperlipidemia  #4. Chronic borderline hypercalcemia.  #5. Advanced degenerative arthritis of the spine.  #6. History of atypical nodular pulmonary infiltrates, negative PPD.    CC:.    Levert Feinstein, MD 9/9/201311:51 AM

## 2012-07-24 ENCOUNTER — Other Ambulatory Visit: Payer: Self-pay | Admitting: *Deleted

## 2012-07-24 ENCOUNTER — Other Ambulatory Visit: Payer: Medicare Other | Admitting: Lab

## 2012-07-24 ENCOUNTER — Ambulatory Visit: Payer: Medicare Other | Admitting: Oncology

## 2012-07-26 ENCOUNTER — Telehealth: Payer: Self-pay | Admitting: *Deleted

## 2012-07-26 NOTE — Telephone Encounter (Signed)
sw pt appt d/t was given for 11/24/2012 @ 3pm.

## 2012-09-28 ENCOUNTER — Telehealth: Payer: Self-pay | Admitting: Oncology

## 2012-09-28 NOTE — Telephone Encounter (Signed)
Gave pt appt for May 2014 lab and ML, pt FTKA last March and pt 's daughter requested that they be seen with ML, notified nurse

## 2012-10-04 ENCOUNTER — Telehealth: Payer: Self-pay | Admitting: Oncology

## 2012-10-04 ENCOUNTER — Other Ambulatory Visit (HOSPITAL_BASED_OUTPATIENT_CLINIC_OR_DEPARTMENT_OTHER): Payer: Medicare Other | Admitting: Lab

## 2012-10-04 ENCOUNTER — Ambulatory Visit (HOSPITAL_BASED_OUTPATIENT_CLINIC_OR_DEPARTMENT_OTHER): Payer: Medicare Other | Admitting: Nurse Practitioner

## 2012-10-04 VITALS — BP 153/71 | HR 80 | Temp 97.3°F | Resp 18 | Wt 92.3 lb

## 2012-10-04 DIAGNOSIS — C8510 Unspecified B-cell lymphoma, unspecified site: Secondary | ICD-10-CM

## 2012-10-04 DIAGNOSIS — C9110 Chronic lymphocytic leukemia of B-cell type not having achieved remission: Secondary | ICD-10-CM

## 2012-10-04 DIAGNOSIS — M479 Spondylosis, unspecified: Secondary | ICD-10-CM

## 2012-10-04 DIAGNOSIS — C8589 Other specified types of non-Hodgkin lymphoma, extranodal and solid organ sites: Secondary | ICD-10-CM

## 2012-10-04 DIAGNOSIS — C8590 Non-Hodgkin lymphoma, unspecified, unspecified site: Secondary | ICD-10-CM

## 2012-10-04 DIAGNOSIS — I10 Essential (primary) hypertension: Secondary | ICD-10-CM

## 2012-10-04 LAB — CBC WITH DIFFERENTIAL/PLATELET
Basophils Absolute: 0 10*3/uL (ref 0.0–0.1)
Eosinophils Absolute: 0.2 10*3/uL (ref 0.0–0.5)
HCT: 36.9 % (ref 34.8–46.6)
HGB: 12.4 g/dL (ref 11.6–15.9)
LYMPH%: 23.2 % (ref 14.0–49.7)
MCV: 90.4 fL (ref 79.5–101.0)
MONO#: 1 10*3/uL — ABNORMAL HIGH (ref 0.1–0.9)
MONO%: 16.3 % — ABNORMAL HIGH (ref 0.0–14.0)
NEUT#: 3.3 10*3/uL (ref 1.5–6.5)
Platelets: 224 10*3/uL (ref 145–400)
RBC: 4.08 10*6/uL (ref 3.70–5.45)
WBC: 5.9 10*3/uL (ref 3.9–10.3)

## 2012-10-04 LAB — MORPHOLOGY: PLT EST: ADEQUATE

## 2012-10-04 LAB — COMPREHENSIVE METABOLIC PANEL (CC13)
ALT: 17 U/L (ref 0–55)
AST: 33 U/L (ref 5–34)
Alkaline Phosphatase: 63 U/L (ref 40–150)
BUN: 24.5 mg/dL (ref 7.0–26.0)
Chloride: 104 mEq/L (ref 98–107)
Creatinine: 0.9 mg/dL (ref 0.6–1.1)
Potassium: 3.9 mEq/L (ref 3.5–5.1)

## 2012-10-04 NOTE — Progress Notes (Signed)
OFFICE PROGRESS NOTE  Interval history:  [name] is an 79 year old woman initially diagnosed with a low-grade non-Hodgkin's lymphoma involving the spleen in 1998 status post splenectomy. She progressed to lymph nodes in 1993 and received intermittent chemotherapy until June 2001 when she had a [place] type conversion to high grade lymphoma presenting with diffuse spine pain and hypercalcemia. Disease was limited to the bone and bone marrow. She was treated with CHOP/Rituxan achieving a complete remission. She went on to consolidation therapy with Zevalin radioimmunotherapy in January 2002 at [facility]. She has remained in a stable remission.  She is seen today for scheduled followup. She denies fevers or sweats. Appetite and weight are stable. She has stable chronic back pain. She takes oxycodone as needed. Her daughter continues to note progressive memory decline. No cough or shortness of breath. No bowel or bladder problems.   Objective: Blood pressure 153/71, pulse 80, temperature 97.3 F (36.3 C), temperature source Oral, resp. rate 18, weight 92 lb 4.8 oz (41.867 kg).  Oropharynx is without thrush or ulceration. No palpable cervical, supraclavicular, axillary or inguinal lymph nodes. Lungs are clear. Regular cardiac rhythm. Abdomen is soft and nontender. No organomegaly. Extremities are without edema.  Lab Results: Lab Results  Component Value Date   WBC 5.9 10/04/2012   HGB 12.4 10/04/2012   HCT 36.9 10/04/2012   MCV 90.4 10/04/2012   PLT 224 10/04/2012    Chemistry:    Chemistry      Component Value Date/Time   NA 139 10/04/2012 1327   NA 138 07/16/2011 1338   K 3.9 10/04/2012 1327   K 4.2 07/16/2011 1338   CL 104 10/04/2012 1327   CL 101 07/16/2011 1338   CO2 26 10/04/2012 1327   CO2 27 07/16/2011 1338   BUN 24.5 10/04/2012 1327   BUN 26* 07/16/2011 1338   CREATININE 0.9 10/04/2012 1327   CREATININE 0.91 07/16/2011 1338      Component Value Date/Time   CALCIUM 9.9 10/04/2012 1327   CALCIUM  10.0 07/16/2011 1338   CALCIUM 11.1* 06/09/2010 1610   ALKPHOS 63 10/04/2012 1327   ALKPHOS 63 07/16/2011 1338   AST 33 10/04/2012 1327   AST 37 07/16/2011 1338   ALT 17 10/04/2012 1327   ALT 17 07/16/2011 1338   BILITOT 0.69 10/04/2012 1327   BILITOT 0.5 07/16/2011 1338       Studies/Results: No results found.  Medications: I have reviewed the patient's current medications.  Assessment/Plan:  1. Initial low-grade splenic lymphoma with progression to low-grade nodular lymphoma in the lymph nodes then Richter's conversion to high-grade non-Hodgkin's lymphoma with bone and bone marrow involvement only presenting with bone pain and hypercalcemia. Treated with CHOP chemotherapy followed by consolidation with Zevalin. 2. Essential hypertension. 3. Hyperlipidemia. 4. Chronic borderline hypercalcemia. 5. Advanced degenerative arthritis of the spine. 6. History of atypical nodular pulmonary infiltrates with negative PPD.  Disposition-Ms. [name] remains in clinical remission from the lymphoma. She will return for a followup visit in 6 months. She knows to contact the office in the interim with any problems.  Plan reviewed with Dr. Cyndie Chime.  Lonna Cobb ANP/GNP-BC

## 2012-10-04 NOTE — Telephone Encounter (Signed)
Gave pt appt for December 2014 lab and MD, date per pt rqst

## 2012-11-24 ENCOUNTER — Ambulatory Visit: Payer: Medicare Other | Admitting: Oncology

## 2012-11-24 ENCOUNTER — Other Ambulatory Visit: Payer: Medicare Other | Admitting: Lab

## 2012-12-07 ENCOUNTER — Inpatient Hospital Stay (HOSPITAL_COMMUNITY)
Admission: EM | Admit: 2012-12-07 | Discharge: 2012-12-11 | DRG: 378 | Disposition: A | Discharge status: Skilled Nursing Facility | Payer: Medicare Other | Attending: Endocrinology | Admitting: Endocrinology

## 2012-12-07 ENCOUNTER — Emergency Department (HOSPITAL_COMMUNITY): Payer: Medicare Other

## 2012-12-07 ENCOUNTER — Encounter (HOSPITAL_COMMUNITY): Payer: Self-pay | Admitting: *Deleted

## 2012-12-07 DIAGNOSIS — I10 Essential (primary) hypertension: Secondary | ICD-10-CM | POA: Diagnosis present

## 2012-12-07 DIAGNOSIS — G589 Mononeuropathy, unspecified: Secondary | ICD-10-CM | POA: Diagnosis present

## 2012-12-07 DIAGNOSIS — Z79899 Other long term (current) drug therapy: Secondary | ICD-10-CM

## 2012-12-07 DIAGNOSIS — Z7901 Long term (current) use of anticoagulants: Secondary | ICD-10-CM

## 2012-12-07 DIAGNOSIS — C8589 Other specified types of non-Hodgkin lymphoma, extranodal and solid organ sites: Secondary | ICD-10-CM | POA: Diagnosis present

## 2012-12-07 DIAGNOSIS — F0390 Unspecified dementia without behavioral disturbance: Secondary | ICD-10-CM | POA: Diagnosis present

## 2012-12-07 DIAGNOSIS — K2970 Gastritis, unspecified, without bleeding: Secondary | ICD-10-CM | POA: Diagnosis present

## 2012-12-07 DIAGNOSIS — K922 Gastrointestinal hemorrhage, unspecified: Secondary | ICD-10-CM

## 2012-12-07 DIAGNOSIS — Z681 Body mass index (BMI) 19 or less, adult: Secondary | ICD-10-CM

## 2012-12-07 DIAGNOSIS — M47817 Spondylosis without myelopathy or radiculopathy, lumbosacral region: Secondary | ICD-10-CM | POA: Diagnosis present

## 2012-12-07 DIAGNOSIS — K920 Hematemesis: Principal | ICD-10-CM | POA: Diagnosis present

## 2012-12-07 DIAGNOSIS — R269 Unspecified abnormalities of gait and mobility: Secondary | ICD-10-CM | POA: Diagnosis present

## 2012-12-07 DIAGNOSIS — I4891 Unspecified atrial fibrillation: Secondary | ICD-10-CM | POA: Diagnosis present

## 2012-12-07 DIAGNOSIS — E441 Mild protein-calorie malnutrition: Secondary | ICD-10-CM | POA: Diagnosis present

## 2012-12-07 DIAGNOSIS — Z880 Allergy status to penicillin: Secondary | ICD-10-CM

## 2012-12-07 DIAGNOSIS — E785 Hyperlipidemia, unspecified: Secondary | ICD-10-CM | POA: Diagnosis present

## 2012-12-07 DIAGNOSIS — E871 Hypo-osmolality and hyponatremia: Secondary | ICD-10-CM | POA: Diagnosis not present

## 2012-12-07 HISTORY — DX: Other specified health status: Z78.9

## 2012-12-07 LAB — OCCULT BLOOD, POC DEVICE: Fecal Occult Bld: POSITIVE — AB

## 2012-12-07 LAB — COMPREHENSIVE METABOLIC PANEL
ALT: 19 U/L (ref 0–35)
BUN: 49 mg/dL — ABNORMAL HIGH (ref 6–23)
CO2: 30 mEq/L (ref 19–32)
Calcium: 10.2 mg/dL (ref 8.4–10.5)
Creatinine, Ser: 0.73 mg/dL (ref 0.50–1.10)
GFR calc Af Amer: 86 mL/min — ABNORMAL LOW (ref >90)
GFR calc non Af Amer: 74 mL/min — ABNORMAL LOW (ref >90)
Glucose, Bld: 148 mg/dL — ABNORMAL HIGH (ref 70–99)

## 2012-12-07 LAB — POCT I-STAT, CHEM 8
Calcium, Ion: 1.31 mmol/L — ABNORMAL HIGH (ref 1.13–1.30)
Glucose, Bld: 138 mg/dL — ABNORMAL HIGH (ref 70–99)
HCT: 39 % (ref 36.0–46.0)
Hemoglobin: 13.3 g/dL (ref 12.0–15.0)
Potassium: 4.5 mEq/L (ref 3.5–5.1)
TCO2: 28 mmol/L (ref 0–100)

## 2012-12-07 LAB — URINE MICROSCOPIC-ADD ON

## 2012-12-07 LAB — URINALYSIS, ROUTINE W REFLEX MICROSCOPIC
Glucose, UA: 250 mg/dL — AB
Ketones, ur: NEGATIVE mg/dL
Protein, ur: NEGATIVE mg/dL
Urobilinogen, UA: 1 mg/dL (ref 0.0–1.0)

## 2012-12-07 LAB — PROTIME-INR
INR: 1.34 (ref 0.00–1.49)
Prothrombin Time: 16.3 seconds — ABNORMAL HIGH (ref 11.6–15.2)

## 2012-12-07 LAB — ABO/RH: ABO/RH(D): O POS

## 2012-12-07 LAB — CBC WITH DIFFERENTIAL/PLATELET
Eosinophils Absolute: 0 10*3/uL (ref 0.0–0.7)
Eosinophils Relative: 0 % (ref 0–5)
HCT: 35.2 % — ABNORMAL LOW (ref 36.0–46.0)
Lymphocytes Relative: 12 % (ref 12–46)
Lymphs Abs: 1 10*3/uL (ref 0.7–4.0)
MCH: 29.8 pg (ref 26.0–34.0)
MCV: 88.2 fL (ref 78.0–100.0)
Monocytes Absolute: 0.5 10*3/uL (ref 0.1–1.0)
Monocytes Relative: 6 % (ref 3–12)
RBC: 3.99 MIL/uL (ref 3.87–5.11)
WBC: 8.9 10*3/uL (ref 4.0–10.5)

## 2012-12-07 LAB — DIGOXIN LEVEL: Digoxin Level: 0.6 ng/mL — ABNORMAL LOW (ref 0.8–2.0)

## 2012-12-07 LAB — TROPONIN I: Troponin I: <0.3 ng/mL (ref <0.30)

## 2012-12-07 MED ORDER — SODIUM CHLORIDE 0.9 % IV SOLN
Freq: Once | INTRAVENOUS | Status: AC
  Administered 2012-12-07: 22:00:00 via INTRAVENOUS

## 2012-12-07 NOTE — ED Notes (Signed)
Pt. Made aware for the need of urine. 

## 2012-12-07 NOTE — H&P (Signed)
[name] is an 79 y.o. female.   Chief Complaint: gi bleed HPI: [name] is an 79 yo female with dementia (started on aricept last week) and CVA,  Lymphoma in remission, and atrial fibrillation on chronic anticoagulation.  She was switched from coumadin to xarelto 20 mg daily one week ago.  She was feeling fine until yesterday. She started bleeding.  She threw up blood .  She has a poor memory. Her daughter says at 6:30 pm she told her she was sick.  She went over and saw bright red blood in toilet. She may have had some stool with blood too.  She had coffee ground emesis upon coming into the ER. She has dropped her hemoglobin 4 gm already with this episode.  She will require admit. She denies pain. No fever.  Appetite has been normal lately.  She normally ambulates with no assistive device.  Past Medical History  Diagnosis Date  . Cancer   . Lymphoma, high grade, non hodgkin's with a Richter transformation, 1988, dr. Cyndie Chime Colon polyps Dementia Atrial fibrillation over 25 years Decreased hearing 07/23/2011  . Benign essential HTN 07/23/2011  . Hyperlipidemia 07/23/2011  . 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 DJD (degenerative joint disease), lumbar 07/23/2011     past surgical history:  Tonsillectomy,  Splenectomy,  Appendectomy,  Total abdominal hysterectomy, colectomy, lumpectomy,  Shoulder surgery, hemorrhoidectomy, inguinal hernia repair.   family history:  Father had heart dz Social History:  has no tobacco, alcohol, and drug history on file.  Lives alone very close to her daughter in [place].  Widow 2011 after 66 yrs,  Daughter [name],  And Diana Leriche, 4 GC, no ggc.  Allergies:  Allergies  Allergen Reactions  . Penicillins   . Shellfish-Derived Products robaxin     Meds:  xarelto 20 mg daily aricept 5 mg daily lyrica 75 mg in a.m. And 100 mg in p.m.  For nerve pain in back Lanoxin0.125 mg daily Tylenol arthritis prn Fish oil Atenolol 25 mg qd Prenatal  mvi Amlodipine 5 mg daily Fenofibrate 160 mg daily Stool softener prn Hydrocodone prn    Results for orders placed during the hospital encounter of 12/07/12 (from the past 48 hour(s))  CBC WITH DIFFERENTIAL     Status: Abnormal   Collection Time    12/07/12  8:38 PM      Result Value Range   WBC 8.9  4.0 - 10.5 K/uL   RBC 3.99  3.87 - 5.11 MIL/uL   Hemoglobin 11.9 (*) 12.0 - 15.0 g/dL   HCT 96.0 (*) 45.4 - 09.8 %   MCV 88.2  78.0 - 100.0 fL   MCH 29.8  26.0 - 34.0 pg   MCHC 33.8  30.0 - 36.0 g/dL   RDW 11.9  14.7 - 82.9 %   Platelets 291  150 - 400 K/uL   Neutrophils Relative % 83 (*) 43 - 77 %   Neutro Abs 7.4  1.7 - 7.7 K/uL   Lymphocytes Relative 12  12 - 46 %   Lymphs Abs 1.0  0.7 - 4.0 K/uL   Monocytes Relative 6  3 - 12 %   Monocytes Absolute 0.5  0.1 - 1.0 K/uL   Eosinophils Relative 0  0 - 5 %   Eosinophils Absolute 0.0  0.0 - 0.7 K/uL   Basophils Relative 0  0 - 1 %   Basophils Absolute 0.0  0.0 - 0.1 K/uL  COMPREHENSIVE METABOLIC PANEL     Status: Abnormal  Collection Time    12/07/12  8:38 PM      Result Value Range   Sodium 134 (*) 135 - 145 mEq/L   Potassium 4.3  3.5 - 5.1 mEq/L   Chloride 98  96 - 112 mEq/L   CO2 30  19 - 32 mEq/L   Glucose, Bld 148 (*) 70 - 99 mg/dL   BUN 49 (*) 6 - 23 mg/dL   Creatinine, Ser 1.61  0.50 - 1.10 mg/dL   Calcium 09.6  8.4 - 04.5 mg/dL   Total Protein 7.2  6.0 - 8.3 g/dL   Albumin 2.9 (*) 3.5 - 5.2 g/dL   AST 40 (*) 0 - 37 U/L   ALT 19  0 - 35 U/L   Alkaline Phosphatase 46  39 - 117 U/L   Total Bilirubin 0.5  0.3 - 1.2 mg/dL   GFR calc non Af Amer 74 (*) >90 mL/min   GFR calc Af Amer 86 (*) >90 mL/min   Comment:            The eGFR has been calculated     using the CKD EPI equation.     This calculation has not been     validated in all clinical     situations.     eGFR's persistently     <90 mL/min signify     possible Chronic Kidney Disease.  PROTIME-INR     Status: Abnormal   Collection Time    12/07/12   8:38 PM      Result Value Range   Prothrombin Time 16.3 (*) 11.6 - 15.2 seconds   INR 1.34  0.00 - 1.49  TROPONIN I     Status: None   Collection Time    12/07/12  8:38 PM      Result Value Range   Troponin I <0.30  <0.30 ng/mL   Comment:            Due to the release kinetics of cTnI,     a negative result within the first hours     of the onset of symptoms does not rule out     myocardial infarction with certainty.     If myocardial infarction is still suspected,     repeat the test at appropriate intervals.  TYPE AND SCREEN     Status: None   Collection Time    12/07/12  8:38 PM      Result Value Range   ABO/RH(D) O POS     Antibody Screen NEG     Sample Expiration 12/10/2012    DIGOXIN LEVEL     Status: Abnormal   Collection Time    12/07/12  8:38 PM      Result Value Range   Digoxin Level 0.6 (*) 0.8 - 2.0 ng/mL  ABO/RH     Status: None   Collection Time    12/07/12  8:38 PM      Result Value Range   ABO/RH(D) O POS    OCCULT BLOOD, POC DEVICE     Status: Abnormal   Collection Time    12/07/12  9:26 PM      Result Value Range   Fecal Occult Bld POSITIVE (*) NEGATIVE  POCT I-STAT, CHEM 8     Status: Abnormal   Collection Time    12/07/12  9:47 PM      Result Value Range   Sodium 138  135 - 145 mEq/L   Potassium 4.5  3.5 -  5.1 mEq/L   Chloride 100  96 - 112 mEq/L   BUN 54 (*) 6 - 23 mg/dL   Creatinine, Ser 1.61  0.50 - 1.10 mg/dL   Glucose, Bld 096 (*) 70 - 99 mg/dL   Calcium, Ion 0.45 (*) 1.13 - 1.30 mmol/L   TCO2 28  0 - 100 mmol/L   Hemoglobin 13.3  12.0 - 15.0 g/dL   HCT 40.9  81.1 - 91.4 %  URINALYSIS, ROUTINE W REFLEX MICROSCOPIC     Status: Abnormal   Collection Time    12/07/12  9:50 PM      Result Value Range   Color, Urine YELLOW  YELLOW   APPearance CLEAR  CLEAR   Specific Gravity, Urine 1.028  1.005 - 1.030   pH 6.5  5.0 - 8.0   Glucose, UA 250 (*) NEGATIVE mg/dL   Hgb urine dipstick NEGATIVE  NEGATIVE   Bilirubin Urine NEGATIVE  NEGATIVE    Ketones, ur NEGATIVE  NEGATIVE mg/dL   Protein, ur NEGATIVE  NEGATIVE mg/dL   Urobilinogen, UA 1.0  0.0 - 1.0 mg/dL   Nitrite NEGATIVE  NEGATIVE   Leukocytes, UA MODERATE (*) NEGATIVE  URINE MICROSCOPIC-ADD ON     Status: None   Collection Time    12/07/12  9:50 PM      Result Value Range   Squamous Epithelial / LPF RARE  RARE   WBC, UA 7-10  <3 WBC/hpf   Bacteria, UA RARE  RARE   Dg Abd Acute W/chest  12/07/2012   *RADIOLOGY REPORT*  Clinical Data: Hematemesis.  Blood in stool.  Diarrhea.  ACUTE ABDOMEN SERIES (ABDOMEN 2 VIEW & CHEST 1 VIEW)  Comparison: Chest, 07/22/2009  Findings: Normal heart size and pulmonary vascularity. Hyperinflation of the lungs consistent with emphysematous changes. Scattered fibrosis in the lungs.  Chronic bronchitic changes.  No blunting of costophrenic angles.  No pneumothorax.  Mediastinal contours appear intact.  Surgical clips in the left upper quadrant. Stable appearance since previous chest.  Gas and stool in the colon with prominent gas filled rectosigmoid colon.  No small or large bowel distension.  No free intra- abdominal air.  No abnormal air fluid levels.  The calcified and tortuous aorta.  Surgical clips in the left upper quadrant and pelvis.  Degenerative changes in the spine and hips.  IMPRESSION: Prominent stool filled rectosigmoid colon with scattered stool throughout the rest of the colon.  No evidence of bowel obstruction or free air.  Chronic emphysematous and bronchitic changes in the lungs.  No active disease.   Original Report Authenticated By: Burman Nieves, M.D.    ROS:as per hpi  Blood pressure 117/77, pulse 101, temperature 97.7 F (36.5 C), resp. rate 20, weight 40.37 kg (89 lb), SpO2 96.00%.  Age appropriate, poor hearing.  Mildly demented. No jvd, no icterus, no pallor. CTA bilat. No w/r/r. Heart irreg, irreg no sig m/r/g. abd flat, soft, nt, nd. ,no mass or hsm 2+ bilat. periph pulses.   No edema. Moe times 4, grossly  neurologically intact.  Assessment/Plan 79 yo female with acute upper gi bleed associated with xarelto use.   Admit to stepdown, iv NS, serial h/h.  Type and cross.   Iv protonix bid.  GI consult.   Continue a few of her oral meds for heart rate, and try to not stop the lyrica cold [place] if we can help it.  She is unsure about code status and she would defer this question to her daughter. For  now she will be a  Full code status.  Ezequiel Kayser, MD 12/07/2012, 10:49 PM

## 2012-12-07 NOTE — ED Provider Notes (Signed)
CSN: [id number]     Arrival date & time 12/07/12  1923 History     First MD Initiated Contact with Patient 12/07/12 2110     Chief Complaint  Patient presents with  . Hematemesis   (Consider location/radiation/quality/duration/timing/severity/associated sxs/prior Treatment) HPI Is obtained from patient and from patient's daughter patient vomited blood, bright red earlier today. Also vomited coffee ground, later on this afternoon. She complained of generalized malaise this morning. Presently she feels well. Patient stopped Coumadin 5 days ago. Started on his arrival to for atrial fibrillation. Patient denies chest pain denies abdominal pain denies other complaint. No other associated symptoms. No treatment prior to coming here. She feels improved over her today. Past Medical History  Diagnosis Date  . Cancer   . Lymphoma, high grade 07/23/2011  . Benign essential HTN 07/23/2011  . Hyperlipidemia 07/23/2011  . 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 DJD (degenerative joint disease), lumbar 07/23/2011   atrial fibrillation History reviewed. No pertinent past surgical history. No family history on file. History  Substance Use Topics  . Smoking status: Not on file  . Smokeless tobacco: Not on file  . Alcohol Use: Not on file   OB History   Grav Para Term Preterm Abortions TAB SAB Ect Mult Living                 Review of Systems  Constitutional:       Malaise  HENT: Negative.   Respiratory: Negative.   Cardiovascular: Negative.   Gastrointestinal: Positive for vomiting.  Musculoskeletal: Negative.   Skin: Negative.   Neurological: Negative.   Psychiatric/Behavioral: Negative.   All other systems reviewed and are negative.    Allergies  Penicillins and Shellfish-derived products  Home Medications   Current Outpatient Rx  Name  Route  Sig  Dispense  Refill  . amLODipine (NORVASC) 5 MG tablet   Oral   Take 5 mg by mouth daily.          Marland Kitchen atenolol (TENORMIN) 25 MG  tablet   Oral   Take 25 mg by mouth daily.          . Cholecalciferol (VITAMIN D PO)   Oral   Take 1 tablet by mouth daily.         . digoxin (LANOXIN) 0.125 MG tablet   Oral   Take 125 mcg by mouth daily.          Marland Kitchen donepezil (ARICEPT) 5 MG tablet   Oral   Take 5 mg by mouth.         . fenofibrate 160 MG tablet   Oral   Take 160 mg by mouth.          . fish oil-omega-3 fatty acids 1000 MG capsule   Oral   Take 1 g by mouth daily.         Marland Kitchen LYRICA 100 MG capsule   Oral   Take 100 mg by mouth at bedtime.          Marland Kitchen oxyCODONE-acetaminophen (PERCOCET/ROXICET) 5-325 MG per tablet   Oral   Take 1 tablet by mouth every 6 (six) hours as needed for pain.         . pregabalin (LYRICA) 75 MG capsule   Oral   Take 75 mg by mouth every morning.         . Prenatal Vit-Fe Fumarate-FA (MULTIVITAMIN-PRENATAL) 27-0.8 MG TABS   Oral   Take 1 tablet by mouth daily.         Marland Kitchen  Rivaroxaban (XARELTO) 20 MG TABS   Oral   Take 20 mg by mouth daily.          BP 117/77  Pulse 101  Temp(Src) 97.7 F (36.5 C)  Resp 20  Wt 89 lb (40.37 kg)  BMI 16.27 kg/m2  SpO2 96% Physical Exam  Nursing note and vitals reviewed. Constitutional: She appears well-developed and well-nourished.  HENT:  Head: Normocephalic and atraumatic.  Eyes: Conjunctivae are normal. Pupils are equal, round, and reactive to light.  Neck: Neck supple. No tracheal deviation present. No thyromegaly present.  Cardiovascular: Normal rate and regular rhythm.   No murmur heard. Pulmonary/Chest: Effort normal and breath sounds normal.  Abdominal: Soft. Bowel sounds are normal. She exhibits no distension. There is no tenderness.  Genitourinary: Guaiac positive stool.  No tone dark brown stool Hemoccult positive  Musculoskeletal: Normal range of motion. She exhibits no edema and no tenderness.  Neurological: She is alert. Coordination normal.  Skin: Skin is warm and dry. No rash noted.  Psychiatric:  She has a normal mood and affect.    ED Course   Procedures (including critical care time)  Labs Reviewed  CBC WITH DIFFERENTIAL - Abnormal; Notable for the following:    Hemoglobin 11.9 (*)    HCT 35.2 (*)    Neutrophils Relative % 83 (*)    All other components within normal limits  PROTIME-INR - Abnormal; Notable for the following:    Prothrombin Time 16.3 (*)    All other components within normal limits  COMPREHENSIVE METABOLIC PANEL  URINALYSIS, ROUTINE W REFLEX MICROSCOPIC   No results found. No diagnosis found. xrays viewed by me   Date: 12/07/2012  Rate: 95  Rhythm: normal sinus rhythm  QRS Axis: normal  Intervals: normal  ST/T Wave abnormalities: ST elevations laterally  Conduction Disutrbances:none  Narrative Interpretation:   Old EKG Reviewed: Possible lateral ischemia new over tracing from 07/21/2009 interpreted by me  MDM  Spoke with Dr.Perini who will arrange for admission. Dx upper gi bleed   Doug Sou, MD 12/07/12 2251

## 2012-12-07 NOTE — ED Notes (Signed)
Assisted MD with rectal exam

## 2012-12-07 NOTE — ED Notes (Signed)
Per family pt c/o feeling sick all day; vomited bright red blood in toilet; coffee ground now per daughter; pt states she got sick yesterday and felt better until lunch today; pt was on coumadin and changed to [place] a wk ago today; INR last Thurs 2.9 per family;  Denies abd pain; denies tarry black stools

## 2012-12-07 NOTE — ED Notes (Signed)
POCT occult blood stool neg.

## 2012-12-08 ENCOUNTER — Encounter (HOSPITAL_COMMUNITY): Payer: Self-pay | Admitting: Endocrinology

## 2012-12-08 ENCOUNTER — Encounter (HOSPITAL_COMMUNITY)
Admission: EM | Disposition: A | Discharge status: Skilled Nursing Facility | Payer: Self-pay | Source: Home / Self Care | Attending: Endocrinology

## 2012-12-08 DIAGNOSIS — Z789 Other specified health status: Secondary | ICD-10-CM

## 2012-12-08 HISTORY — PX: ESOPHAGOGASTRODUODENOSCOPY: SHX5428

## 2012-12-08 HISTORY — DX: Other specified health status: Z78.9

## 2012-12-08 LAB — COMPREHENSIVE METABOLIC PANEL
ALT: 16 U/L (ref 0–35)
BUN: 61 mg/dL — ABNORMAL HIGH (ref 6–23)
CO2: 29 mEq/L (ref 19–32)
Calcium: 9.8 mg/dL (ref 8.4–10.5)
Creatinine, Ser: 0.68 mg/dL (ref 0.50–1.10)
GFR calc Af Amer: 88 mL/min — ABNORMAL LOW (ref >90)
GFR calc non Af Amer: 76 mL/min — ABNORMAL LOW (ref >90)
Glucose, Bld: 121 mg/dL — ABNORMAL HIGH (ref 70–99)
Sodium: 138 mEq/L (ref 135–145)
Total Protein: 6.7 g/dL (ref 6.0–8.3)

## 2012-12-08 LAB — CBC WITH DIFFERENTIAL/PLATELET
Basophils Relative: 0 % (ref 0–1)
HCT: 31.4 % — ABNORMAL LOW (ref 36.0–46.0)
Hemoglobin: 10.8 g/dL — ABNORMAL LOW (ref 12.0–15.0)
Lymphocytes Relative: 24 % (ref 12–46)
Lymphs Abs: 1.8 10*3/uL (ref 0.7–4.0)
Monocytes Absolute: 0.9 10*3/uL (ref 0.1–1.0)
Monocytes Relative: 12 % (ref 3–12)
Neutro Abs: 5 10*3/uL (ref 1.7–7.7)
Neutrophils Relative %: 65 % (ref 43–77)
RBC: 3.63 MIL/uL — ABNORMAL LOW (ref 3.87–5.11)
WBC: 7.8 10*3/uL (ref 4.0–10.5)

## 2012-12-08 LAB — CBC
HCT: 29 % — ABNORMAL LOW (ref 36.0–46.0)
MCV: 86.3 fL (ref 78.0–100.0)
RBC: 3.36 MIL/uL — ABNORMAL LOW (ref 3.87–5.11)
RDW: 15.1 % (ref 11.5–15.5)
WBC: 7.1 10*3/uL (ref 4.0–10.5)

## 2012-12-08 LAB — TYPE AND SCREEN: Antibody Screen: NEGATIVE

## 2012-12-08 LAB — MRSA PCR SCREENING: MRSA by PCR: NEGATIVE

## 2012-12-08 SURGERY — EGD (ESOPHAGOGASTRODUODENOSCOPY)
Anesthesia: Moderate Sedation

## 2012-12-08 MED ORDER — FENTANYL CITRATE 0.05 MG/ML IJ SOLN
INTRAMUSCULAR | Status: AC
  Filled 2012-12-08: qty 2

## 2012-12-08 MED ORDER — SODIUM CHLORIDE 0.9 % IV SOLN: INTRAVENOUS | Status: DC

## 2012-12-08 MED ORDER — ACETAMINOPHEN 650 MG RE SUPP: 650.0000 mg | Freq: Four times a day (QID) | RECTAL | Status: DC | PRN

## 2012-12-08 MED ORDER — ONDANSETRON HCL 4 MG/2ML IJ SOLN
4.0000 mg | Freq: Four times a day (QID) | INTRAMUSCULAR | Status: DC | PRN
  Administered 2012-12-08: 4 mg via INTRAVENOUS
  Filled 2012-12-08: qty 2

## 2012-12-08 MED ORDER — ADULT MULTIVITAMIN W/MINERALS CH
1.0000 | ORAL_TABLET | Freq: Every day | ORAL | Status: DC
  Administered 2012-12-08 – 2012-12-11 (×4): 1 via ORAL
  Filled 2012-12-08 (×4): qty 1

## 2012-12-08 MED ORDER — BUTAMBEN-TETRACAINE-BENZOCAINE 2-2-14 % EX AERO
INHALATION_SPRAY | CUTANEOUS | Status: DC | PRN
  Administered 2012-12-08: 1 via TOPICAL

## 2012-12-08 MED ORDER — PREGABALIN 75 MG PO CAPS
75.0000 mg | ORAL_CAPSULE | Freq: Every day | ORAL | Status: DC
  Administered 2012-12-08 – 2012-12-11 (×4): 75 mg via ORAL
  Filled 2012-12-08 (×4): qty 1

## 2012-12-08 MED ORDER — MIDAZOLAM HCL 10 MG/2ML IJ SOLN
INTRAMUSCULAR | Status: AC
  Filled 2012-12-08: qty 2

## 2012-12-08 MED ORDER — ATENOLOL 25 MG PO TABS
25.0000 mg | ORAL_TABLET | Freq: Every day | ORAL | Status: DC
  Administered 2012-12-08 – 2012-12-11 (×4): 25 mg via ORAL
  Filled 2012-12-08 (×4): qty 1

## 2012-12-08 MED ORDER — ONDANSETRON HCL 4 MG PO TABS: 4.0000 mg | ORAL_TABLET | Freq: Four times a day (QID) | ORAL | Status: DC | PRN

## 2012-12-08 MED ORDER — DIPHENHYDRAMINE HCL 50 MG/ML IJ SOLN
INTRAMUSCULAR | Status: AC
  Filled 2012-12-08: qty 1

## 2012-12-08 MED ORDER — ACETAMINOPHEN 325 MG PO TABS: 650.0000 mg | ORAL_TABLET | Freq: Four times a day (QID) | ORAL | Status: DC | PRN

## 2012-12-08 MED ORDER — DIGOXIN 125 MCG PO TABS
125.0000 ug | ORAL_TABLET | Freq: Every day | ORAL | Status: DC
  Administered 2012-12-08 – 2012-12-11 (×4): 125 ug via ORAL
  Filled 2012-12-08 (×4): qty 1

## 2012-12-08 MED ORDER — FENTANYL CITRATE 0.05 MG/ML IJ SOLN
INTRAMUSCULAR | Status: DC | PRN
  Administered 2012-12-08: 25 ug via INTRAVENOUS

## 2012-12-08 MED ORDER — PANTOPRAZOLE SODIUM 40 MG IV SOLR
40.0000 mg | Freq: Two times a day (BID) | INTRAVENOUS | Status: DC
  Administered 2012-12-08 – 2012-12-09 (×4): 40 mg via INTRAVENOUS
  Filled 2012-12-08 (×7): qty 40

## 2012-12-08 MED ORDER — MIDAZOLAM HCL 10 MG/2ML IJ SOLN
INTRAMUSCULAR | Status: DC | PRN
  Administered 2012-12-08: 1 mg via INTRAVENOUS

## 2012-12-08 MED ORDER — SODIUM CHLORIDE 0.9 % IV SOLN
INTRAVENOUS | Status: DC
  Administered 2012-12-08: 50 mL/h via INTRAVENOUS
  Administered 2012-12-09: 15:00:00 via INTRAVENOUS

## 2012-12-08 NOTE — Progress Notes (Signed)
CARE MANAGEMENT NOTE 12/08/2012  Patient:  DOMINGUE, COLTRAIN   Account Number:  [id number]  Date Initiated:  12/08/2012  Documentation initiated by:  Rakisha Pincock  Subjective/Objective Assessment:   pt with active lower gi bleed requiring prcb x2.  History of recent medicine changes for a.fib to xarelto.     Action/Plan:   home with family support reports that she does live alone.   Anticipated DC Date:  12/10/2012   Anticipated DC Plan:  HOME/SELF CARE  In-house referral  NA      DC Planning Services  NA      [facility] Choice  NA   Choice offered to / List presented to:  NA   DME arranged  NA      DME agency  NA     HH arranged  NA      HH agency  NA   Status of service:  In process, will continue to follow Medicare Important Message given?  NA - LOS <3 / Initial given by admissions (If response is "NO", the following Medicare IM given date fields will be blank) Date Medicare IM given:   Date Additional Medicare IM given:    Discharge Disposition:    Per UR Regulation:  Reviewed for med. necessity/level of care/duration of stay  If discussed at Long Length of Stay Meetings, dates discussed:    Comments:  [id number]/Rachelanne Whidby Earlene Plater RN, BSN, CCN: [id number] Case management. Chart reviewed for discharge planning and present needs. Discharge needs: none present at time of review. Next chart review due:  [id number]

## 2012-12-08 NOTE — Progress Notes (Signed)
INITIAL NUTRITION ASSESSMENT  DOCUMENTATION CODES Per approved criteria  -Underweight   INTERVENTION: Diet advancement per MD discretion Provide Resource Breeze BID or Ensure Complete BID when diet advanced Provide Multivitamin with minerals daily  NUTRITION DIAGNOSIS: Increased nutrient needs related to underweight as evidenced by BMI of 14.24.   Goal: Pt to meet >/= 90% of their estimated nutrition needs   Monitor:  Diet advancement PO intake Weight Labs  Reason for Assessment: Malnutrition Screening Tool, score of 2  79 y.o. female  Admitting Dx: GI Bleed  ASSESSMENT: 79 yo female with dementia (started on aricept last week) and CVA, Lymphoma in remission, and atrial fibrillation on chronic anticoagulation. She was switched from coumadin to xarelto 20 mg daily one week ago. She was feeling fine until yesterday. She started bleeding. She threw up blood . She has a poor memory. Her daughter says at 6:30 pm she told her she was sick. She went over and saw bright red blood in toilet. She may have had some stool with blood too. She had coffee ground emesis upon coming into the ER.  Pt is currently NPO. Pt reports that she was eating normally PTA and usually eats 3 meals daily. Pt states she has been weighing around 90 lbs. Daughter in the room states that she thinks pt forgets to eat sometimes and she will drink Ensure supplements occasionally. Pt states she eats yogurt daily.   Height: Ht Readings from Last 1 Encounters:  12/08/12 5\' 6"  (1.676 m)    Weight: Wt Readings from Last 1 Encounters:  12/08/12 88 lb 2.9 oz (40 kg)    Ideal Body Weight: 130 lbs  % Ideal Body Weight: 68%  Wt Readings from Last 10 Encounters:  12/08/12 88 lb 2.9 oz (40 kg)  12/08/12 88 lb 2.9 oz (40 kg)  10/04/12 92 lb 4.8 oz (41.867 kg)  01/24/12 94 lb 4.8 oz (42.774 kg)    Usual Body Weight: 90 lbs  % Usual Body Weight: 98%  BMI:  Body mass index is 14.24 kg/(m^2).  Estimated  Nutritional Needs: Kcal: 1300-1500 Protein: 48-56 grams Fluid: > 1.2 L  Skin: WDL  Diet Order:    EDUCATION NEEDS: -No education needs identified at this time   Intake/Output Summary (Last 24 hours) at 12/08/12 1433 Last data filed at 12/08/12 1300  Gross per 24 hour  Intake    660 ml  Output    500 ml  Net    160 ml    Last BM: 7/25  Labs:   Recent Labs Lab 12/07/12 2038 12/07/12 2147 12/08/12 0339  NA 134* 138 138  K 4.3 4.5 4.1  CL 98 100 102  CO2 30  --  29  BUN 49* 54* 61*  CREATININE 0.73 0.90 0.68  CALCIUM 10.2  --  9.8  GLUCOSE 148* 138* 121*    CBG (last 3)  No results found for this basename: GLUCAP,  in the last 72 hours  Scheduled Meds: . atenolol  25 mg Oral Daily  . digoxin  125 mcg Oral Daily  . pantoprazole (PROTONIX) IV  40 mg Intravenous Q12H  . pregabalin  75 mg Oral Daily    Continuous Infusions: . sodium chloride 50 mL/hr (12/08/12 0043)  . sodium chloride      Past Medical History  Diagnosis Date  . Cancer   . Lymphoma, high grade 07/23/2011  . Benign essential HTN 07/23/2011  . Hyperlipidemia 07/23/2011  . 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 DJD (degenerative  joint disease), lumbar 07/23/2011  . Current non-smoker but past smoking history unknown 12/08/12    Past Surgical History  Procedure Laterality Date  . Colon surgery    . Appendectomy    . Abdominal hysterectomy    . Hernia repair    . Tonsillectomy    . Splenectomy    . Shoulder surgery    . Hemorroidectomy      Ian Malkin RD, LDN Inpatient Clinical Dietitian Pager: [phone] After Hours Pager: [phone]

## 2012-12-08 NOTE — Op Note (Signed)
[facility] [address] [place], [address]   ENDOSCOPY PROCEDURE REPORT  PATIENT: [name], [name]  MR#: [id number] BIRTHDATE: 07/30/1922 , 89  yrs. old GENDER: Female ENDOSCOPIST: Wandalee Ferdinand, MD REFERRED BY: PROCEDURE DATE:  12/08/2012 PROCEDURE:   EGD ASA CLASS: 3 INDICATIONS: upper GI bleed MEDICATIONS: fentanyl 25 mcg IV, Versed 1 mg IV TOPICAL ANESTHETIC:Cetacaine spray  DESCRIPTION OF PROCEDURE:   After the risks benefits and alternatives of the procedure were thoroughly explained, informed consent was obtained.  The Pentax Gastroscope [id number]  endoscope was introduced through the mouth and advanced to the second portion of the duodenum      , limited by Without limitations.   The instrument was slowly withdrawn as the mucosa was fully examined.      FINDINGS:  Esophagus: Normal  Stomach: Diffuse erythematous mucosa consistent with gastritis with some scattered erosions. No significant specific site of bleeding but the mucosa is friable.  Duodenum: Normal  COMPLICATIONS:none  ENDOSCOPIC IMPRESSION:see above   RECOMMENDATIONS: I would recommend continuing PPI therapy. Follow H&H.      _______________________________ Rosalie DoctorWandalee Ferdinand, MD 12/08/2012 12:22 PM

## 2012-12-08 NOTE — Consult Note (Signed)
Subjective:   HPI  The patient is an 79 year old female who we are asked to see in consultation in regards to gastrointestinal bleeding. She vomited coffee-ground emesis yesterday and also passed some melena. The patient does have a history of atrial fibrillation and had been on Coumadin but recently was switched to [place]. The patient does not remember vomiting. She has a history of dementia.  Review of Systems Denies chest pain or shortness of breath  Past Medical History  Diagnosis Date  . Cancer   . Lymphoma, high grade 07/23/2011  . Benign essential HTN 07/23/2011  . Hyperlipidemia 07/23/2011  . 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 DJD (degenerative joint disease), lumbar 07/23/2011  . Current non-smoker but past smoking history unknown 12/08/12   History reviewed. No pertinent past surgical history. History   Social History  . Marital Status: Married    Spouse Name: N/A    Number of Children: N/A  . Years of Education: N/A   Occupational History  . Not on file.   Social History Main Topics  . Smoking status: Not on file  . Smokeless tobacco: Not on file  . Alcohol Use: Not on file  . Drug Use: Not on file  . Sexually Active: Not on file   Other Topics Concern  . Not on file   Social History Narrative  . No narrative on file   family history is not on file. Current facility-administered medications:0.9 %  sodium chloride infusion, , Intravenous, Continuous, Ezequiel Kayser, MD, Last Rate: 50 mL/hr at 12/08/12 0043, 50 mL/hr at 12/08/12 0043;  acetaminophen (TYLENOL) suppository 650 mg, 650 mg, Rectal, Q6H PRN, Ezequiel Kayser, MD;  acetaminophen (TYLENOL) tablet 650 mg, 650 mg, Oral, Q6H PRN, Ezequiel Kayser, MD;  atenolol (TENORMIN) tablet 25 mg, 25 mg, Oral, Daily, Ezequiel Kayser, MD digoxin (LANOXIN) tablet 125 mcg, 125 mcg, Oral, Daily, Ezequiel Kayser, MD;  ondansetron (ZOFRAN) injection 4 mg, 4 mg, Intravenous, Q6H PRN, Ezequiel Kayser, MD, 4 mg at 12/08/12 0707;   ondansetron (ZOFRAN) tablet 4 mg, 4 mg, Oral, Q6H PRN, Ezequiel Kayser, MD;  pantoprazole (PROTONIX) injection 40 mg, 40 mg, Intravenous, Q12H, Ezequiel Kayser, MD, 40 mg at 12/08/12 0043;  pregabalin (LYRICA) capsule 75 mg, 75 mg, Oral, Daily, Ezequiel Kayser, MD Allergies  Allergen Reactions  . Penicillins   . Shellfish-Derived Products      Objective:     BP 136/66  Pulse 110  Temp(Src) 98.1 F (36.7 C) (Oral)  Resp 19  Ht 5\' 6"  (1.676 m)  Wt 40 kg (88 lb 2.9 oz)  BMI 14.24 kg/m2  SpO2 93%  She is in no distress  Heart irregular rhythm  Lungs clear  Abdomen soft, normal bowel sounds, nontender  Laboratory No components found with this basename: d1      Assessment:     GI bleed      Plan:     PPI therapy. Proceed with EGD today to evaluate upper GI tract. I discussed EGD with her daughter [name] who is a Engineer, civil (consulting) at [employer]. She gave consent over the telephone. Lab Results  Component Value Date   HGB 10.8* 12/08/2012   HGB 13.3 12/07/2012   HGB 11.9* 12/07/2012   HGB 12.4 10/04/2012   HGB 13.6 01/24/2012   HGB 13.4 07/16/2011   HCT 31.4* 12/08/2012   HCT 39.0 12/07/2012   HCT 35.2* 12/07/2012   HCT 36.9 10/04/2012   HCT 39.4 01/24/2012  HCT 40.8 07/16/2011   ALKPHOS 40 12/08/2012   ALKPHOS 46 12/07/2012   ALKPHOS 63 10/04/2012   ALKPHOS 69 01/24/2012   ALKPHOS 63 07/16/2011   AST 35 12/08/2012   AST 40* 12/07/2012   AST 33 10/04/2012   AST 37* 01/24/2012   AST 37 07/16/2011   ALT 16 12/08/2012   ALT 19 12/07/2012   ALT 17 10/04/2012   ALT 19 01/24/2012   ALT 17 07/16/2011

## 2012-12-08 NOTE — Progress Notes (Signed)
Subjective: Admitted after vomiting blood.  She is not too clear on the events due to her dementia, knows she is at the hospital, but not why or what day it is.  No family present.  Objective: Vital signs in last 24 hours: Temp:  [97.7 F (36.5 C)-98.4 F (36.9 C)] 98.1 F (36.7 C) (07/25 0739) Pulse Rate:  [101-110] 110 (07/25 0739) Resp:  [19-26] 19 (07/25 0739) BP: (117-151)/(66-79) 136/66 mmHg (07/25 0400) SpO2:  [93 %-96 %] 93 % (07/25 0739) Weight:  [40 kg (88 lb 2.9 oz)-40.37 kg (89 lb)] 40 kg (88 lb 2.9 oz) (07/25 0030) Weight change:  Last BM Date: 12/08/12  Intake/Output from previous day: 07/24 0701 - 07/25 0700 In: 400 [I.V.:400] Out: 350 [Urine:350] Intake/Output this shift:    General appearance: alert, cooperative and appears stated age Neck: no adenopathy, no carotid bruit, no JVD, supple, symmetrical, trachea midline and thyroid not enlarged, symmetric, no tenderness/mass/nodules Resp: clear to auscultation bilaterally Cardio: irregularly irregular rhythm GI: soft, non-tender; bowel sounds normal; no masses,  no organomegaly Extremities: extremities normal, atraumatic, no cyanosis or edema Neurologic: Grossly normal except for dementia deficits at baseline.  Lab Results:  Recent Labs  12/07/12 2038 12/07/12 2147 12/08/12 0339  WBC 8.9  --  7.8  HGB 11.9* 13.3 10.8*  HCT 35.2* 39.0 31.4*  PLT 291  --  287   BMET  Recent Labs  12/07/12 2038 12/07/12 2147 12/08/12 0339  NA 134* 138 138  K 4.3 4.5 4.1  CL 98 100 102  CO2 30  --  29  GLUCOSE 148* 138* 121*  BUN 49* 54* 61*  CREATININE 0.73 0.90 0.68  CALCIUM 10.2  --  9.8    Studies/Results: Dg Abd Acute W/chest  12/07/2012   *RADIOLOGY REPORT*  Clinical Data: Hematemesis.  Blood in stool.  Diarrhea.  ACUTE ABDOMEN SERIES (ABDOMEN 2 VIEW & CHEST 1 VIEW)  Comparison: Chest, 07/22/2009  Findings: Normal heart size and pulmonary vascularity. Hyperinflation of the lungs consistent with  emphysematous changes. Scattered fibrosis in the lungs.  Chronic bronchitic changes.  No blunting of costophrenic angles.  No pneumothorax.  Mediastinal contours appear intact.  Surgical clips in the left upper quadrant. Stable appearance since previous chest.  Gas and stool in the colon with prominent gas filled rectosigmoid colon.  No small or large bowel distension.  No free intra- abdominal air.  No abnormal air fluid levels.  The calcified and tortuous aorta.  Surgical clips in the left upper quadrant and pelvis.  Degenerative changes in the spine and hips.  IMPRESSION: Prominent stool filled rectosigmoid colon with scattered stool throughout the rest of the colon.  No evidence of bowel obstruction or free air.  Chronic emphysematous and bronchitic changes in the lungs.  No active disease.   Original Report Authenticated By: Burman Nieves, M.D.    Medications:  I have reviewed the patient's current medications. Scheduled: . atenolol  25 mg Oral Daily  . digoxin  125 mcg Oral Daily  . pantoprazole (PROTONIX) IV  40 mg Intravenous Q12H  . pregabalin  75 mg Oral Daily   Continuous: . sodium chloride 50 mL/hr (12/08/12 0043)   [id number], acetaminophen, ondansetron (ZOFRAN) IV, ondansetron  Assessment/Plan: GI Bleed-  Has not required anticoagulation.  PPI, Xarelto held, consult was placed to [facility] per admitting physician.  NPO as may undergo EGD per their discretion.  Will check Hgb again at 3pm. Afib- Rate slightly high today, will change IV meds if  needed Dementia- Baseline per chart.  Family will likely need to be providing procedure consent.     LOS: 1 day   Omarie Parcell W 12/08/2012, 7:43 AM

## 2012-12-09 ENCOUNTER — Encounter (HOSPITAL_COMMUNITY): Payer: Self-pay | Admitting: *Deleted

## 2012-12-09 LAB — CBC WITH DIFFERENTIAL/PLATELET
Basophils Absolute: 0.1 10*3/uL (ref 0.0–0.1)
Basophils Relative: 1 % (ref 0–1)
Eosinophils Absolute: 0.3 10*3/uL (ref 0.0–0.7)
Eosinophils Relative: 4 % (ref 0–5)
HCT: 28.5 % — ABNORMAL LOW (ref 36.0–46.0)
Lymphocytes Relative: 40 % (ref 12–46)
MCH: 29.8 pg (ref 26.0–34.0)
MCHC: 34 g/dL (ref 30.0–36.0)
MCV: 87.4 fL (ref 78.0–100.0)
Monocytes Absolute: 0.9 10*3/uL (ref 0.1–1.0)
RDW: 15.2 % (ref 11.5–15.5)

## 2012-12-09 LAB — COMPREHENSIVE METABOLIC PANEL
ALT: 13 U/L (ref 0–35)
AST: 30 U/L (ref 0–37)
Albumin: 2.5 g/dL — ABNORMAL LOW (ref 3.5–5.2)
Alkaline Phosphatase: 35 U/L — ABNORMAL LOW (ref 39–117)
Chloride: 105 mEq/L (ref 96–112)
Potassium: 3.6 mEq/L (ref 3.5–5.1)
Sodium: 136 mEq/L (ref 135–145)
Total Bilirubin: 0.5 mg/dL (ref 0.3–1.2)
Total Protein: 5.8 g/dL — ABNORMAL LOW (ref 6.0–8.3)

## 2012-12-09 MED ORDER — BOOST / RESOURCE BREEZE PO LIQD
1.0000 | Freq: Two times a day (BID) | ORAL | Status: DC
  Administered 2012-12-09 – 2012-12-11 (×6): 1 via ORAL

## 2012-12-09 MED ORDER — PANTOPRAZOLE SODIUM 40 MG PO TBEC
40.0000 mg | DELAYED_RELEASE_TABLET | Freq: Every day | ORAL | Status: DC
  Administered 2012-12-10 – 2012-12-11 (×2): 40 mg via ORAL
  Filled 2012-12-09 (×2): qty 1

## 2012-12-09 MED ORDER — PANTOPRAZOLE SODIUM 40 MG PO TBEC: 40.0000 mg | DELAYED_RELEASE_TABLET | Freq: Two times a day (BID) | ORAL | Status: DC

## 2012-12-09 NOTE — Discharge Summary (Addendum)
Entered in error

## 2012-12-09 NOTE — Progress Notes (Addendum)
Subjective: Admitted after vomiting blood.   No events overnight.   Objective: Vital signs in last 24 hours: Temp:  [97.4 F (36.3 C)-98.6 F (37 C)] 97.9 F (36.6 C) (07/26 0800) Pulse Rate:  [56-79] 68 (07/26 0913) Resp:  [14-26] 22 (07/26 0910) BP: (115-164)/(53-80) 133/53 mmHg (07/26 0912) SpO2:  [89 %-98 %] 96 % (07/26 0910) Weight:  [90 lb 6.2 oz (41 kg)] 90 lb 6.2 oz (41 kg) (07/26 0500) Weight change: 1 lb 6.2 oz (0.63 kg) Last BM Date: 12/08/12  Intake/Output from previous day: 07/25 0701 - 07/26 0700 In: 1640 [P.O.:540; I.V.:1100] Out: 1200 [Urine:1200] Intake/Output this shift: Total I/O In: 390 [P.O.:240; I.V.:150] Out: 300 [Urine:300]  General appearance: alert, cooperative and appears stated age Neck: no adenopathy, no carotid bruit, no JVD, supple, symmetrical, trachea midline and thyroid not enlarged, symmetric, no tenderness/mass/nodules Resp: clear to auscultation bilaterally Cardio: irregularly irregular rhythm GI: soft, non-tender; bowel sounds normal; no masses,  no organomegaly Extremities: extremities normal, atraumatic, no cyanosis or edema Neurologic: Grossly normal except for dementia deficits at baseline.  Lab Results:  Recent Labs  12/08/12 1528 12/09/12 0345  WBC 7.1 6.1  HGB 10.1* 9.7*  HCT 29.0* 28.5*  PLT 279 255   BMET  Recent Labs  12/08/12 0339 12/09/12 0345  NA 138 136  K 4.1 3.6  CL 102 105  CO2 29 28  GLUCOSE 121* 74  BUN 61* 31*  CREATININE 0.68 0.71  CALCIUM 9.8 9.3    Studies/Results: Dg Abd Acute W/chest  12/07/2012   *RADIOLOGY REPORT*  Clinical Data: Hematemesis.  Blood in stool.  Diarrhea.  ACUTE ABDOMEN SERIES (ABDOMEN 2 VIEW & CHEST 1 VIEW)  Comparison: Chest, 07/22/2009  Findings: Normal heart size and pulmonary vascularity. Hyperinflation of the lungs consistent with emphysematous changes. Scattered fibrosis in the lungs.  Chronic bronchitic changes.  No blunting of costophrenic angles.  No pneumothorax.   Mediastinal contours appear intact.  Surgical clips in the left upper quadrant. Stable appearance since previous chest.  Gas and stool in the colon with prominent gas filled rectosigmoid colon.  No small or large bowel distension.  No free intra- abdominal air.  No abnormal air fluid levels.  The calcified and tortuous aorta.  Surgical clips in the left upper quadrant and pelvis.  Degenerative changes in the spine and hips.  IMPRESSION: Prominent stool filled rectosigmoid colon with scattered stool throughout the rest of the colon.  No evidence of bowel obstruction or free air.  Chronic emphysematous and bronchitic changes in the lungs.  No active disease.   Original Report Authenticated By: Burman Nieves, M.D.    Medications:  I have reviewed the patient's current medications. Scheduled: . atenolol  25 mg Oral Daily  . digoxin  125 mcg Oral Daily  . feeding supplement  1 Container Oral BID BM  . multivitamin with minerals  1 tablet Oral Daily  . pantoprazole  40 mg Oral Daily  . pregabalin  75 mg Oral Daily   Continuous: . sodium chloride 50 mL/hr at 12/09/12 0700  . sodium chloride     OZH:[id number], acetaminophen, ondansetron (ZOFRAN) IV, ondansetron  Assessment/Plan: GI Bleed-  Holding xarelto x 3-5 days to allow maturation of any clot that may have been missed on EGD that could have caused bleed. Continue PPI QD PO for at least 1 month. Xarelto held until outpt f/u this week to likely resume at that time if CBC stable, given xarelto for afib only and not DVT/PE treatment  it is ok to wait 3-7 days to ensure no continued bleeding. EGD showed gastritis and friable gastric mucosa. No active source of bleeding. GI feels this was unlikely lower GI given BUN elevation, hematemesis and no maroon stool, so will not pursue colonoscopy. Advancing diet to full today, monitor x 24 hours, discharge in AM if still stable w/ close outpt f/u next week.    Afib- HR 50-60s on home meds. holding xarelto  as above   Dementia- Baseline per chart.  Family will likely need to be providing procedure consent.  Dispo : transferring to tele bed today, d/c in AM if stable Hgb     LOS: 2 days   Milda Lindvall 12/09/2012, 10:49 AM

## 2012-12-09 NOTE — Evaluation (Signed)
Physical Therapy Evaluation Patient Details Name: [name] MRN: [id number] DOB: June 17, 1922 Today's Date: 12/09/2012 Time: 7829-5621 PT Time Calculation (min): 30 min  PT Assessment / Plan / Recommendation History of Present Illness  79 y.o. female admitted to [facility] with dementia (started on aricept last week) and CVA,  Lymphoma in remission, and atrial fibrillation on chronic anticoagulation.  She was switched from coumadin to xarelto 20 mg daily one week ago.  She was feeling fine until yesterday. She started bleeding.  She threw up blood .  She has a poor memory. Her daughter says at 6:30 pm she told her she was sick.  She went over and saw bright red blood in toilet. She may have had some stool with blood too.  She had coffee ground emesis upon coming into the ER.  Clinical Impression  This pleasantly confused female did well with me only needing min hand held assist to walk the entire unit.  Her biggest issue other than some generalized deconditioning and unsteadiness during gait is her cognition.  Per daughter (we spoke over the phone) her cognition has been slowly getting worse and is significantly worse during this hospital admission.  She is not sure yet what the family is planning for her (she would like to speak to a Child psychotherapist) because it depends on her physical and mental progress, but they are looking at either hiring help if she gets back to baseline vs. Pursuing SNF for rehab and then ALF transition.      PT Assessment  Patient needs continued PT services    Follow Up Recommendations  SNF (with transition from SNF for rehab to ALF)    Does the patient have the potential to tolerate intense rehabilitation     NA  Barriers to Discharge Decreased caregiver support Daughter lives behind pt, but pt has had progressively worsening memory issues.  Per daughter depending on how she is recovering she is interested in anything from hired help for her at home to SNF placement  for rehab and then pursuing ALF.  This will all depend on her progress.  Daughter works as Charity fundraiser at [employer] and cannot be home with her 24/7.      Equipment Recommendations  Rolling walker with 5" wheels    Recommendations for Other Services   None  Frequency Min 3X/week    Precautions / Restrictions Precautions Precautions: Fall Precaution Comments: h/o dementia   Pertinent Vitals/Pain See vitals flow sheet.       Mobility  Bed Mobility Bed Mobility: Supine to Sit;Sitting - Scoot to Edge of Bed Supine to Sit: 4: Min assist Sitting - Scoot to [place] of Bed: 4: Min assist Details for Bed Mobility Assistance: min assist to support trunk fro balance while moving to the side of the bed.   Transfers Transfers: Sit to Stand;Stand to Sit Sit to Stand: 4: Min assist Stand to Sit: 4: Min assist Details for Transfer Assistance: min hand held assist to support pt for balance during transitions.  Pt self reported that she feels a little weak and unsteady.   Ambulation/Gait Ambulation/Gait Assistance: 4: Min assist Ambulation Distance (Feet): 400 Feet Assistive device: 1 person hand held assist;Other (Comment) (and hallway railing. ) Ambulation/Gait Assistance Details: min assist to steady pt for balance due to generally weak and staggering/wobbly gait pattern.   Gait Pattern: Step-through pattern;Shuffle;Narrow base of support (staggering)        PT Diagnosis: Difficulty walking;Abnormality of gait;Generalized weakness;Altered mental status  PT  Problem List: Decreased strength;Decreased activity tolerance;Decreased balance;Decreased mobility;Decreased cognition;Decreased knowledge of use of DME;Decreased safety awareness PT Treatment Interventions: DME instruction;Gait training;Stair training;Functional mobility training;Therapeutic activities;Therapeutic exercise;Balance training;Neuromuscular re-education;Cognitive remediation;Patient/family education     PT Goals(Current goals  can be found in the care plan section) Acute Rehab PT Goals Patient Stated Goal: to get stronger PT Goal Formulation: With patient/family Time For Goal Achievement: 12/23/12 Potential to Achieve Goals: Good  Visit Information  Last PT Received On: 12/09/12 Assistance Needed: +1 History of Present Illness: 79 y.o. female admitted to [facility] with dementia (started on aricept last week) and CVA,  Lymphoma in remission, and atrial fibrillation on chronic anticoagulation.  She was switched from coumadin to xarelto 20 mg daily one week ago.  She was feeling fine until yesterday. She started bleeding.  She threw up blood .  She has a poor memory. Her daughter says at 6:30 pm she told her she was sick.  She went over and saw bright red blood in toilet. She may have had some stool with blood too.  She had coffee ground emesis upon coming into the ER.       Prior Functioning  Home Living Family/patient expects to be discharged to:: Private residence Living Arrangements: Alone Available Help at Discharge: Family;Available PRN/intermittently Type of Home: House Home Access: Stairs to enter Home Layout: One level Additional Comments: did not asssess home equipment.   Prior Function Level of Independence: Needs assistance Gait / Transfers Assistance Needed: none ADL's / Homemaking Assistance Needed: assist with heavier cleaning, has cleaning lady who comes every two weeks.   Communication / Swallowing Assistance Needed: none Comments: daughter cooks meals and puts them in the refridgerator.  She reports that pt has been losing weight and she thinks it is because she forgets to eat.  Pt is still driving, but got lost the other day while in the car.   Communication Communication: No difficulties    Cognition  Cognition Arousal/Alertness: Awake/alert Behavior During Therapy: WFL for tasks assessed/performed Overall Cognitive Status: Impaired/Different from baseline Area of Impairment:  Memory;Safety/judgement;Problem solving Memory: Decreased short-term memory Safety/Judgement: Decreased awareness of safety Problem Solving: Slow processing General Comments: per pt's daughter her memory is worse than her baseline and this has been getting worse for a period of time now.  She states that since she has been in the hospital it is significantly worse than baseline.      Extremity/Trunk Assessment Upper Extremity Assessment Upper Extremity Assessment: Generalized weakness Lower Extremity Assessment Lower Extremity Assessment: Generalized weakness Cervical / Trunk Assessment Cervical / Trunk Assessment: Kyphotic   Balance Balance Balance Assessed: Yes Static Sitting Balance Static Sitting - Balance Support: No upper extremity supported;Feet supported Static Sitting - Level of Assistance: 5: Stand by assistance Static Standing Balance Static Standing - Balance Support: Left upper extremity supported Static Standing - Level of Assistance: 4: Min assist Dynamic Standing Balance Dynamic Standing - Balance Support: Bilateral upper extremity supported Dynamic Standing - Level of Assistance: 4: Min assist  End of Session PT - End of Session Activity Tolerance: Patient limited by fatigue Patient left: in chair;with call bell/phone within reach;with chair alarm set Nurse Communication: Mobility status    Lurena Joiner B. Shiya Fogelman, PT, DPT [phone]   12/09/2012, 11:10 AM

## 2012-12-09 NOTE — Progress Notes (Signed)
GASTROENTEROLOGY PROGRESS NOTE  Problem:   Upper GI bleed  Subjective: Feels fine. No gastric complaints such as nausea. Does feel hungry.  Objective: Small black bowel movement at 5 PM yesterday, no bowel movements for the past 30 hours.  Vital signs normal.  Patient awake and alert, pleasant, no distress whatsoever. Vital signs normal. Skin warm and dry, radial pulse full.  Hemoglobin has drifted down from 10.8 to current level of 9.7 over 24 hours, consistent with equilibration. During the same time, her BUN fell from 61 to a current level of 31. This would go against further bleeding.  Endoscopic findings noted.  Assessment: Quiescent upper GI bleed. Based on the endoscopic findings, I suspect this may have been multifocal in origin with diffuse mucosal hemorrhage or oozing while on anticoagulation.  Plan: I have discussed the plan with Dr. Link Snuffer.  I agree with moving the patient out of the ICU and allowing a solid diet.  Based on the endoscopic findings, I would probably favor lifelong PPI therapy in this patient, if she is going to remain on anticoagulation. If she is not going back on anticoagulation, one month of once daily PPI therapy would probably be sufficient.  One other therapeutic option for a patient with diffuse gastritis like this would be cytoprotective therapy with either sucralfate or misoprostol, but I would probably reserve those options for failure of PPI therapy, that is, if she bleeds again despite being on PPI prophylaxis.  I do not feel that followup endoscopy, or GI office followup is required. However, we would be very happy to see the patient again at your request. I will sign off at the present time but please call if questions arise prior to discharge.  Florencia Reasons, M.D. 12/09/2012 11:19 AM

## 2012-12-10 ENCOUNTER — Encounter (HOSPITAL_COMMUNITY): Payer: Self-pay | Admitting: Internal Medicine

## 2012-12-10 LAB — CBC WITH DIFFERENTIAL/PLATELET
Eosinophils Absolute: 0.2 10*3/uL (ref 0.0–0.7)
Hemoglobin: 9.7 g/dL — ABNORMAL LOW (ref 12.0–15.0)
Lymphs Abs: 2 10*3/uL (ref 0.7–4.0)
MCH: 29.8 pg (ref 26.0–34.0)
Monocytes Relative: 16 % — ABNORMAL HIGH (ref 3–12)
Neutro Abs: 3.2 10*3/uL (ref 1.7–7.7)
Neutrophils Relative %: 50 % (ref 43–77)
RBC: 3.26 MIL/uL — ABNORMAL LOW (ref 3.87–5.11)

## 2012-12-10 LAB — COMPREHENSIVE METABOLIC PANEL
AST: 29 U/L (ref 0–37)
CO2: 28 mEq/L (ref 19–32)
Calcium: 9.3 mg/dL (ref 8.4–10.5)
Creatinine, Ser: 0.69 mg/dL (ref 0.50–1.10)
GFR calc non Af Amer: 75 mL/min — ABNORMAL LOW (ref >90)
Total Protein: 5.9 g/dL — ABNORMAL LOW (ref 6.0–8.3)

## 2012-12-10 MED ORDER — PANTOPRAZOLE SODIUM 40 MG PO TBEC: 40.0000 mg | DELAYED_RELEASE_TABLET | Freq: Every day | ORAL | Status: DC

## 2012-12-10 NOTE — Progress Notes (Signed)
Subjective: No events overnight Medically ready for discharge  Objective: Vital signs in last 24 hours: Temp:  [97.8 F (36.6 C)-98.3 F (36.8 C)] 98.3 F (36.8 C) (07/27 0452) Pulse Rate:  [63-68] 65 (07/27 0452) Resp:  [18-23] 18 (07/27 0452) BP: (108-153)/(63-70) 153/63 mmHg (07/27 0452) SpO2:  [93 %-100 %] 93 % (07/27 0452) Weight:  [87 lb 1.3 oz (39.5 kg)-90 lb 4.8 oz (40.96 kg)] 87 lb 1.3 oz (39.5 kg) (07/27 0452) Weight change: -1.4 oz (-0.04 kg) Last BM Date: 12/10/12  Intake/Output from previous day: 07/26 0701 - 07/27 0700 In: 2060 [P.O.:960; I.V.:1100] Out: 2950 [Urine:2950] Intake/Output this shift: Total I/O In: 360 [P.O.:360] Out: -   General appearance: alert, cooperative and appears stated age Neck: no adenopathy, no carotid bruit, no JVD, supple, symmetrical, trachea midline and thyroid not enlarged, symmetric, no tenderness/mass/nodules Resp: clear to auscultation bilaterally Cardio: irregularly irregular rhythm GI: soft, non-tender; bowel sounds normal; no masses,  no organomegaly Extremities: extremities normal, atraumatic, no cyanosis or edema Neurologic: Grossly normal except for dementia deficits at baseline.  Lab Results:  Recent Labs  12/09/12 0345 12/10/12 0435  WBC 6.1 6.5  HGB 9.7* 9.7*  HCT 28.5* 28.3*  PLT 255 269   BMET  Recent Labs  12/09/12 0345 12/10/12 0435  NA 136 131*  K 3.6 3.4*  CL 105 97  CO2 28 28  GLUCOSE 74 83  BUN 31* 18  CREATININE 0.71 0.69  CALCIUM 9.3 9.3    Studies/Results: No results found.  Medications:  I have reviewed the patient's current medications. Scheduled: . atenolol  25 mg Oral Daily  . digoxin  125 mcg Oral Daily  . feeding supplement  1 Container Oral BID BM  . multivitamin with minerals  1 tablet Oral Daily  . pantoprazole  40 mg Oral Daily  . pregabalin  75 mg Oral Daily   Continuous: . sodium chloride 50 mL/hr at 12/09/12 1506   ZOX:[id number], acetaminophen, ondansetron  (ZOFRAN) IV, ondansetron  Assessment/Plan: GI Bleed-  Xarelto held until wed, recheck Hgb and resume if stable. given xarelto for afib only and not DVT/PE treatment it is ok to wait 3-7 days to ensure no continued bleeding. Continue PPI QD PO indefinitely.  EGD showed gastritis and friable gastric mucosa. No active source of bleeding. GI feels this was unlikely lower GI given BUN elevation, hematemesis and no maroon stool, so will not pursue colonoscopy. Tolerating full diet.  Afib- HR 50-60s on home meds. holding xarelto as above   Dementia- Baseline per chart.  Family will likely need to be providing procedure consent.  Dispo :PT has rec'd SNF/rehab and daughter wants to explore this option. Medically ready for discharge once bed available.      LOS: 3 days   Isaac Lacson 12/10/2012, 11:16 AM

## 2012-12-10 NOTE — Progress Notes (Signed)
Clinical Social Work Department BRIEF PSYCHOSOCIAL ASSESSMENT 12/10/2012  Patient:  [name], [name]     Account Number:  [id number]     Admit date:  12/07/2012  Clinical Social Worker:  Doroteo Glassman  Date/Time:  12/10/2012 01:04 PM  Referred by:  Physician  Date Referred:  12/10/2012 Referred for  SNF Placement   Other Referral:   Interview type:  Other - See comment Other interview type:   Pt's daughter via phone    PSYCHOSOCIAL DATA Living Status:  ALONE Admitted from facility:   Level of care:   Primary support name:  Mrs. Elmyra Ricks Primary support relationship to patient:  CHILD, ADULT Degree of support available:   strong    CURRENT CONCERNS Current Concerns  Post-Acute Placement   Other Concerns:    SOCIAL WORK ASSESSMENT / PLAN Spoke with Pt's daughter at length via phone.    Pt's daughter concerned about Pt returning home upon d/c and is wanting to explore SNF, as this was PT's recommendation.  Pt's daughter gave CSW permission to explore [place] SNFs.    CSW thanked Pt's daugher for her time.    CSW later met with Pt's daughter outside of Pt's room.  CSW provided Pt's daughter with [place] SNF list.    CSW thanked Pt's daughter for her time.   Assessment/plan status:  Psychosocial Support/Ongoing Assessment of Needs Other assessment/ plan:   Information/referral to community resources:   [facility] SNF list    PATIENT'S/FAMILY'S RESPONSE TO PLAN OF CARE: Pt's daughter looking forward to Pt going to SNF, as she feels that Pt need some rehab so that she can return to her independent level of functioning.    Pt's daughter thanked CSW for time and assistance.   Providence Crosby, LCSWA Clinical Social Work [phone]

## 2012-12-10 NOTE — Progress Notes (Signed)
Clinicals sent to [facility] for prior approval.  Providence Crosby, [facility] Clinical Social Work [phone]

## 2012-12-10 NOTE — Progress Notes (Signed)
Clinical Social Work Department CLINICAL SOCIAL WORK PLACEMENT NOTE 12/10/2012  Patient:  DONIELLE, KAIGLER  Account Number:  [id number] Admit date:  12/07/2012  Clinical Social Worker:  Doroteo Glassman  Date/time:  12/10/2012 01:08 PM  Clinical Social Work is seeking post-discharge placement for this patient at the following level of care:   SKILLED NURSING   (*CSW will update this form in Epic as items are completed)   12/10/2012  Patient/family provided with [facility] Department of Clinical Social Work's list of facilities offering this level of care within the geographic area requested by the patient (or if unable, by the patient's family).  12/10/2012  Patient/family informed of their freedom to choose among providers that offer the needed level of care, that participate in Medicare, Medicaid or managed care program needed by the patient, have an available bed and are willing to accept the patient.  12/10/2012  Patient/family informed of [facility]' ownership interest in [facility], as well as of the fact that they are under no obligation to receive care at this facility.  PASARR submitted to EDS on 12/10/2012 PASARR number received from EDS on 12/10/2012  FL2 transmitted to all facilities in geographic area requested by pt/family on  12/10/2012 FL2 transmitted to all facilities within larger geographic area on   Patient informed that his/her managed care company has contracts with or will negotiate with  certain facilities, including the following:     Patient/family informed of bed offers received:   Patient chooses bed at  Physician recommends and patient chooses bed at    Patient to be transferred to  on   Patient to be transferred to facility by   The following physician request were entered in Epic:   Additional Comments:  Providence Crosby, Theresia Majors Clinical Social Work [phone]

## 2012-12-11 ENCOUNTER — Encounter (HOSPITAL_COMMUNITY): Payer: Self-pay | Admitting: Gastroenterology

## 2012-12-11 LAB — COMPREHENSIVE METABOLIC PANEL
Alkaline Phosphatase: 55 U/L (ref 39–117)
BUN: 11 mg/dL (ref 6–23)
Chloride: 99 mEq/L (ref 96–112)
GFR calc Af Amer: >90 mL/min (ref >90)
Glucose, Bld: 87 mg/dL (ref 70–99)
Potassium: 3.8 mEq/L (ref 3.5–5.1)
Total Bilirubin: 0.5 mg/dL (ref 0.3–1.2)

## 2012-12-11 LAB — CBC WITH DIFFERENTIAL/PLATELET
Basophils Absolute: 0.1 10*3/uL (ref 0.0–0.1)
Eosinophils Relative: 6 % — ABNORMAL HIGH (ref 0–5)
Lymphocytes Relative: 32 % (ref 12–46)
Neutro Abs: 2.9 10*3/uL (ref 1.7–7.7)
Platelets: 303 10*3/uL (ref 150–400)
RDW: 14.8 % (ref 11.5–15.5)
WBC: 6.8 10*3/uL (ref 4.0–10.5)

## 2012-12-11 NOTE — Discharge Summary (Signed)
DISCHARGE SUMMARY  [name]  MR#: [id number]  DOB:1922-09-12  Date of Admission: 12/07/2012 Date of Discharge: 12/11/2012  Attending Physician:Barett Whidbee [name]  Patient's ZHY:QMVHQ,IONGEXB Hessie Diener, MD  Consults:Treatment Team:  Graylin Shiver, MD   Procedures: Upper endoscopy  Discharge Diagnoses: Active Problems:  GI bleed, probable upper source, stable History of atrial fibrillation, now off anticoagulation on a temporary basis Mild dementia Hypertension Hyperlipidemia Neuropathy History non-Hodgkin's lymphoma, in remission Osteoporosis Unstable gait Mild hyponatremia Mild protein malnutrition with albumin 2.6 Full CODE STATUS    Discharge Medications:   Medication List    STOP taking these medications       Rivaroxaban 20 MG Tabs  Commonly known as:  XARELTO      TAKE these medications       amLODipine 5 MG tablet  Commonly known as:  NORVASC  Take 5 mg by mouth daily.     atenolol 25 MG tablet  Commonly known as:  TENORMIN  Take 25 mg by mouth daily.     digoxin 0.125 MG tablet  Commonly known as:  LANOXIN  Take 125 mcg by mouth daily.     donepezil 5 MG tablet  Commonly known as:  ARICEPT  Take 5 mg by mouth.     fenofibrate 160 MG tablet  Take 160 mg by mouth.     fish oil-omega-3 fatty acids 1000 MG capsule  Take 1 g by mouth daily.     LYRICA 100 MG capsule  Generic drug:  pregabalin  Take 100 mg by mouth at bedtime.     pregabalin 75 MG capsule  Commonly known as:  LYRICA  Take 75 mg by mouth every morning.     multivitamin-prenatal 27-0.8 MG Tabs  Take 1 tablet by mouth daily.     oxyCODONE-acetaminophen 5-325 MG per tablet  Commonly known as:  PERCOCET/ROXICET  Take 1 tablet by mouth every 6 (six) hours as needed for pain.     pantoprazole 40 MG tablet  Commonly known as:  PROTONIX  Take 1 tablet (40 mg total) by mouth daily.     VITAMIN D PO  Take 1 tablet by mouth daily.        Hospital Procedures: Dg Abd  Acute W/chest  12/07/2012   *RADIOLOGY REPORT*  Clinical Data: Hematemesis.  Blood in stool.  Diarrhea.  ACUTE ABDOMEN SERIES (ABDOMEN 2 VIEW & CHEST 1 VIEW)  Comparison: Chest, 07/22/2009  Findings: Normal heart size and pulmonary vascularity. Hyperinflation of the lungs consistent with emphysematous changes. Scattered fibrosis in the lungs.  Chronic bronchitic changes.  No blunting of costophrenic angles.  No pneumothorax.  Mediastinal contours appear intact.  Surgical clips in the left upper quadrant. Stable appearance since previous chest.  Gas and stool in the colon with prominent gas filled rectosigmoid colon.  No small or large bowel distension.  No free intra- abdominal air.  No abnormal air fluid levels.  The calcified and tortuous aorta.  Surgical clips in the left upper quadrant and pelvis.  Degenerative changes in the spine and hips.  IMPRESSION: Prominent stool filled rectosigmoid colon with scattered stool throughout the rest of the colon.  No evidence of bowel obstruction or free air.  Chronic emphysematous and bronchitic changes in the lungs.  No active disease.   Original Report Authenticated By: Burman Nieves, M.D.    History of Present Illness:  [name] is an 79 yo female with dementia (started on aricept last week) and CVA, Lymphoma in remission, and atrial fibrillation  on chronic anticoagulation. She was switched from coumadin to xarelto 20 mg daily one week ago. She was feeling fine until yesterday. She started bleeding. She threw up blood . She has a poor memory. Her daughter says at 6:30 pm she told her she was sick. She went over and saw bright red blood in toilet. She may have had some stool with blood too. She had coffee ground emesis upon coming into the ER.  She has dropped her hemoglobin 4 gm already with this episode. She will require admit. She denies pain. No fever. Appetite has been normal lately. She normally ambulates with no assistive device   Hospital Course: Ms.  Rogers did well her hospitalization. She had no evidence of further acute bleeding. She did have a heme positive stool. She's not had a bowel movement in over a day. Her last bowel movement was dark. She's been hemodynamically stable. An upper endoscopy revealed only some mild gastritis. Her hemoglobin has remained stable. It is actually improved today. She's back on mechanical soft diet. She is eating well with no abdominal pain or nausea. She has no chest pain or shortness of breath. She is feeling back to her normal self. She is somewhat weak however and her rehabilitation stay is planned. This appears to be a self-limited bleed. She's been on anticoagulation for many years. The plan is to reintroduce Xarelto when she is clinically stable. No other GI investigations are planned unless she bleeds again. She's now on a PPI and will remain on this long term. She had no pulmonary or cardiac issues during this hospitalization. Her mentation appears about baseline. She's impaired somewhat by her hearing deficit. Her blood pressure is doing well.  Day of Discharge Exam BP 151/67  Pulse 62  Temp(Src) 98.1 F (36.7 C) (Oral)  Resp 18  Ht 5\' 6"  (1.676 m)  Wt 39.372 kg (86 lb 12.8 oz)  BMI 14.02 kg/m2  SpO2 98%  Physical Exam: General appearance: thin in no distress, face symmetric, oral membranes moist  Resp: clear no wheeze Cardio: fairly regular, distant GI: soft, non-tender; bowel sounds normal; no masses,  no organomegaly Extremities: no clubbing, cyanosis or edema, good pulses Neuro: awake, alert, mentating fairly well. Recognized me easily, speech clear. A bit confused as to the details of the hospitalization Skin is thin with some mild bruising  Discharge Labs:  Recent Labs  12/10/12 0435 12/11/12 0417  NA 131* 132*  K 3.4* 3.8  CL 97 99  CO2 28 28  GLUCOSE 83 87  BUN 18 11  CREATININE 0.69 0.63  CALCIUM 9.3 9.2    Recent Labs  12/10/12 0435 12/11/12 0417  AST 29 30  ALT  14 15  ALKPHOS 41 55  BILITOT 0.4 0.5  PROT 5.9* 6.3  ALBUMIN 2.6* 2.6*    Recent Labs  12/10/12 0435 12/11/12 0417  WBC 6.5 6.8  NEUTROABS 3.2 2.9  HGB 9.7* 10.2*  HCT 28.3* 30.0*  MCV 86.8 86.5  PLT 269 303  Results for [name], [name] (MRN [id number]) as of 12/11/2012 08:23  Ref. Range 12/11/2012 04:17  Glucose Latest Range: 70-99 mg/dl 87  Alkaline Phosphatase Latest Range: 39-117 U/L 55  Albumin Latest Range: 3.5-5.2 g/dL 2.6 (L)  AST Latest Range: 0-37 U/L 30  ALT Latest Range: 0-35 U/L 15  Total Protein Latest Range: 6.4-8.3 g/dL 6.3  Total Bilirubin Latest Range: 0.3-1.2 mg/dL 0.5   Lab Results  Component Value Date   INR 1.34 12/07/2012  INR 2.60* 07/24/2009   INR 4.12* 07/23/2009       Discharge instructions:     Discharge Orders   Future Appointments Provider Department Dept Phone   04/16/2013 1:30 PM Mauri Brooklyn [facility] MEDICAL ONCOLOGY [phone]   04/16/2013 2:00 PM Levert Feinstein, MD [facility] MEDICAL ONCOLOGY [phone]   Future Orders Complete By Expires     Call MD for:  difficulty breathing, headache or visual disturbances  As directed     Call MD for:  extreme fatigue  As directed     Call MD for:  persistant dizziness or light-headedness  As directed     Diet - low sodium heart healthy  As directed     Discharge instructions  As directed     Comments:      Hold xarelto until f/u w/ physician this week and have Hgb rechecked. Will likely resume at that time if Hgb stable    Increase activity slowly  As directed        Disposition: to SNF  Follow-up Appts: Follow-up with Dr. Evlyn Kanner at [facility] 2 wks after SNF discharge.  Call for appointment.  Condition on Discharge:improved  Tests Needing Follow-up: none  Signed: Yuliana Vandrunen [name] 12/11/2012, 8:15 AM

## 2012-12-11 NOTE — Clinical Social Work Placement (Signed)
     Clinical Social Work Department CLINICAL SOCIAL WORK PLACEMENT NOTE 12/11/2012  Patient:  [name], [name]  Account Number:  [id number] Admit date:  12/07/2012  Clinical Social Worker:  Doroteo Glassman  Date/time:  12/10/2012 01:08 PM  Clinical Social Work is seeking post-discharge placement for this patient at the following level of care:   SKILLED NURSING   (*CSW will update this form in Epic as items are completed)   12/10/2012  Patient/family provided with [facility] Department of Clinical Social Works list of facilities offering this level of care within the geographic area requested by the patient (or if unable, by the patients family).  12/10/2012  Patient/family informed of their freedom to choose among providers that offer the needed level of care, that participate in Medicare, Medicaid or managed care program needed by the patient, have an available bed and are willing to accept the patient.  12/10/2012  Patient/family informed of [facility] ownership interest in [facility], as well as of the fact that they are under no obligation to receive care at this facility.  PASARR submitted to EDS on 12/10/2012 PASARR number received from EDS on 12/10/2012  FL2 transmitted to all facilities in geographic area requested by pt/family on  12/10/2012 FL2 transmitted to all facilities within larger geographic area on   Patient informed that his/her managed care company has contracts with or will negotiate with  certain facilities, including the following:     Patient/family informed of bed offers received:  12/11/2012 Patient [facility] bed at [facility] Physician recommends and patient chooses bed at    Patient to be transferred to [facility] on  12/11/2012 Patient to be transferred to facility by family  The following physician request were entered in Epic:   Additional Comments:

## 2012-12-11 NOTE — Progress Notes (Signed)
Discharge to [facility], daughter Gabriel Rung transported the patient. Report given to [facility]. PIV removed no s/s of infiltration or swelling noted on insertion site. Packet was given to the daughter.

## 2012-12-11 NOTE — Progress Notes (Addendum)
CSW spoke with patient's family regarding bed offers. They are requesting [facility]. Notified [employer].  [place] Pandey C. Suzanne Kho MSW, LCSW [phone] auth received from [employer]. Amb[facility] #[phone] however, family is going to transport patient. Packet copied and placed in [place]. Family is thrilled that patient has a bed at [facility].  Avy Barlett C. Treyana Sturgell MSW, LCSW [phone]

## 2012-12-11 NOTE — Care Management Note (Signed)
    Page 1 of 2   12/11/2012     12:10:52 PM   CARE MANAGEMENT NOTE 12/11/2012  Patient:  [name], [name]   Account Number:  [id number]  Date Initiated:  12/08/2012  Documentation initiated by:  DAVIS,RHONDA  Subjective/Objective Assessment:   pt with active lower gi bleed requiring prcb x2.  History of recent medicine changes for a.fib to xarelto.     Action/Plan:   home with family support reports that she does live alone.   Anticipated DC Date:  12/11/2012   Anticipated DC Plan:  SKILLED NURSING FACILITY  In-house referral  NA      DC Planning Services  CM consult      PAC Choice  NA   Choice offered to / List presented to:  NA   DME arranged  NA      DME agency  NA     HH arranged  NA      HH agency  NA   Status of service:  Completed, signed off Medicare Important Message given?  NA - LOS <3 / Initial given by admissions (If response is "NO", the following Medicare IM given date fields will be blank) Date Medicare IM given:   Date Additional Medicare IM given:    Discharge Disposition:  SKILLED NURSING FACILITY  Per UR Regulation:  Reviewed for med. necessity/level of care/duration of stay  If discussed at Long Length of Stay Meetings, dates discussed:    Comments:  12/11/12 [facility] [place] RN,BSN NCM [phone] D/C SNF.  [id number] Earlene Plater, RN, BSN, CCN: [phone] Case management. Chart reviewed for discharge planning and present needs. Discharge needs: none present at time of review. Next chart review due:  [id number]

## 2012-12-20 ENCOUNTER — Other Ambulatory Visit: Payer: Self-pay

## 2013-03-22 ENCOUNTER — Other Ambulatory Visit: Payer: Self-pay

## 2013-04-02 ENCOUNTER — Emergency Department (HOSPITAL_COMMUNITY): Payer: Medicare Other

## 2013-04-02 ENCOUNTER — Encounter (HOSPITAL_COMMUNITY): Payer: Self-pay | Admitting: Emergency Medicine

## 2013-04-02 ENCOUNTER — Emergency Department (HOSPITAL_COMMUNITY)
Admission: EM | Admit: 2013-04-02 | Discharge: 2013-04-02 | Disposition: A | Discharge status: Home / Self Care | Payer: Medicare Other | Attending: Emergency Medicine | Admitting: Emergency Medicine

## 2013-04-02 DIAGNOSIS — Z880 Allergy status to penicillin: Secondary | ICD-10-CM | POA: Insufficient documentation

## 2013-04-02 DIAGNOSIS — Z9089 Acquired absence of other organs: Secondary | ICD-10-CM | POA: Insufficient documentation

## 2013-04-02 DIAGNOSIS — I10 Essential (primary) hypertension: Secondary | ICD-10-CM | POA: Insufficient documentation

## 2013-04-02 DIAGNOSIS — Z9221 Personal history of antineoplastic chemotherapy: Secondary | ICD-10-CM | POA: Insufficient documentation

## 2013-04-02 DIAGNOSIS — Z79899 Other long term (current) drug therapy: Secondary | ICD-10-CM | POA: Insufficient documentation

## 2013-04-02 DIAGNOSIS — Z8639 Personal history of other endocrine, nutritional and metabolic disease: Secondary | ICD-10-CM | POA: Insufficient documentation

## 2013-04-02 DIAGNOSIS — R112 Nausea with vomiting, unspecified: Secondary | ICD-10-CM | POA: Insufficient documentation

## 2013-04-02 DIAGNOSIS — R109 Unspecified abdominal pain: Secondary | ICD-10-CM | POA: Insufficient documentation

## 2013-04-02 DIAGNOSIS — Z862 Personal history of diseases of the blood and blood-forming organs and certain disorders involving the immune mechanism: Secondary | ICD-10-CM | POA: Insufficient documentation

## 2013-04-02 DIAGNOSIS — Z859 Personal history of malignant neoplasm, unspecified: Secondary | ICD-10-CM | POA: Insufficient documentation

## 2013-04-02 DIAGNOSIS — Z8739 Personal history of other diseases of the musculoskeletal system and connective tissue: Secondary | ICD-10-CM | POA: Insufficient documentation

## 2013-04-02 DIAGNOSIS — Z87898 Personal history of other specified conditions: Secondary | ICD-10-CM | POA: Insufficient documentation

## 2013-04-02 DIAGNOSIS — Z90710 Acquired absence of both cervix and uterus: Secondary | ICD-10-CM | POA: Insufficient documentation

## 2013-04-02 LAB — COMPREHENSIVE METABOLIC PANEL
ALT: 22 U/L (ref 0–35)
AST: 67 U/L — ABNORMAL HIGH (ref 0–37)
Alkaline Phosphatase: 44 U/L (ref 39–117)
BUN: 22 mg/dL (ref 6–23)
CO2: 28 mEq/L (ref 19–32)
Calcium: 10.3 mg/dL (ref 8.4–10.5)
Chloride: 99 mEq/L (ref 96–112)
GFR calc Af Amer: 73 mL/min — ABNORMAL LOW (ref >90)
GFR calc non Af Amer: 63 mL/min — ABNORMAL LOW (ref >90)
Glucose, Bld: 117 mg/dL — ABNORMAL HIGH (ref 70–99)
Potassium: 4.8 mEq/L (ref 3.5–5.1)
Sodium: 136 mEq/L (ref 135–145)
Total Bilirubin: 0.6 mg/dL (ref 0.3–1.2)

## 2013-04-02 LAB — CBC WITH DIFFERENTIAL/PLATELET
Eosinophils Absolute: 0 10*3/uL (ref 0.0–0.7)
HCT: 39 % (ref 36.0–46.0)
Hemoglobin: 13.5 g/dL (ref 12.0–15.0)
Lymphocytes Relative: 12 % (ref 12–46)
Lymphs Abs: 1 10*3/uL (ref 0.7–4.0)
MCH: 29.1 pg (ref 26.0–34.0)
Monocytes Relative: 7 % (ref 3–12)
Neutro Abs: 7.1 10*3/uL (ref 1.7–7.7)
Neutrophils Relative %: 81 % — ABNORMAL HIGH (ref 43–77)
Platelets: 279 10*3/uL (ref 150–400)
RBC: 4.64 MIL/uL (ref 3.87–5.11)
WBC: 8.7 10*3/uL (ref 4.0–10.5)

## 2013-04-02 LAB — LIPASE, BLOOD: Lipase: 46 U/L (ref 11–59)

## 2013-04-02 MED ORDER — LANSOPRAZOLE 15 MG PO CPDR: DELAYED_RELEASE_CAPSULE | ORAL | Status: DC

## 2013-04-02 MED ORDER — BARIUM SULFATE 2 % PO SUSP
450.0000 mL | ORAL | Status: AC
  Administered 2013-04-02 (×2): 450 mL via ORAL
  Filled 2013-04-02: qty 450

## 2013-04-02 MED ORDER — SODIUM CHLORIDE 0.9 % IV SOLN
INTRAVENOUS | Status: DC
  Administered 2013-04-02: 11:00:00 via INTRAVENOUS

## 2013-04-02 MED ORDER — MORPHINE SULFATE 2 MG/ML IJ SOLN
2.0000 mg | INTRAMUSCULAR | Status: DC | PRN
  Administered 2013-04-02: 2 mg via INTRAVENOUS
  Filled 2013-04-02: qty 1

## 2013-04-02 MED ORDER — ONDANSETRON HCL 4 MG/2ML IJ SOLN
4.0000 mg | Freq: Once | INTRAMUSCULAR | Status: AC
  Administered 2013-04-02: 4 mg via INTRAVENOUS
  Filled 2013-04-02: qty 2

## 2013-04-02 NOTE — ED Provider Notes (Signed)
CSN: [id number]     Arrival date & time 04/02/13  4098 History   First MD Initiated Contact with Patient 04/02/13 1015     Chief Complaint  Patient presents with  . Abdominal Pain  . Nausea  . Emesis   Level V caveat for dementia  (Consider location/radiation/quality/duration/timing/severity/associated sxs/prior Treatment) HPI Daughter reports patient was placed o xarelto in July and within a week had GI bleeding with vomiting of blood. She had endoscopy which showed mild erosions and was started on Prevacid. Patient was sent to [facility] for rehabilitation to get her strength back. She did not have to have a blood transfusion. She reports her mother has been complaining the past few weeks that she feels like her food gets stuck and indicates in her abdominal area, not in her  chest or her throat. She also has been complaining that her abdomen feels tight however 3 days ago she started complaining of pain just above her umbilicus and it goes across her abdomen from right to left. Yesterday she started getting worse and the pain become constant. Her daughter states watching her  she appears to have waxing and waning of discomfort. She has not been able to eat because of the pain. Nothing makes the pain feel worse such as coughing or eating. She does report heat and burping help briefly. She does not have nausea but has had vomiting x3 again without blood. She states she had a bowel movement yesterday and the day before and they may have been firmer than normal but otherwise her bowel movements have been irregular.  Daughter states patient had lymphoma diagnosed 30 years ago when she was having similar type of abdominal pain. She has had her spleen removed. She did receive chemotherapy at that time. She has been followed by Dr. Cyndie Chime  and has been in remission for about 13 years. Patient has had several abdominal surgeries including hysterectomy, appendectomy and the  splenectomy.  PCP Dr Evlyn Kanner  Past Medical History  Diagnosis Date  . Cancer   . Lymphoma, high grade 07/23/2011  . Benign essential HTN 07/23/2011  . Hyperlipidemia 07/23/2011  . 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 DJD (degenerative joint disease), lumbar 07/23/2011  . Current non-smoker but past smoking history unknown 12/08/12   Past Surgical History  Procedure Laterality Date  . Colon surgery    . Appendectomy    . Abdominal hysterectomy    . Hernia repair    . Tonsillectomy    . Splenectomy    . Shoulder surgery    . Hemorroidectomy    . Esophagogastroduodenoscopy N/A 12/08/2012    Procedure: ESOPHAGOGASTRODUODENOSCOPY (EGD);  Surgeon: Graylin Shiver, MD;  Location: [facility];  Service: Endoscopy;  Laterality: N/A;   No family history on file. History  Substance Use Topics  . Smoking status: Never Smoker   . Smokeless tobacco: Not on file  . Alcohol Use: No  lives at home Lives alone (daughter lives next door)   OB History   Grav Para Term Preterm Abortions TAB SAB Ect Mult Living                 Review of Systems  All other systems reviewed and are negative.    Allergies  Iodine; Penicillins; and Shellfish-derived products  Home Medications   Current Outpatient Rx  Name  Route  Sig  Dispense  Refill  . amLODipine (NORVASC) 5 MG tablet   Oral   Take 5 mg by mouth daily.          Marland Kitchen  atenolol (TENORMIN) 25 MG tablet   Oral   Take 25 mg by mouth daily.          . Cholecalciferol (VITAMIN D PO)   Oral   Take 1 tablet by mouth daily.         . digoxin (LANOXIN) 0.125 MG tablet   Oral   Take 125 mcg by mouth daily.          Marland Kitchen donepezil (ARICEPT) 10 MG tablet   Oral   Take 10 mg by mouth at bedtime.         . fenofibrate 160 MG tablet   Oral   Take 160 mg by mouth.          . fish oil-omega-3 fatty acids 1000 MG capsule   Oral   Take 1 g by mouth daily.         . lansoprazole (PREVACID) 15 MG capsule   Oral   Take 15 mg by  mouth daily at 12 noon.         Marland Kitchen LYRICA 100 MG capsule   Oral   Take 100 mg by mouth at bedtime.          Marland Kitchen oxyCODONE-acetaminophen (PERCOCET/ROXICET) 5-325 MG per tablet   Oral   Take 1 tablet by mouth every 6 (six) hours as needed for pain.         . Prenatal Vit-Fe Fumarate-FA (MULTIVITAMIN-PRENATAL) 27-0.8 MG TABS   Oral   Take 1 tablet by mouth daily.          BP 140/76  Pulse 75  Temp(Src) 97.9 F (36.6 C) (Oral)  SpO2 95%  Vital signs normal   Physical Exam  Nursing note and vitals reviewed. Constitutional: She is oriented to person, place, and time.  Non-toxic appearance. She does not appear ill. No distress.  Frail elderly female  HENT:  Head: Normocephalic and atraumatic.  Right Ear: External ear normal.  Left Ear: External ear normal.  Nose: Nose normal. No mucosal edema or rhinorrhea.  Mouth/Throat: Oropharynx is clear and moist and mucous membranes are normal. No dental abscesses or uvula swelling.  Eyes: Conjunctivae and EOM are normal. Pupils are equal, round, and reactive to light.  Neck: Normal range of motion and full passive range of motion without pain. Neck supple.  Cardiovascular: Normal rate, regular rhythm and normal heart sounds.  Exam reveals no gallop and no friction rub.   No murmur heard. Pulmonary/Chest: Effort normal and breath sounds normal. No respiratory distress. She has no wheezes. She has no rhonchi. She has no rales. She exhibits no tenderness and no crepitus.  Abdominal: Soft. Normal appearance and bowel sounds are normal. She exhibits no distension. There is no tenderness. There is no rebound and no guarding.  Palpation of the right lower quadrant causes pain in the left lower quadrant. Palpation of the left lower quadrant produces no pain. Palpation of the suprapubic area causes pain in the upper abdomen. She appears to be nontender in the upper abdomen but that is where she states her pain is located.  Musculoskeletal: Normal  range of motion. She exhibits no edema and no tenderness.  Moves all extremities well.   Neurological: She is alert and oriented to person, place, and time. She has normal strength. No cranial nerve deficit.  Skin: Skin is warm, dry and intact. No rash noted. No erythema. No pallor.  Psychiatric: She has a normal mood and affect. Her speech is normal and behavior is normal.  Her mood appears not anxious.    ED Course  Procedures (including critical care time)  Medications  0.9 %  sodium chloride infusion ( Intravenous New Bag/Given 04/02/13 1105)  morphine 2 MG/ML injection 2 mg (2 mg Intravenous Given 04/02/13 1110)  ondansetron (ZOFRAN) injection 4 mg (4 mg Intravenous Given 04/02/13 1110)  barium (READI-CAT 2) 2 % suspension 450 mL (450 mLs Oral Given by Other 04/02/13 1330)    Discussed lab results and xray and [place] results. We discussed need for CT scan and they are agreeable.   16:00 Pt smiling stating she feels better, waiting for CT results.   Daughter and pt given results of CT scan, will increase her prevacid to twice a day and if not improving may need repeat EDG.    Labs Review Results for orders placed during the hospital encounter of 04/02/13  CBC WITH DIFFERENTIAL      Result Value Range   WBC 8.7  4.0 - 10.5 K/uL   RBC 4.64  3.87 - 5.11 MIL/uL   Hemoglobin 13.5  12.0 - 15.0 g/dL   HCT 16.1  09.6 - 04.5 %   MCV 84.1  78.0 - 100.0 fL   MCH 29.1  26.0 - 34.0 pg   MCHC 34.6  30.0 - 36.0 g/dL   RDW 40.9 (*) 81.1 - 91.4 %   Platelets 279  150 - 400 K/uL   Neutrophils Relative % 81 (*) 43 - 77 %   Neutro Abs 7.1  1.7 - 7.7 K/uL   Lymphocytes Relative 12  12 - 46 %   Lymphs Abs 1.0  0.7 - 4.0 K/uL   Monocytes Relative 7  3 - 12 %   Monocytes Absolute 0.6  0.1 - 1.0 K/uL   Eosinophils Relative 0  0 - 5 %   Eosinophils Absolute 0.0  0.0 - 0.7 K/uL   Basophils Relative 0  0 - 1 %   Basophils Absolute 0.0  0.0 - 0.1 K/uL  COMPREHENSIVE METABOLIC PANEL      Result  Value Range   Sodium 136  135 - 145 mEq/L   Potassium 4.8  3.5 - 5.1 mEq/L   Chloride 99  96 - 112 mEq/L   CO2 28  19 - 32 mEq/L   Glucose, Bld 117 (*) 70 - 99 mg/dL   BUN 22  6 - 23 mg/dL   Creatinine, Ser 7.82  0.50 - 1.10 mg/dL   Calcium 95.6  8.4 - 21.3 mg/dL   Total Protein 7.8  6.0 - 8.3 g/dL   Albumin 3.2 (*) 3.5 - 5.2 g/dL   AST 67 (*) 0 - 37 U/L   ALT 22  0 - 35 U/L   Alkaline Phosphatase 44  39 - 117 U/L   Total Bilirubin 0.6  0.3 - 1.2 mg/dL   GFR calc non Af Amer 63 (*) >90 mL/min   GFR calc Af Amer 73 (*) >90 mL/min  LIPASE, BLOOD      Result Value Range   Lipase 46  11 - 59 U/L   Laboratory interpretation all normal except elevated lft's c/w hemolysis noted on CMET.     Imaging Review US Abdomen Complete  04/02/2013   CLINICAL DATA:  Upper abdominal pain.  Nausea.  EXAM: ULTRASOUND ABDOMEN COMPLETE  COMPARISON:  CT, 05/1909  FINDINGS: Gallbladder  No gallstones or wall thickening visualized. No sonographic Murphy sign noted.  Common bile duct  Diameter: 3 mm.  Liver  No focal  lesion identified. Within normal limits in parenchymal echogenicity.  IVC  Within normal limits  Pancreas  Incomplete visualization.  Visualized portions are unremarkable.  Spleen  Size and appearance within normal limits.  Right Kidney  Length: 8.8 cm. Increased renal parenchymal echogenicity. No mass, stone or hydronephrosis.  Left Kidney  Length: 9.9 cm. Increased renal parenchymal echogenicity. No mass, stone or hydronephrosis.  Abdominal aorta  Atherosclerotic calcification and irregularity.  No aneurysm.  Small amount of ascites adjacent to the liver.  IMPRESSION: 1. No acute findings. Normal gallbladder with no bile duct dilation. 2. Echogenic kidneys consistent with medical renal disease. 3. Incomplete visualization of pancreas. 4. Small amount of ascites.   Electronically Signed   By: Amie Portland M.D.   On: 04/02/2013 12:36   Dg Abd Acute W/chest  04/02/2013   CLINICAL DATA:  Pain.   Weakness.  EXAM: ACUTE ABDOMEN SERIES (ABDOMEN 2 VIEW & CHEST 1 VIEW)  COMPARISON:  12/07/2012.  FINDINGS: Mediastinum and hilar structures normal. Chronic interstitial prominence noted consistent with interstitial fibrosis. Changes suggesting COPD. Stable nodularity suggests the possibility of prior granulomatous disease. No pneumothorax. Cardiomegaly, no CHF.  Surgical clips left upper quadrant. Gas pattern is nonspecific with stool throughout the colon. No free air. Severe thoracolumbar and bilateral hip degenerative change. Surgical clips left lower pelvis. Aortoiliac atherosclerotic vascular disease.  IMPRESSION: 1. No acute abdominal abnormality identified. 2. Chronic interstitial lung disease and COPD.   Electronically Signed   By: Maisie Fus  Register   On: 04/02/2013 11:27    Ct Abdomen Pelvis Wo Contrast  04/02/2013   CLINICAL DATA:  Abdominal pain and nausea.  EXAM: CT ABDOMEN AND PELVIS WITHOUT CONTRAST  TECHNIQUE: Multidetector CT imaging of the abdomen and pelvis was performed following the standard protocol without intravenous contrast.  COMPARISON:  06/04/2009  FINDINGS: The lung bases demonstrate multiple bilateral pulmonary nodules, peribronchial thickening and tree-in-bud appearance likely reflecting a chronic inflammatory process or atypical infection. No pleural effusion.  The unenhanced appearance of the liver is grossly normal. The gallbladder is unremarkable. No common bile duct dilatation. The pancreas is grossly normal. The spleen is surgically absent. The adrenal glands and kidneys are grossly normal. No obvious ureteral or bladder calculi.  The stomach moderate wall thickening in the body region with thickened appearing gastric folds. Could not exclude an inflammatory or infiltrative process. Upper endoscopy may be helpful for further evaluation. The duodenum, small bowel and colon are grossly normal. No obvious inflammatory changes, mass lesions or obstructive findings. The appendix is  surgically absent. No mesenteric or retroperitoneal mass or adenopathy. Advanced atherosclerotic calcifications involving the aorta and branch vessels.  The uterus is surgically absent. The bladder is unremarkable. No pelvic mass or adenopathy. No significant free pelvic fluid collections. No inguinal mass or adenopathy.  The lumbar spine demonstrates stable Schmorl's nodes, Osteo poor [place] and compression fractures.  IMPRESSION: Persistent chronic inflammatory changes involving the lung bases.  No acute abdominal/ pelvic findings, mass lesions or adenopathy.  Apparent wall thickening in the body region of the stomach. Could not exclude a inflammatory or infiltrative process. Upper endoscopy may be helpful for further evaluation.   Electronically Signed   By: Loralie Champagne M.D.   On: 04/02/2013 16:20        EKG Interpretation   None       MDM   1. Abdominal pain    New Prescriptions   LANSOPRAZOLE (PREVACID) 15 MG CAPSULE    Take 1 po BID x 2  weeks then once a day     Plan discharge  Devoria Albe, MD, Franz Dell, MD 04/02/13 [phone]

## 2013-04-02 NOTE — ED Notes (Signed)
Pt c/o of mid abd pain nonradiating. Nausea, vomiting that started this am. Hx of lymphoma.

## 2013-04-09 ENCOUNTER — Encounter (HOSPITAL_COMMUNITY): Payer: Self-pay | Admitting: *Deleted

## 2013-04-09 ENCOUNTER — Other Ambulatory Visit: Payer: Self-pay | Admitting: Endocrinology

## 2013-04-09 ENCOUNTER — Observation Stay (HOSPITAL_COMMUNITY)
Admission: AD | Admit: 2013-04-09 | Discharge: 2013-04-13 | Disposition: A | Discharge status: Home / Self Care | Payer: Medicare Other | Source: Ambulatory Visit | Attending: Endocrinology | Admitting: Endocrinology

## 2013-04-09 DIAGNOSIS — I10 Essential (primary) hypertension: Secondary | ICD-10-CM | POA: Insufficient documentation

## 2013-04-09 DIAGNOSIS — C8589 Other specified types of non-Hodgkin lymphoma, extranodal and solid organ sites: Secondary | ICD-10-CM | POA: Insufficient documentation

## 2013-04-09 DIAGNOSIS — E441 Mild protein-calorie malnutrition: Secondary | ICD-10-CM | POA: Diagnosis present

## 2013-04-09 DIAGNOSIS — E861 Hypovolemia: Secondary | ICD-10-CM | POA: Insufficient documentation

## 2013-04-09 DIAGNOSIS — R269 Unspecified abnormalities of gait and mobility: Secondary | ICD-10-CM | POA: Diagnosis not present

## 2013-04-09 DIAGNOSIS — R630 Anorexia: Secondary | ICD-10-CM | POA: Diagnosis not present

## 2013-04-09 DIAGNOSIS — I6990 Unspecified sequelae of unspecified cerebrovascular disease: Secondary | ICD-10-CM | POA: Diagnosis not present

## 2013-04-09 DIAGNOSIS — F0390 Unspecified dementia without behavioral disturbance: Secondary | ICD-10-CM | POA: Insufficient documentation

## 2013-04-09 DIAGNOSIS — E876 Hypokalemia: Secondary | ICD-10-CM | POA: Insufficient documentation

## 2013-04-09 DIAGNOSIS — R634 Abnormal weight loss: Secondary | ICD-10-CM | POA: Insufficient documentation

## 2013-04-09 DIAGNOSIS — E049 Nontoxic goiter, unspecified: Secondary | ICD-10-CM | POA: Diagnosis not present

## 2013-04-09 DIAGNOSIS — Z79899 Other long term (current) drug therapy: Secondary | ICD-10-CM | POA: Diagnosis not present

## 2013-04-09 DIAGNOSIS — R627 Adult failure to thrive: Secondary | ICD-10-CM | POA: Diagnosis present

## 2013-04-09 DIAGNOSIS — R109 Unspecified abdominal pain: Secondary | ICD-10-CM | POA: Insufficient documentation

## 2013-04-09 DIAGNOSIS — Z9049 Acquired absence of other specified parts of digestive tract: Secondary | ICD-10-CM | POA: Insufficient documentation

## 2013-04-09 DIAGNOSIS — E43 Unspecified severe protein-calorie malnutrition: Secondary | ICD-10-CM | POA: Diagnosis not present

## 2013-04-09 DIAGNOSIS — J449 Chronic obstructive pulmonary disease, unspecified: Secondary | ICD-10-CM | POA: Insufficient documentation

## 2013-04-09 DIAGNOSIS — M810 Age-related osteoporosis without current pathological fracture: Secondary | ICD-10-CM | POA: Diagnosis not present

## 2013-04-09 DIAGNOSIS — I4891 Unspecified atrial fibrillation: Secondary | ICD-10-CM | POA: Insufficient documentation

## 2013-04-09 DIAGNOSIS — E871 Hypo-osmolality and hyponatremia: Secondary | ICD-10-CM | POA: Insufficient documentation

## 2013-04-09 DIAGNOSIS — R2681 Unsteadiness on feet: Secondary | ICD-10-CM | POA: Diagnosis present

## 2013-04-09 DIAGNOSIS — Z860100 Personal history of colon polyps, unspecified: Secondary | ICD-10-CM | POA: Insufficient documentation

## 2013-04-09 DIAGNOSIS — Z9221 Personal history of antineoplastic chemotherapy: Secondary | ICD-10-CM | POA: Diagnosis not present

## 2013-04-09 DIAGNOSIS — K2970 Gastritis, unspecified, without bleeding: Secondary | ICD-10-CM

## 2013-04-09 DIAGNOSIS — K2940 Chronic atrophic gastritis without bleeding: Principal | ICD-10-CM | POA: Insufficient documentation

## 2013-04-09 DIAGNOSIS — J4489 Other specified chronic obstructive pulmonary disease: Secondary | ICD-10-CM | POA: Insufficient documentation

## 2013-04-09 DIAGNOSIS — E46 Unspecified protein-calorie malnutrition: Secondary | ICD-10-CM

## 2013-04-09 DIAGNOSIS — A048 Other specified bacterial intestinal infections: Secondary | ICD-10-CM | POA: Insufficient documentation

## 2013-04-09 DIAGNOSIS — D649 Anemia, unspecified: Secondary | ICD-10-CM | POA: Insufficient documentation

## 2013-04-09 DIAGNOSIS — E785 Hyperlipidemia, unspecified: Secondary | ICD-10-CM | POA: Insufficient documentation

## 2013-04-09 DIAGNOSIS — M4800 Spinal stenosis, site unspecified: Secondary | ICD-10-CM | POA: Diagnosis not present

## 2013-04-09 DIAGNOSIS — C8590 Non-Hodgkin lymphoma, unspecified, unspecified site: Secondary | ICD-10-CM | POA: Diagnosis present

## 2013-04-09 MED ORDER — SODIUM CHLORIDE 0.9 % IV SOLN
INTRAVENOUS | Status: DC
  Administered 2013-04-09 – 2013-04-11 (×4): via INTRAVENOUS

## 2013-04-09 MED ORDER — DIGOXIN 125 MCG PO TABS
125.0000 ug | ORAL_TABLET | Freq: Every day | ORAL | Status: DC
  Administered 2013-04-10 – 2013-04-13 (×4): 125 ug via ORAL
  Filled 2013-04-09 (×4): qty 1

## 2013-04-09 MED ORDER — PRENATAL 27-0.8 MG PO TABS
1.0000 | ORAL_TABLET | Freq: Every day | ORAL | Status: DC
  Administered 2013-04-11 – 2013-04-13 (×3): 1 via ORAL
  Filled 2013-04-09 (×4): qty 1

## 2013-04-09 MED ORDER — ENOXAPARIN SODIUM 30 MG/0.3ML ~~LOC~~ SOLN
30.0000 mg | SUBCUTANEOUS | Status: DC
  Administered 2013-04-09 – 2013-04-12 (×4): 30 mg via SUBCUTANEOUS
  Filled 2013-04-09 (×5): qty 0.3

## 2013-04-09 MED ORDER — OXYCODONE-ACETAMINOPHEN 5-325 MG PO TABS
1.0000 | ORAL_TABLET | Freq: Four times a day (QID) | ORAL | Status: DC | PRN
  Administered 2013-04-09 – 2013-04-13 (×2): 1 via ORAL
  Filled 2013-04-09 (×2): qty 1

## 2013-04-09 MED ORDER — PANTOPRAZOLE SODIUM 40 MG PO TBEC
40.0000 mg | DELAYED_RELEASE_TABLET | Freq: Every day | ORAL | Status: DC
  Administered 2013-04-10 – 2013-04-11 (×2): 40 mg via ORAL
  Filled 2013-04-09 (×3): qty 1

## 2013-04-09 MED ORDER — PREGABALIN 50 MG PO CAPS
100.0000 mg | ORAL_CAPSULE | Freq: Every day | ORAL | Status: DC
  Administered 2013-04-09 – 2013-04-12 (×4): 100 mg via ORAL
  Filled 2013-04-09 (×4): qty 2

## 2013-04-09 NOTE — H&P (Signed)
PCP:   Julian Hy, MD   Chief Complaint:  Abdominal pain  HPI:   History of Present Illness  History from: patient Reason for visit: See chief complaint Chief Complaint: Patient stated she was in the hospital last monday. Not feeling better, pain in upper abdomen, not eating and diarrhea. No refills needed.  History of Present Illness: Went to ER last Monday for abdominal pain/n/v.  They did a CT and abdominal ultrasound-no acute findings other than thickening of the stomach-radiologist recommending endoscopy.  Dgtr says no vomitting since but she is now barely eating at all.  She c/o discomfort in her abdomen and doesn't want to eat.  No choking or difficulty swallowing.  She sips on boost but not enough to sustain.  Dgtr wanted to take her to ER yesterday evening.  She had an upper GI bleed in July with endoscopy showing erosion.    Results for TAYTUM, SCHECK (MRN [id number]) as of 04/09/2013 16:41  04/02/2013 11:10 Sodium: 136 Potassium: 4.8 Chloride: 99 CO2: 28 BUN: 22 Creatinine: 0.81 Calcium: 10.3 GFR calc non Af Amer: 63 (L) GFR calc Af Amer: 73 (L) Glucose: 117 (H) Alkaline Phosphatase: 44 Albumin: 3.2 (L) Lipase: 46 AST: 67 (H) ALT: 22 Total Protein: 7.8 Total Bilirubin: 0.6 WBC: 8.7 RBC: 4.64 Hemoglobin: 13.5 HCT: 39.0 MCV: 84.1 MCH: 29.1 MCHC: 34.6 RDW: 16.5 (H) Platelets: 279 Review of Systems:  Review of Systems - General:       Complains of anorexia, fatigue, malaise, weight loss.        Denies fevers, chills, headache, sweats.   Ears/Nose/Throat:       Denies sore throat, hoarseness, dysphagia.   Cardiovascular:       Denies chest pains.   Respiratory:       Denies cough, dyspnea.   Gastrointestinal:       Complains of see HPI, diarrhea, heartburn, abdominal pain.        Denies nausea, vomiting, change in bowel habits, melena, hematochezia.        some burping, clearing of throat Musculoskeletal:       Complains of see HPI, back  pain, arthritis.   Neurologic:       no falls Psychiatric:       Complains of memory loss.    Past Medical History: Past Medical History  Diagnosis Date  . Cancer   . Lymphoma, high grade 07/23/2011  . Benign essential HTN 07/23/2011  . Hyperlipidemia 07/23/2011  . 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 DJD (degenerative joint disease), lumbar 07/23/2011  . Current non-smoker but past smoking history unknown 12/08/12  Past Medical History (reviewed - no changes required): CVA with minimal sequelae Left inguinal hernia 2010 Lymphoma, in remisssion . Non-Hodgkin's lymphoma with a Scientist, research (life sciences).  This started back in 1988.  She had her spleen out at that time.  She had chemotherapy with consolidation therapy with the last episode back in 2001.  It was a low grade B-cell lymphoma, non-Hodgkin's lymphoma.  Her last medication was Zevalin in January 2002.  She had CHOP as well as that Colon Polyps 2005  IMPRESSION:  1. Diminutive rectal polyp removed as described above (211.4).  2. Family history of colon cancer.  3. Prior history of colonic adenoma.  4. Status post sigmoid colectomy for diverticular stricturing.   Date Received: 08/28/2003   RECTUM: ADENOMATOUS POLYP(S). NO HIGH GRADE DYSPLASIA OR INVASIVE MALIGNANCY IDENTIFIED. Two pregnancies and two deliveries Breast biopsy in 1945, benign Diverticulosis Pneumonia  Past Surgical  History  Procedure Laterality Date  . Colon surgery    . Appendectomy    . Abdominal hysterectomy    . Hernia repair    . Tonsillectomy    . Splenectomy    . Shoulder surgery    . Hemorroidectomy    . Esophagogastroduodenoscopy N/A 12/08/2012    Procedure: ESOPHAGOGASTRODUODENOSCOPY (EGD);  Surgeon: Graylin Shiver, MD;  Location: [facility];  Service: Endoscopy;  Laterality: N/A;   urgical History (reviewed - no changes required):  Tonsillectomy Splenectomy  Appendectomy Total abdominal hysterectomy Colectomy Lumpectomy Shoulder surgery  2005  OPERATION:  1. Right shoulder arthroscopy with debridement of labrum, biceps tendon and     shaving of underneath surface of rotator cuff.  2. Arthroscopic subacromial decompression.  3. Mini open rotator cuff repair. Hemorrhoidectomy 2010  Repair left inguinal hernia with mesh  Armanda Heritage repair).  Medications: Prior to Admission medications   Medication Sig Start Date End Date Taking? Authorizing Provider  amLODipine (NORVASC) 5 MG tablet Take 5 mg by mouth daily.  06/02/11   Historical Provider, MD  atenolol (TENORMIN) 25 MG tablet Take 25 mg by mouth daily.  06/02/11   Historical Provider, MD  Cholecalciferol (VITAMIN D PO) Take 1 tablet by mouth daily.    Historical Provider, MD  digoxin (LANOXIN) 0.125 MG tablet Take 125 mcg by mouth daily.  06/10/11   Historical Provider, MD  donepezil (ARICEPT) 10 MG tablet Take 10 mg by mouth at bedtime.    Historical Provider, MD  fenofibrate 160 MG tablet Take 160 mg by mouth.  06/02/11   Historical Provider, MD  fish oil-omega-3 fatty acids 1000 MG capsule Take 1 g by mouth daily.    Historical Provider, MD  lansoprazole (PREVACID) 15 MG capsule Take 15 mg by mouth daily at 12 noon.    Historical Provider, MD  lansoprazole (PREVACID) 15 MG capsule Take 1 po BID x 2 weeks then once a day 04/02/13   Ward Givens, MD  lansoprazole (PREVACID) 15 MG capsule Take 1 po BID x 2 weeks then once a day 04/02/13   Ward Givens, MD  LYRICA 100 MG capsule Take 100 mg by mouth at bedtime.  07/09/11   Historical Provider, MD  oxyCODONE-acetaminophen (PERCOCET/ROXICET) 5-325 MG per tablet Take 1 tablet by mouth every 6 (six) hours as needed for pain.    Historical Provider, MD  Prenatal Vit-Fe Fumarate-FA (MULTIVITAMIN-PRENATAL) 27-0.8 MG TABS Take 1 tablet by mouth daily.    Historical Provider, MD    Allergies:   Allergies  Allergen Reactions  . Iodine     Fever , chills  . Penicillins     Unknown   . Shellfish-Derived Products     Unknown      Social History:Social History (reviewed - no changes required):  widowed 2011 after 66 yrs  with two children and two grandchildren. Nonsmoker   Family History:Family History (reviewed - no changes required):  Diabetes Heart disease Cancer Hypertension    Physical Exam:   Vital Signs  Entered weight:  80  lbs., 4 oz.  Calculated Weight: 80.25 lbs., ( 36.40 kg) Height: 62.25 in., ( 158.12 cm) Temperature: 97.4 deg F, Temperature site: oral Pulse rate: 86 Pulse rhythm: regular  Blood Pressure #1: 106 / 60 mm Hg    BMI: 14.56 BSA: 1.30 Wt Chg: -9.75 lbs since 01/01/2013  Physical Exam  General appearance: frail WF in no distress. unsteady  Eyes  External: sclera anicteic, congugate gaze  Ears, Nose  and Throat  External ears: normal, no lesions or deformities External nose: normal, no lesions or deformities Hearing: grossly intact Pharynx: dry oropharynx--tongue normal, protrudes mid line,  posterior pharynx without erythema or exudate  Neck  Neck: supple, no masses, trachea midline Thyroid: felt on left with swallowing  Respiratory  Respiratory effort: no intercostal retractions or use of accessory muscles Auscultation: no rales, rhonchi, or wheezes  Cardiovascular  Auscultation: sl irreg hyperdynamic Pedal pulses: pulses 2+, symmetric Periph. circulation: no cyanosis, clubbing, edema  Gastrointestinal  Abdomen: scaphoid soft no masses or pulsations Liver and spleen: no enlargement or nodularity  Lymphatic  Neck: no cervical adenopathy  Musculoskeletal  Gait and station: unsteady,needs help changing positions Spine, ribs, pelvis: able to lie flat on exam table, get up and down with minimal assistance RUE: thin LUE: thin RLE: tihin LLE: thin  Skin  Inspection: thin no pigmentary issues Speech: intact, fluent  Mental Status Exam  Judgment, insight: intact Orientation: intact Memory: fair Mood and affect: withdrawn Mentation: processing a bit  slowly  Labs on Admission:  04/02/2013 11:10 Sodium: 136 Potassium: 4.8 Chloride: 99 CO2: 28 BUN: 22 Creatinine: 0.81 Calcium: 10.3 GFR calc non Af Amer: 63 (L) GFR calc Af Amer: 73 (L) Glucose: 117 (H) Alkaline Phosphatase: 44 Albumin: 3.2 (L) Lipase: 46 AST: 67 (H) ALT: 22 Total Protein: 7.8 Total Bilirubin: 0.6 WBC: 8.7 RBC: 4.64 Hemoglobin: 13.5 HCT: 39.0 MCV: 84.1 MCH: 29.1 MCHC: 34.6 RDW: 16.5 (H) Platelets: 279  In OUR OFFICE TODAY WBC 8.30, HGB 13.8 PLT 298K TSH 0.95    Radiological Exams on Admission: Ct Abdomen Pelvis Wo Contrast  04/02/2013   CLINICAL DATA:  Abdominal pain and nausea.  EXAM: CT ABDOMEN AND PELVIS WITHOUT CONTRAST  TECHNIQUE: Multidetector CT imaging of the abdomen and pelvis was performed following the standard protocol without intravenous contrast.  COMPARISON:  06/04/2009  FINDINGS: The lung bases demonstrate multiple bilateral pulmonary nodules, peribronchial thickening and tree-in-bud appearance likely reflecting a chronic inflammatory process or atypical infection. No pleural effusion.  The unenhanced appearance of the liver is grossly normal. The gallbladder is unremarkable. No common bile duct dilatation. The pancreas is grossly normal. The spleen is surgically absent. The adrenal glands and kidneys are grossly normal. No obvious ureteral or bladder calculi.  The stomach moderate wall thickening in the body region with thickened appearing gastric folds. Could not exclude an inflammatory or infiltrative process. Upper endoscopy may be helpful for further evaluation. The duodenum, small bowel and colon are grossly normal. No obvious inflammatory changes, mass lesions or obstructive findings. The appendix is surgically absent. No mesenteric or retroperitoneal mass or adenopathy. Advanced atherosclerotic calcifications involving the aorta and branch vessels.  The uterus is surgically absent. The bladder is unremarkable. No pelvic mass or  adenopathy. No significant free pelvic fluid collections. No inguinal mass or adenopathy.  The lumbar spine demonstrates stable Schmorl's nodes, Osteo poor Oasis and compression fractures.  IMPRESSION: Persistent chronic inflammatory changes involving the lung bases.  No acute abdominal/ pelvic findings, mass lesions or adenopathy.  Apparent wall thickening in the body region of the stomach. Could not exclude a inflammatory or infiltrative process. Upper endoscopy may be helpful for further evaluation.   Electronically Signed   By: Loralie Champagne M.D.   On: 04/02/2013 16:20   US Abdomen Complete  04/02/2013   CLINICAL DATA:  Upper abdominal pain.  Nausea.  EXAM: ULTRASOUND ABDOMEN COMPLETE  COMPARISON:  CT, 05/1909  FINDINGS: Gallbladder  No gallstones or wall thickening  visualized. No sonographic Murphy sign noted.  Common bile duct  Diameter: 3 mm.  Liver  No focal lesion identified. Within normal limits in parenchymal echogenicity.  IVC  Within normal limits  Pancreas  Incomplete visualization.  Visualized portions are unremarkable.  Spleen  Size and appearance within normal limits.  Right Kidney  Length: 8.8 cm. Increased renal parenchymal echogenicity. No mass, stone or hydronephrosis.  Left Kidney  Length: 9.9 cm. Increased renal parenchymal echogenicity. No mass, stone or hydronephrosis.  Abdominal aorta  Atherosclerotic calcification and irregularity.  No aneurysm.  Small amount of ascites adjacent to the liver.  IMPRESSION: 1. No acute findings. Normal gallbladder with no bile duct dilation. 2. Echogenic kidneys consistent with medical renal disease. 3. Incomplete visualization of pancreas. 4. Small amount of ascites.   Electronically Signed   By: Amie Portland M.D.   On: 04/02/2013 12:36   Dg Abd Acute W/chest  04/02/2013   CLINICAL DATA:  Pain.  Weakness.  EXAM: ACUTE ABDOMEN SERIES (ABDOMEN 2 VIEW & CHEST 1 VIEW)  COMPARISON:  12/07/2012.  FINDINGS: Mediastinum and hilar structures normal.  Chronic interstitial prominence noted consistent with interstitial fibrosis. Changes suggesting COPD. Stable nodularity suggests the possibility of prior granulomatous disease. No pneumothorax. Cardiomegaly, no CHF.  Surgical clips left upper quadrant. Gas pattern is nonspecific with stool throughout the colon. No free air. Severe thoracolumbar and bilateral hip degenerative change. Surgical clips left lower pelvis. Aortoiliac atherosclerotic vascular disease.  IMPRESSION: 1. No acute abdominal abnormality identified. 2. Chronic interstitial lung disease and COPD.   Electronically Signed   By: Maisie Fus  Register   On: 04/02/2013 11:27     Assessment/Plan Principal Problem:   Abdominal  pain, other specified site Active Problems:   Benign essential HTN   Loss of weight   Unsteady gait   FTT (failure to thrive) in adult   Mild malnutrition   Non Hodgkin's lymphoma   Atrial fibrillation   Goiter   Memory deficit   Chronic airway obstruction, not elsewhere classified Problem # 1:  Abdominal pain (ICD-789.00) (ICD10-R10.9) persistant. Tried to do MRI but couldn't lie down long enough to complete pancreas not seen well on CT, Lipase and amylase pending  CT done 11/17 IMPRESSION: Persistent chronic inflammatory changes involving the lung bases.  No acute abdominal/ pelvic findings, mass lesions or adenopathy.  Apparent wall thickening in the body region of the stomach. Could not exclude a inflammatory or infiltrative process. Upper endoscopy may be helpful for further evaluation   04/02/2013 11:10  Lipase: 46  04/02/2013 11:10 Alkaline Phosphatase: 44 Albumin: 3.2 (L) Lipase: 46 AST: 67 (H) ALT: 22 Total Protein: 7.8 Total Bilirubin: 0.6   Just had endocopy in [place], doubt an infiltrative process MRI partially done, no contrast images possible. but pancreas and stomach look grossly normal Outside possibliity of mesenteric ischemia,  doubt recurrence of her lymphoma without  adenopathy     Orders: Amylase (amylase) LIPASE (lipase) MRI Abdomen Pelvis W/ Contrast (MRIAP)   Problem # 2:  ATRIAL FIBRILLATION (ICD-427.31) (ICD10-I48.0) on Rx with reasonable rate Her updated medication list for this problem includes:    Lanoxin 0.125 Mg Tabs (Digoxin) .Marland Kitchen... Take one tablet by mouth daily    Atenolol 25 Mg Tabs (Atenolol) .Marland Kitchen... 1 tab po qd   Problem # 3:  Anorexia (ICD-783.0) (ICD10-R63.0) will get GI to see while in Orders: MRI Abdomen Pelvis W/ Contrast (MRIAP)   Problem # 4:  NON-HODGKIN'S LYMPHOMA (ICD-202.80) (ICD10-C85.90) note from 2013 Dr Cyndie Chime  She was initially diagnosed with low-grade non-Hodgkin lymphoma involving her spleen in 1988. She underwent splenectomy. She progressed to lymph nodes in 1993. She received intermittent chemotherapy until June 2001 when she had a Richter-type conversion to high-grade lymphoma presenting with diffuse spine pain and hypercalcemia. Disease limited to the bone and bone marrow. She was treated with CHOP/Rituxan and achieved a complete response. She went on to consolidation therapy with Zevalin radioimmunotherapy in January 2002 at [facility]. She has remained in a stable remission now out over 12 years from completion of treatments.    Problem # 5:  SPINAL STENOSIS (ICD-724.00) (ICD10-M48.00) doubt this could be causing the abd pain  Problem # 6:  Memory loss (ICD-780.93) seems to be worsening clearly frail with FTT  Problem # 7:  Malnutrition (ICD-263.9) (ICD10-E46) Results for ARYEL, EDELEN (MRN [id number]) as of 04/09/2013 16:41  12/10/2012 04:35 Albumin: 2.6 (L)  12/11/2012 04:17 Albumin: 2.6 (L)  04/02/2013 11:10 Albumin: 3.2 (L)  Orders: Venipuncture ([id number]) CBC/Platelets ([id number]) TSH ([id number]) Amylase (amylase) Cortisol ([id number]) LIPASE (lipase) MRI Abdomen Pelvis W/ Contrast (MRIAP)   Problem # 8:  ANEMIA (ICD-285.9) (ICD10-D64.9) Results for YAZLYN, WENTZEL (MRN [id number]) as of 04/09/2013 16:41  04/02/2013 11:10 WBC: 8.7 RBC: 4.64 Hemoglobin: 13.5 HCT: 39.0 MCV: 84.1 MCH: 29.1 MCHC: 34.6 RDW: 16.5 (H) Platelets: 279   Problem # 9:  SCHATZKI'S RING (ICD-530.3) (QIH47-Q25.9) not seen on ENDO earlier this yr  Problem # 10:  Weight loss (ICD-783.21) (DGL87-F64.4) CXR 11/17 IMPRESSION: 1. No acute abdominal abnormality identified. 2. Chronic interstitial lung disease and COPD.  bone scan 2012: IMPRESSION:   1.  Uptake within the sacrum is concerning for insufficiency fracture or stress fracture.  Consider noncontrast MRI of the pelvis / sacrum for further evaluation. 2.  Uptake within the right wrist consistent recent distal radius fracture. 3.  Uptake within the left carpal region is incompletely imaged. Differential includes traumatic versus degenerative uptake.  Orders: MRI Abdomen Pelvis W/ Contrast (MRIAP)   Problem # 11:  HYPERTENSION (ICD-401.9) (ICD10-I10) low nl off of Rx need to see AM cortisol but lytes aren't very suggestive  Her updated medication list for this problem includes:    Atenolol 25 Mg Tabs (Atenolol) .Marland Kitchen... 1 tab po qd    Norvasc 5 Mg Tabs (Amlodipine besylate) .Marland Kitchen... Take one tablet by mouth daily   Problem # 12:  GOITER (ICD-240.9) (ICD10-E04.9)  need to see levels again TSH: 0.59 (10/24/2012 2:29:00 PM)     Medications Added to Medication List This Visit: 1)  Prevacid 15 Mg Cpdr (Lansoprazole) .Marland Kitchen.. 1 po qd 2)  Lyrica 100 Mg Caps (Pregabalin) .Marland Kitchen.. 1 cap po every evening  Complete Medication List: 1)  Prevacid 15 Mg Cpdr (Lansoprazole) .Marland Kitchen.. 1 po qd 2)  Vitamin D 1000 Unit Caps (Cholecalciferol) .... Take 1 tablet daily 3)  Donepezil Hcl 10 Mg Tabs (Donepezil hcl) .... Take 1 tablet at bedtime 4)  Lyrica 100 Mg Caps (Pregabalin) .Marland Kitchen.. 1 cap po every evening 5)  Lanoxin 0.125 Mg Tabs (Digoxin) .... Take one tablet by mouth daily 6)  Tylenol Arthritis Pain 650 Mg Cr-tabs (Acetaminophen) ....  Take as need for arthritis pain 7)  Fish Oil 1000 Mg Caps (Omega-3 fatty acids) .... One tablet by mouth once a day 8)  Atenolol 25 Mg Tabs (Atenolol) .Marland Kitchen.. 1 tab po qd 9)  Prenatal Multivit-iron Tabs (Prenatal vit-fe sulfate-fa) .... Take one tablet by mouth daily 10)  Norvasc 5 Mg Tabs (Amlodipine besylate) .Marland KitchenMarland KitchenMarland Kitchen  Take one tablet by mouth daily 11)  Fenofibrate 160 Mg Tabs (Fenofibrate) .... Take one tablet daily 12)  Hydrocodone-acetaminophen 5-325 Mg Tabs (Hydrocodone-acetaminophen) .... Take 1 tablet by mouth every 6 hours as needed for pain 13)  Stool Softener 240 Mg Caps (Docusate calcium) .... 2qd.. verify dose   Amed Datta [name] 04/09/2013, 5:15 PM

## 2013-04-10 ENCOUNTER — Encounter (HOSPITAL_COMMUNITY)
Admission: AD | Disposition: A | Discharge status: Home / Self Care | Payer: Self-pay | Source: Ambulatory Visit | Attending: Endocrinology

## 2013-04-10 ENCOUNTER — Encounter (HOSPITAL_COMMUNITY): Payer: Self-pay | Admitting: *Deleted

## 2013-04-10 DIAGNOSIS — E43 Unspecified severe protein-calorie malnutrition: Secondary | ICD-10-CM | POA: Insufficient documentation

## 2013-04-10 DIAGNOSIS — K2940 Chronic atrophic gastritis without bleeding: Secondary | ICD-10-CM | POA: Diagnosis not present

## 2013-04-10 HISTORY — PX: ESOPHAGOGASTRODUODENOSCOPY: SHX5428

## 2013-04-10 LAB — CBC
Hemoglobin: 11.9 g/dL — ABNORMAL LOW (ref 12.0–15.0)
MCH: 28.8 pg (ref 26.0–34.0)
MCHC: 34.6 g/dL (ref 30.0–36.0)
MCV: 83.3 fL (ref 78.0–100.0)
RDW: 16.7 % — ABNORMAL HIGH (ref 11.5–15.5)

## 2013-04-10 LAB — COMPREHENSIVE METABOLIC PANEL
ALT: 11 U/L (ref 0–35)
Albumin: 2.7 g/dL — ABNORMAL LOW (ref 3.5–5.2)
Alkaline Phosphatase: 41 U/L (ref 39–117)
Calcium: 9.7 mg/dL (ref 8.4–10.5)
Creatinine, Ser: 0.77 mg/dL (ref 0.50–1.10)
GFR calc Af Amer: 84 mL/min — ABNORMAL LOW (ref >90)
Glucose, Bld: 86 mg/dL (ref 70–99)
Potassium: 3.5 mEq/L (ref 3.5–5.1)
Sodium: 134 mEq/L — ABNORMAL LOW (ref 135–145)
Total Protein: 6.6 g/dL (ref 6.0–8.3)

## 2013-04-10 LAB — CORTISOL: Cortisol, Plasma: 11.9 ug/dL

## 2013-04-10 LAB — AMYLASE: Amylase: 67 U/L (ref 0–105)

## 2013-04-10 LAB — SEDIMENTATION RATE: Sed Rate: 15 mm/hr (ref 0–22)

## 2013-04-10 SURGERY — EGD (ESOPHAGOGASTRODUODENOSCOPY)
Anesthesia: Moderate Sedation

## 2013-04-10 MED ORDER — FENTANYL CITRATE 0.05 MG/ML IJ SOLN
INTRAMUSCULAR | Status: DC | PRN
  Administered 2013-04-10: 25 ug via INTRAVENOUS

## 2013-04-10 MED ORDER — MIDAZOLAM HCL 10 MG/2ML IJ SOLN
INTRAMUSCULAR | Status: AC
  Filled 2013-04-10: qty 4

## 2013-04-10 MED ORDER — BUTAMBEN-TETRACAINE-BENZOCAINE 2-2-14 % EX AERO
INHALATION_SPRAY | CUTANEOUS | Status: DC | PRN
  Administered 2013-04-10: 2 via TOPICAL

## 2013-04-10 MED ORDER — DIPHENHYDRAMINE HCL 50 MG/ML IJ SOLN
INTRAMUSCULAR | Status: AC
  Filled 2013-04-10: qty 1

## 2013-04-10 MED ORDER — MIDAZOLAM HCL 10 MG/2ML IJ SOLN
INTRAMUSCULAR | Status: DC | PRN
  Administered 2013-04-10: 2 mg via INTRAVENOUS

## 2013-04-10 MED ORDER — FENTANYL CITRATE 0.05 MG/ML IJ SOLN
INTRAMUSCULAR | Status: AC
  Filled 2013-04-10: qty 2

## 2013-04-10 MED ORDER — SODIUM CHLORIDE 0.9 % IV SOLN
INTRAVENOUS | Status: DC
  Administered 2013-04-10: 500 mL via INTRAVENOUS

## 2013-04-10 NOTE — Interval H&P Note (Signed)
History and Physical Interval Note:  04/10/2013 1:13 PM  [name]  has presented today for surgery, with the diagnosis of abd pain  The various methods of treatment have been discussed with the patient and family. After consideration of risks, benefits and other options for treatment, the patient has consented to  Procedure(s): ESOPHAGOGASTRODUODENOSCOPY (EGD) (N/A) as a surgical intervention .  The patient's history has been reviewed, patient examined, no change in status, stable for surgery.  I have reviewed the patient's chart and labs.  Questions were answered to the patient's satisfaction.     [name],Altamese Deguire L

## 2013-04-10 NOTE — Op Note (Signed)
[facility] [address] [place], [address]   ENDOSCOPY PROCEDURE REPORT  PATIENT: Diana, Rogers  MR#: [id number] BIRTHDATE: Feb 20, 1923 , 89  yrs. old GENDER: Female ENDOSCOPIST:Tyiana Hill Randa Evens, MD REFERRED BY:  Dr. Evlyn Kanner PROCEDURE DATE:  04/10/2013 PROCEDURE:  EGD and biopsy ASA CLASS:    class III INDICATIONS:   abdominal pain early satiety despite the use of Prevacid MEDICATION:    fentanyl 25 mcg, versed 2 mg IV TOPICAL ANESTHETIC:    cetacaine spray  DESCRIPTION OF PROCEDURE:   The procedure had been explained to the patient's daughter and consider obtain. The Pentax upper scope was inserted into the esophagus would deglutination without difficulty. The esophagus was grossly endoscopically normal. The stomach was entered course identified and passed. Duodenum including 2nd duodenum and duodenal bulb were normal. Scope withdrawn back into the stomach and pyloric channel was normal. Antrum was atrophic but otherwise grossly normal. In the mid-body of the stomach was concentric narrowing of marked inflammation and edematous folds are somewhat hard.. These were biopsied extensively and was quite hard. The area proximal to this was grossly endoscopically normal. The scope was withdrawn in the patient tolerated procedure well.     COMPLICATIONS: None  ENDOSCOPIC IMPRESSION: 1. Concentric Narrowing the body of the stomach biopied. Probably resulting in early satiety. Must be concerned that this could be an infiltrated process  RECOMMENDATIONS: 1. We'll continue her on PPI therapy 2. Await the results of the biopsies 3. Resume mechanically soft diet    _______________________________ eSignedCarman Ching, MD 04/10/2013 1:40 PM  CC: Dr Evlyn Kanner  PATIENT NAME:  Diana, Rogers MR#: [id number]

## 2013-04-10 NOTE — Consult Note (Signed)
[facility] CONSULT Reason for consult: abdominal pain and inability to eat Referring Physician: Dr. South  [name] is an 79 y.o. female.  HPI: patient has a history of dementia and non-Hodgkin's low-grade B. cell lymphoma dating back to 1988. She had splenectomy and CHOP chemotherapy at that time. She said colonoscopies with the removal of colon polyps and has had a sigmoid colon ectomy due to diverticulosis and stricture. She also has a history of CVA. She had been on anticoagulation in the past. She was taking this two-day tour fibrillation. She was admitted to the hospital 7/14 with a history of hematemesis and underwent EGD by Dr Ganem showing diffuse gastritis. Her hemoglobin was stable if of anticoagulation and she was treated with Prevacid and discharge for a brief period of time to SNF and gradually improved and was able to return home. She lives alone put her daughter lives close by. For the past months or .so she's been unable to see it has been complaining of upper abdominal pain. The symptom is somewhat variable. There does not appear to be nausea. Discussed with the daughter and the patient has been taking Prevacid ever since her discharge. She hasn't vomited when she eats feels pain and feel early satiety. Due to her continued pain she came to the ER last week. Ultrasound of the gallbladder was normal there was a small amount of ascites. CT scan of the abdomen revealed moderate thickening of the stomach could not be certain if this wasn't inflammatory for infiltrated process. An MRI was attempted but the patient was unable to lie still and was admitted to the hospital. Much of a history is obtained from the patient's daughter by phone  the patient is not been complaining of any problems with her bowels and has had diarrhea only occasionally. The patient her self denies any melena or hematochezia.   Past Medical History  Diagnosis Date  . Cancer   . Lymphoma, high grade  07/23/2011  . Benign essential HTN 07/23/2011  . Hyperlipidemia 07/23/2011  . 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 DJD (degenerative joint disease), lumbar 07/23/2011  . Current non-smoker but past smoking history unknown 12/08/12    Past Surgical History  Procedure Laterality Date  . Colon surgery    . Appendectomy    . Abdominal hysterectomy    . Hernia repair    . Tonsillectomy    . Splenectomy    . Shoulder surgery    . Hemorroidectomy    . Esophagogastroduodenoscopy N/A 12/08/2012    Procedure: ESOPHAGOGASTRODUODENOSCOPY (EGD);  Surgeon: Salem F Ganem, MD;  Location: [facility];  Service: Endoscopy;  Laterality: N/A;    History reviewed. No pertinent family history.  Social History:  reports that she has never smoked. She has never used smokeless tobacco. She reports that she does not drink alcohol or use illicit drugs.  Allergies:  Allergies  Allergen Reactions  . Iodine     Fever , chills  . Penicillins     Unknown   . Shellfish-Derived Products     Unknown     Medications; Prior to Admission medications   Medication Sig Start Date End Date Taking? Authorizing Provider  amLODipine (NORVASC) 5 MG tablet Take 5 mg by mouth daily.  06/02/11   Historical Provider, MD  atenolol (TENORMIN) 25 MG tablet Take 25 mg by mouth daily.  06/02/11   Historical Provider, MD  Cholecalciferol (VITAMIN D PO) Take 1 tablet by mouth daily.    Historical Provider, MD    digoxin (LANOXIN) 0.125 MG tablet Take 125 mcg by mouth daily.  06/10/11   Historical Provider, MD  donepezil (ARICEPT) 10 MG tablet Take 10 mg by mouth at bedtime.    Historical Provider, MD  fenofibrate 160 MG tablet Take 160 mg by mouth.  06/02/11   Historical Provider, MD  fish oil-omega-3 fatty acids 1000 MG capsule Take 1 g by mouth daily.    Historical Provider, MD  lansoprazole (PREVACID) 15 MG capsule Take 15 mg by mouth daily at 12 noon.    Historical Provider, MD  lansoprazole (PREVACID) 15 MG capsule Take 1 po BID  x 2 weeks then once a day 04/02/13   Iva L Knapp, MD  lansoprazole (PREVACID) 15 MG capsule Take 1 po BID x 2 weeks then once a day 04/02/13   Iva L Knapp, MD  LYRICA 100 MG capsule Take 100 mg by mouth at bedtime.  07/09/11   Historical Provider, MD  oxyCODONE-acetaminophen (PERCOCET/ROXICET) 5-325 MG per tablet Take 1 tablet by mouth every 6 (six) hours as needed for pain.    Historical Provider, MD  Prenatal Vit-Fe Fumarate-FA (MULTIVITAMIN-PRENATAL) 27-0.8 MG TABS Take 1 tablet by mouth daily.    Historical Provider, MD   . digoxin  125 mcg Oral Daily  . enoxaparin (LOVENOX) injection  30 mg Subcutaneous Q24H  . multivitamin-prenatal  1 tablet Oral Daily  . pantoprazole  40 mg Oral Daily  . pregabalin  100 mg Oral QHS   PRN Meds oxyCODONE-acetaminophen Results for orders placed during the hospital encounter of 04/09/13 (from the past 48 hour(s))  CBC     Status: Abnormal   Collection Time    04/10/13  4:50 AM      Result Value Range   WBC 7.5  4.0 - 10.5 K/uL   RBC 4.13  3.87 - 5.11 MIL/uL   Hemoglobin 11.9 (*) 12.0 - 15.0 g/dL   HCT 34.4 (*) 36.0 - 46.0 %   MCV 83.3  78.0 - 100.0 fL   MCH 28.8  26.0 - 34.0 pg   MCHC 34.6  30.0 - 36.0 g/dL   RDW 16.7 (*) 11.5 - 15.5 %   Platelets 304  150 - 400 K/uL  COMPREHENSIVE METABOLIC PANEL     Status: Abnormal   Collection Time    04/10/13  4:50 AM      Result Value Range   Sodium 134 (*) 135 - 145 mEq/L   Potassium 3.5  3.5 - 5.1 mEq/L   Chloride 99  96 - 112 mEq/L   CO2 28  19 - 32 mEq/L   Glucose, Bld 86  70 - 99 mg/dL   BUN 23  6 - 23 mg/dL   Creatinine, Ser 0.77  0.50 - 1.10 mg/dL   Calcium 9.7  8.4 - 10.5 mg/dL   Total Protein 6.6  6.0 - 8.3 g/dL   Albumin 2.7 (*) 3.5 - 5.2 g/dL   AST 25  0 - 37 U/L   ALT 11  0 - 35 U/L   Alkaline Phosphatase 41  39 - 117 U/L   Total Bilirubin 0.7  0.3 - 1.2 mg/dL   GFR calc non Af Amer 73 (*) >90 mL/min   GFR calc Af Amer 84 (*) >90 mL/min   Comment: (NOTE)     The eGFR has been  calculated using the CKD EPI equation.     This calculation has not been validated in all clinical situations.     eGFR's persistently <  90 mL/min signify possible Chronic Kidney     Disease.  LIPASE, BLOOD     Status: None   Collection Time    04/10/13  4:50 AM      Result Value Range   Lipase 35  11 - 59 U/L  AMYLASE     Status: None   Collection Time    04/10/13  4:50 AM      Result Value Range   Amylase 67  0 - 105 U/L  SEDIMENTATION RATE     Status: None   Collection Time    04/10/13  4:50 AM      Result Value Range   Sed Rate 15  0 - 22 mm/hr    No results found.             Blood pressure 148/83, pulse 89, temperature 98.2 F (36.8 C), temperature source Oral, resp. rate 18, height 5' 4" (1.626 m), weight 35.381 kg (78 lb), SpO2 91.00%.  Physical exam:   General-- frail talkative white female who appears confused  Heart-- regular rate and rhythm without murmurs are gallops Lungs--clear  Abdomen--slight distention with upper abdominal tenderness. Questionable small periumbilical lymph nodes   Assessment: 1. Upper abdominal pain/early satiety. Patient clearly cannot he and his loss weight in spite of PPI therapy on chronic basis. She did have more gastritis is past summer and now still as thickened gastricon  CT scan. I think that EGD would be the appropriate diagnostic test at this time. We'll likely obtain gastric biopsies. I have discussed this in detail the patient's daughter.    Plan: 1.We will plan on EGD later today we can get her on schedule and plan on obtaining gastric biopsies. We'll continue be protonix for now. 2. We'll also order stool Hemoccult.    Clennon Nasca JR,Vaidehi Braddy L 04/10/2013, 7:45 AM      

## 2013-04-10 NOTE — Progress Notes (Signed)
INITIAL NUTRITION ASSESSMENT  Pt meets criteria for severe MALNUTRITION in the context of chronic illness as evidenced by <75% estimated energy intake in the past month with severe muscle wasting and subcutaneous fat loss throughout body.  DOCUMENTATION CODES Per approved criteria  -Severe malnutrition in the context of chronic illness -Underweight   INTERVENTION: - Diet advancement per MD - Recommend Boost Plus BID when diet advanced - Will continue to monitor   NUTRITION DIAGNOSIS: Inadequate oral intke related to inability to eat as evidenced by NPO.   Goal: Advance diet as tolerated to regular diet  Monitor:  Weights, labs, diet advancement  Reason for Assessment: MST  79 y.o. female  Admitting Dx: Abdominal  pain, other specified site  ASSESSMENT: Pt with dementia. Stated she was eating good at home, 3 small meals/day. Per H&P, daughter reports pt is barely eating anything at all for the past week r/t abdominal discomfort. Reported pt sips on Boost but not enough to sustain her weight. Pt weighs 78 pounds. Per weight trend, pt's weight down 8 pounds in the past 4 months. GI following. Had EGD today.   Nutrition Focused Physical Exam:  Subcutaneous Fat:  Orbital Region: severe wasting Upper Arm Region: severe wasting Thoracic and Lumbar Region: NA  Muscle:  Temple Region: severe wasting Clavicle Bone Region: severe wasting Clavicle and Acromion Bone Region: severe wasting Scapular Bone Region: NA Dorsal Hand: severe wasting Patellar Region: severe wasting Anterior Thigh Region: severe wasting Posterior Calf Region: severe wasting  Edema: None noted    Height: Ht Readings from Last 1 Encounters:  04/09/13 5\' 4"  (1.626 m)    Weight: Wt Readings from Last 1 Encounters:  04/09/13 78 lb (35.381 kg)    Ideal Body Weight: 120 lb   % Ideal Body Weight: 65%  Wt Readings from Last 10 Encounters:  04/09/13 78 lb (35.381 kg)  04/09/13 78 lb (35.381 kg)   12/11/12 86 lb 12.8 oz (39.372 kg)  12/11/12 86 lb 12.8 oz (39.372 kg)  10/04/12 92 lb 4.8 oz (41.867 kg)  01/24/12 94 lb 4.8 oz (42.774 kg)    Usual Body Weight: 92 lb in May 2014  % Usual Body Weight: 85%  BMI:  Body mass index is 13.38 kg/(m^2). underweight  Estimated Nutritional Needs: Kcal: 1400-1600 Protein: 65-80g Fluid: 1.4-1.6L/day  Skin: Left forearm skin tear   Diet Order: NPO  EDUCATION NEEDS: -No education needs identified at this time  No intake or output data in the 24 hours ending 04/10/13 1311  Last BM: PTA  Labs:   Recent Labs Lab 04/10/13 0450  NA 134*  K 3.5  CL 99  CO2 28  BUN 23  CREATININE 0.77  CALCIUM 9.7  GLUCOSE 86    CBG (last 3)  No results found for this basename: GLUCAP,  in the last 72 hours  Scheduled Meds: . digoxin  125 mcg Oral Daily  . enoxaparin (LOVENOX) injection  30 mg Subcutaneous Q24H  . multivitamin-prenatal  1 tablet Oral Daily  . pantoprazole  40 mg Oral Daily  . pregabalin  100 mg Oral QHS    Continuous Infusions: . sodium chloride 100 mL/hr at 04/09/13 1857  . sodium chloride 500 mL (04/10/13 1250)    Past Medical History  Diagnosis Date  . Cancer   . Lymphoma, high grade 07/23/2011  . Benign essential HTN 07/23/2011  . Hyperlipidemia 07/23/2011  . 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 DJD (degenerative joint disease), lumbar 07/23/2011  . Current non-smoker but  past smoking history unknown 12/08/12    Past Surgical History  Procedure Laterality Date  . Colon surgery    . Appendectomy    . Abdominal hysterectomy    . Hernia repair    . Tonsillectomy    . Splenectomy    . Shoulder surgery    . Hemorroidectomy    . Esophagogastroduodenoscopy N/A 12/08/2012    Procedure: ESOPHAGOGASTRODUODENOSCOPY (EGD);  Surgeon: Graylin Shiver, MD;  Location: [facility];  Service: Endoscopy;  Laterality: N/A;    Diana Hedger MS, RD, LDN [phone] Pager [phone] After Hours Pager

## 2013-04-10 NOTE — Progress Notes (Signed)
Subjective: Had a good night. No abd pain. Not hungry. Breathing OK  Objective: Vital signs in last 24 hours: Temp:  [97.9 F (36.6 C)-98.5 F (36.9 C)] 98.2 F (36.8 C) (11/25 0600) Pulse Rate:  [85-89] 89 (11/25 0600) Resp:  [18-22] 18 (11/25 0600) BP: (138-149)/(78-89) 148/83 mmHg (11/25 0600) SpO2:  [91 %-96 %] 91 % (11/25 0600) Weight:  [35.381 kg (78 lb)] 35.381 kg (78 lb) (11/24 1825)  Intake/Output from previous day:   Intake/Output this shift:    Lying flat in no distress. Oral membranes moist. Lungs clear ht regular in SR abd soft NT extrems thin. Awake. Clear speech, a bit confused  Lab Results   Recent Labs  04/10/13 0450  WBC 7.5  RBC 4.13  HGB 11.9*  HCT 34.4*  MCV 83.3  MCH 28.8  RDW 16.7*  PLT 304    Recent Labs  04/10/13 0450  NA 134*  K 3.5  CL 99  CO2 28  GLUCOSE 86  BUN 23  CREATININE 0.77  CALCIUM 9.7    Studies/Results: No results found.  Scheduled Meds: . digoxin  125 mcg Oral Daily  . enoxaparin (LOVENOX) injection  30 mg Subcutaneous Q24H  . multivitamin-prenatal  1 tablet Oral Daily  . pantoprazole  40 mg Oral Daily  . pregabalin  100 mg Oral QHS   Continuous Infusions: . sodium chloride 100 mL/hr at 04/09/13 1857  . sodium chloride     PRN Meds:oxyCODONE-acetaminophen  Assessment/Plan:  Abdominal pain, other specified site : to have EGD today   Benign essential HTN : BP OK off of meds Loss of weight  Unsteady gait  FTT (failure to thrive) in adult  Mild malnutrition  Non Hodgkin's lymphoma : ?cause of issues Atrial fibrillation in SR Goiter : TSH was fine Memory deficit : mild Chronic airway obstruction, not elsewhere classified Hyponatremia: mild    LOS: 1 day   [name] [name] 04/10/2013, 8:40 AM

## 2013-04-10 NOTE — Progress Notes (Signed)
UR completed 

## 2013-04-10 NOTE — H&P (View-Only) (Signed)
[facility] CONSULT Reason for consult: abdominal pain and inability to eat Referring Physician: Dr. [name] is an 79 y.o. female.  HPI: patient has a history of dementia and non-Hodgkin's low-grade B. cell lymphoma dating back to 39. She had splenectomy and CHOP chemotherapy at that time. She said colonoscopies with the removal of colon polyps and has had a sigmoid colon ectomy due to diverticulosis and stricture. She also has a history of CVA. She had been on anticoagulation in the past. She was taking this two-day tour fibrillation. She was admitted to the hospital 7/14 with a history of hematemesis and underwent EGD by Dr [name] showing diffuse gastritis. Her hemoglobin was stable if of anticoagulation and she was treated with Prevacid and discharge for a brief period of time to SNF and gradually improved and was able to return home. She lives alone put her daughter lives close by. For the past months or .so she's been unable to see it has been complaining of upper abdominal pain. The symptom is somewhat variable. There does not appear to be nausea. Discussed with the daughter and the patient has been taking Prevacid ever since her discharge. She hasn't vomited when she eats feels pain and feel early satiety. Due to her continued pain she came to the ER last week. Ultrasound of the gallbladder was normal there was a small amount of ascites. CT scan of the abdomen revealed moderate thickening of the stomach could not be certain if this wasn't inflammatory for infiltrated process. An MRI was attempted but the patient was unable to lie still and was admitted to the hospital. Much of a history is obtained from the patient's daughter by phone  the patient is not been complaining of any problems with her bowels and has had diarrhea only occasionally. The patient her self denies any melena or hematochezia.   Past Medical History  Diagnosis Date  . Cancer   . Lymphoma, high grade  07/23/2011  . Benign essential HTN 07/23/2011  . Hyperlipidemia 07/23/2011  . 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 DJD (degenerative joint disease), lumbar 07/23/2011  . Current non-smoker but past smoking history unknown 12/08/12    Past Surgical History  Procedure Laterality Date  . Colon surgery    . Appendectomy    . Abdominal hysterectomy    . Hernia repair    . Tonsillectomy    . Splenectomy    . Shoulder surgery    . Hemorroidectomy    . Esophagogastroduodenoscopy N/A 12/08/2012    Procedure: ESOPHAGOGASTRODUODENOSCOPY (EGD);  Surgeon: Diana Shiver, MD;  Location: [facility];  Service: Endoscopy;  Laterality: N/A;    History reviewed. No pertinent family history.  Social History:  reports that she has never smoked. She has never used smokeless tobacco. She reports that she does not drink alcohol or use illicit drugs.  Allergies:  Allergies  Allergen Reactions  . Iodine     Fever , chills  . Penicillins     Unknown   . Shellfish-Derived Products     Unknown     Medications; Prior to Admission medications   Medication Sig Start Date End Date Taking? Authorizing Provider  amLODipine (NORVASC) 5 MG tablet Take 5 mg by mouth daily.  06/02/11   Historical Provider, MD  atenolol (TENORMIN) 25 MG tablet Take 25 mg by mouth daily.  06/02/11   Historical Provider, MD  Cholecalciferol (VITAMIN D PO) Take 1 tablet by mouth daily.    Historical Provider, MD  digoxin (LANOXIN) 0.125 MG tablet Take 125 mcg by mouth daily.  06/10/11   Historical Provider, MD  donepezil (ARICEPT) 10 MG tablet Take 10 mg by mouth at bedtime.    Historical Provider, MD  fenofibrate 160 MG tablet Take 160 mg by mouth.  06/02/11   Historical Provider, MD  fish oil-omega-3 fatty acids 1000 MG capsule Take 1 g by mouth daily.    Historical Provider, MD  lansoprazole (PREVACID) 15 MG capsule Take 15 mg by mouth daily at 12 noon.    Historical Provider, MD  lansoprazole (PREVACID) 15 MG capsule Take 1 po BID  x 2 weeks then once a day 04/02/13   Diana Givens, MD  lansoprazole (PREVACID) 15 MG capsule Take 1 po BID x 2 weeks then once a day 04/02/13   Diana Givens, MD  LYRICA 100 MG capsule Take 100 mg by mouth at bedtime.  07/09/11   Historical Provider, MD  oxyCODONE-acetaminophen (PERCOCET/ROXICET) 5-325 MG per tablet Take 1 tablet by mouth every 6 (six) hours as needed for pain.    Historical Provider, MD  Prenatal Vit-Fe Fumarate-FA (MULTIVITAMIN-PRENATAL) 27-0.8 MG TABS Take 1 tablet by mouth daily.    Historical Provider, MD   . digoxin  125 mcg Oral Daily  . enoxaparin (LOVENOX) injection  30 mg Subcutaneous Q24H  . multivitamin-prenatal  1 tablet Oral Daily  . pantoprazole  40 mg Oral Daily  . pregabalin  100 mg Oral QHS   PRN Meds oxyCODONE-acetaminophen Results for orders placed during the hospital encounter of 04/09/13 (from the past 48 hour(s))  CBC     Status: Abnormal   Collection Time    04/10/13  4:50 AM      Result Value Range   WBC 7.5  4.0 - 10.5 K/uL   RBC 4.13  3.87 - 5.11 MIL/uL   Hemoglobin 11.9 (*) 12.0 - 15.0 g/dL   HCT 40.9 (*) 81.1 - 91.4 %   MCV 83.3  78.0 - 100.0 fL   MCH 28.8  26.0 - 34.0 pg   MCHC 34.6  30.0 - 36.0 g/dL   RDW 78.2 (*) 95.6 - 21.3 %   Platelets 304  150 - 400 K/uL  COMPREHENSIVE METABOLIC PANEL     Status: Abnormal   Collection Time    04/10/13  4:50 AM      Result Value Range   Sodium 134 (*) 135 - 145 mEq/Rogers   Potassium 3.5  3.5 - 5.1 mEq/Rogers   Chloride 99  96 - 112 mEq/Rogers   CO2 28  19 - 32 mEq/Rogers   Glucose, Bld 86  70 - 99 mg/dL   BUN 23  6 - 23 mg/dL   Creatinine, Ser 0.86  0.50 - 1.10 mg/dL   Calcium 9.7  8.4 - 57.8 mg/dL   Total Protein 6.6  6.0 - 8.3 g/dL   Albumin 2.7 (*) 3.5 - 5.2 g/dL   AST 25  0 - 37 U/Rogers   ALT 11  0 - 35 U/Rogers   Alkaline Phosphatase 41  39 - 117 U/Rogers   Total Bilirubin 0.7  0.3 - 1.2 mg/dL   GFR calc non Af Amer 73 (*) >90 mL/min   GFR calc Af Amer 84 (*) >90 mL/min   Comment: (NOTE)     The eGFR has been  calculated using the CKD EPI equation.     This calculation has not been validated in all clinical situations.     eGFR's persistently <  90 mL/min signify possible Chronic Kidney     Disease.  LIPASE, BLOOD     Status: None   Collection Time    04/10/13  4:50 AM      Result Value Range   Lipase 35  11 - 59 U/Rogers  AMYLASE     Status: None   Collection Time    04/10/13  4:50 AM      Result Value Range   Amylase 67  0 - 105 U/Rogers  SEDIMENTATION RATE     Status: None   Collection Time    04/10/13  4:50 AM      Result Value Range   Sed Rate 15  0 - 22 mm/hr    No results found.             Blood pressure 148/83, pulse 89, temperature 98.2 F (36.8 C), temperature source Oral, resp. rate 18, height 5\' 4"  (1.626 m), weight 35.381 kg (78 lb), SpO2 91.00%.  Physical exam:   General-- frail talkative white female who appears confused  Heart-- regular rate and rhythm without murmurs are gallops Lungs--clear  Abdomen--slight distention with upper abdominal tenderness. Questionable small periumbilical lymph nodes   Assessment: 1. Upper abdominal pain/early satiety. Patient clearly cannot he and his loss weight in spite of PPI therapy on chronic basis. She did have more gastritis is past summer and now still as thickened gastricon  CT scan. I think that EGD would be the appropriate diagnostic test at this time. We'll likely obtain gastric biopsies. I have discussed this in detail the patient's daughter.    Plan: 1.We will plan on EGD later today we can get her on schedule and plan on obtaining gastric biopsies. We'll continue be protonix for now. 2. We'll also order stool Hemoccult.    [name],[name] 04/10/2013, 7:45 AM

## 2013-04-11 ENCOUNTER — Encounter (HOSPITAL_COMMUNITY): Payer: Self-pay | Admitting: Gastroenterology

## 2013-04-11 DIAGNOSIS — K2940 Chronic atrophic gastritis without bleeding: Secondary | ICD-10-CM | POA: Diagnosis not present

## 2013-04-11 LAB — OCCULT BLOOD X 1 CARD TO LAB, STOOL: Fecal Occult Bld: NEGATIVE

## 2013-04-11 NOTE — Clinical Social Work Psychosocial (Signed)
     Clinical Social Work Department BRIEF PSYCHOSOCIAL ASSESSMENT 04/11/2013  Patient:  [name], [name]     Account Number:  [id number]     Admit date:  04/09/2013  Clinical Social Worker:  Hattie Perch  Date/Time:  04/11/2013 12:00 M  Referred by:  Physician  Date Referred:  04/11/2013 Referred for  SNF Placement   Other Referral:   Interview type:  Patient Other interview type:    PSYCHOSOCIAL DATA Living Status:  ALONE Admitted from facility:   Level of care:   Primary support name:  Quintella Baton Primary support relationship to patient:  CHILD, ADULT Degree of support available:   good    CURRENT CONCERNS Current Concerns  Post-Acute Placement   Other Concerns:    SOCIAL WORK ASSESSMENT / PLAN CSW met with patient. patient is alert and oriented X3. patient is very pleasant and cooperative. discussed possible need for snf placement. patient states that she lives on family land and her daughter only lives a few feet from her and her daughter is a Engineer, civil (consulting). she states that she feels pretty good and thinks that she will be able to go home by herself. will await the results of physical therapy, however, nurse and nurse tech both state that patient has been able to walk to the bathroom herself.   Assessment/plan status:   Other assessment/ plan:   Information/referral to community resources:    PATIENTS/FAMILYS RESPONSE TO PLAN OF CARE: patient feels good about her prognosis and feels she will be able to care for herself at home.

## 2013-04-11 NOTE — Evaluation (Addendum)
Physical Therapy Evaluation Patient Details Name: [name] MRN: [id number] DOB: [birth year] Today's Date: 04/11/2013 Time: 5621-3086 PT Time Calculation (min): 22 min  PT Assessment / Plan / Recommendation History of Present Illness  79 y.o. female with h/o lymphoma, a fib, CVA admitted with abdominal pain  Clinical Impression  *Pt admitted with abdominal pain*. Pt currently with functional limitations due to the deficits listed below (see PT Problem List).  Pt will benefit from skilled PT to increase their independence and safety with mobility to allow discharge to the venue listed below.   **    PT Assessment  Patient needs continued PT services    Follow Up Recommendations  Home health PT    Does the patient have the potential to tolerate intense rehabilitation      Barriers to Discharge        Equipment Recommendations  Rolling walker with 5" wheels    Recommendations for Other Services     Frequency Min 3X/week    Precautions / Restrictions Precautions Precautions: None Restrictions Weight Bearing Restrictions: No   Pertinent Vitals/Pain *no c/o pain**      Mobility  Bed Mobility Bed Mobility: Not assessed Transfers Transfers: Stand to Sit Stand to Sit: 6: Modified independent (Device/Increase time);To chair/3-in-1 Ambulation/Gait Ambulation/Gait Assistance: 5: Supervision Ambulation Distance (Feet): 400 Feet Assistive device: Rolling walker Gait Pattern: Within Functional Limits Gait velocity: WNL General Gait Details: Steady with RW, pt reports feeling more support with use of RW, didn't use assistive device at home    Exercises     PT Diagnosis: Generalized weakness  PT Problem List: Decreased knowledge of use of DME;Decreased mobility PT Treatment Interventions: DME instruction;Stair training;Gait training;Therapeutic exercise     PT Goals(Current goals can be found in the care plan section) Acute Rehab PT Goals Patient Stated Goal:  to gain weight, to get strength back PT Goal Formulation: With patient Time For Goal Achievement: 04/25/13 Potential to Achieve Goals: Good  Visit Information  Last PT Received On: 04/11/13 Assistance Needed: +1 History of Present Illness: 79 y.o. female with h/o lymphoma, a fib, CVA admitted with abdominal pain       Prior Functioning  Home Living Family/patient expects to be discharged to:: Private residence Living Arrangements: Alone Available Help at Discharge: Family;Available PRN/intermittently Type of Home: House Home Access: Stairs to enter Home Layout: One level Home Equipment: None Prior Function Level of Independence: Needs assistance Gait / Transfers Assistance Needed: none ADL's / Homemaking Assistance Needed: assist with heavier cleaning, has cleaning lady who comes every two weeks.   Comments: daughter cooks meals and puts them in the refridgerator.   Communication Communication: HOH    Cognition  Cognition Arousal/Alertness: Awake/alert Behavior During Therapy: WFL for tasks assessed/performed Overall Cognitive Status: Within Functional Limits for tasks assessed    Extremity/Trunk Assessment Upper Extremity Assessment Upper Extremity Assessment: Overall WFL for tasks assessed Lower Extremity Assessment Lower Extremity Assessment: Overall WFL for tasks assessed Cervical / Trunk Assessment Cervical / Trunk Assessment: Kyphotic   Balance Balance Balance Assessed: Yes Static Standing Balance Static Standing - Balance Support: No upper extremity supported;During functional activity Static Standing - Level of Assistance: 5: Stand by assistance Static Standing - Comment/# of Minutes: 2 minutes -pt was washing and drying hands in bathroom, SBA 2* pt stated she felt like her legs could give out, no LOB  End of Session PT - End of Session Equipment Utilized During Treatment: Gait belt Activity Tolerance: Patient tolerated treatment well Patient  left: in  chair;with call bell/phone within reach Nurse Communication: Mobility status  PT G-Codes **NOT FOR INPATIENT CLASS** Functional Assessment Tool Used clinical judgement clinical judgement at 1137 on 04/11/13 by Alvester Morin, PT Functional Limitation Mobility: Walking and moving around Mobility: Walking and moving around at 1137 on 04/11/13 by Alvester Morin, PT Mobility: Walking and Moving Around Current Status [phone]) At least 1 percent but less than 20 percent impaired, limited or restricted CI at 1137 on 04/11/13 by Alvester Morin, PT Mobility: Walking and Moving Around Goal Status [phone]) 0 percent impaired, limited or restricted CH at 1137 on 04/11/13 by Alvester Morin, PT Mobility: Walking and Moving Around Discharge Status [phone])    GP     Tamala Ser 04/11/2013, 11:38 AM [phone]

## 2013-04-11 NOTE — Progress Notes (Signed)
[facility] PROGRESS NOTE Subjective patient reports that she had a very good night and tolerated full liquid well. No abdominal pain for nausea with the full liquid  Objective: Vital signs in last 24 hours: Temp:  [97.4 F (36.3 C)-98.3 F (36.8 C)] 98.2 F (36.8 C) (11/26 0627) Pulse Rate:  [58-94] 58 (11/26 0627) Resp:  [12-18] 16 (11/26 0627) BP: (122-170)/(64-93) 123/64 mmHg (11/26 0627) SpO2:  [90 %-98 %] 92 % (11/26 0627) Last BM Date:  (pta)  Intake/Output from previous day: 11/25 0701 - 11/26 0700 In: 4400 [P.O.:270; I.V.:4130] Out: -  Intake/Output this shift:    PE: General-- no acute distress Heart-- Lungs-- Abdomen-- none distended and soft and reasonably nontender  Lab Results:  Recent Labs  04/10/13 0450  WBC 7.5  HGB 11.9*  HCT 34.4*  PLT 304   BMET  Recent Labs  04/10/13 0450  NA 134*  K 3.5  CL 99  CO2 28  CREATININE 0.77   LFT  Recent Labs  04/10/13 0450  PROT 6.6  AST 25  ALT 11  ALKPHOS 41  BILITOT 0.7   PT/INR No results found for this basename: LABPROT, INR,  in the last 72 hours PANCREAS  Recent Labs  04/10/13 0450  LIPASE 35         Studies/Results: No results found.  Medications: I have reviewed the patient's current medications.  Assessment/Plan: 1. Early satiety with concentric thickening in the mid-body of the stomach. Path pending. We'll go ahead and try mechanically soft diet and see how she does with that   Melaya Hoselton JR,Shatisha Falter L 04/11/2013, 9:40 AM

## 2013-04-11 NOTE — Progress Notes (Signed)
Subjective: Had a good night. No abd pain. No breathing trouble   Objective: Vital signs in last 24 hours: Temp:  [97.4 F (36.3 C)-98.3 F (36.8 C)] 98.2 F (36.8 C) (11/26 0627) Pulse Rate:  [58-94] 58 (11/26 0627) Resp:  [12-18] 16 (11/26 0627) BP: (122-170)/(64-93) 123/64 mmHg (11/26 0627) SpO2:  [90 %-98 %] 92 % (11/26 0627)  Intake/Output from previous day: 11/25 0701 - 11/26 0700 In: 4061.7 [P.O.:30; I.V.:4031.7] Out: -  Intake/Output this shift:    Lying flat in no distress.oral membranes moist, neck supple lungs clear no wheeze, ht regular  abd sofT NT, awake, less confusion  Lab Results   Recent Labs  04/10/13 0450  WBC 7.5  RBC 4.13  HGB 11.9*  HCT 34.4*  MCV 83.3  MCH 28.8  RDW 16.7*  PLT 304    Recent Labs  04/10/13 0450  NA 134*  K 3.5  CL 99  CO2 28  GLUCOSE 86  BUN 23  CREATININE 0.77  CALCIUM 9.7    Studies/Results: No results found.  Scheduled Meds: . digoxin  125 mcg Oral Daily  . enoxaparin (LOVENOX) injection  30 mg Subcutaneous Q24H  . multivitamin-prenatal  1 tablet Oral Daily  . pantoprazole  40 mg Oral Daily  . pregabalin  100 mg Oral QHS   Continuous Infusions: . sodium chloride 100 mL/hr at 04/11/13 0601   PRN Meds:oxyCODONE-acetaminophen  Assessment/Plan:  Abdominal pain, EGD suggests infiltrative problem, await bx  Benign essential HTN : BP still OK off of meds  Loss of weight : low body wt Unsteady gait get PT to help FTT (failure to thrive) in adult : may need return to SNF Mild malnutrition  Non Hodgkin's lymphoma : ?cause of issues  Atrial fibrillation in SR  Goiter : TSH was fine  Memory deficit : mild  Chronic airway obstruction, not elsewhere classified  Hyponatremia: mild    LOS: 2 days   Axcel Horsch [name] 04/11/2013, 7:03 AM

## 2013-04-12 DIAGNOSIS — K2940 Chronic atrophic gastritis without bleeding: Secondary | ICD-10-CM | POA: Diagnosis not present

## 2013-04-12 LAB — COMPREHENSIVE METABOLIC PANEL
ALT: 11 U/L (ref 0–35)
Albumin: 2.5 g/dL — ABNORMAL LOW (ref 3.5–5.2)
CO2: 28 mEq/L (ref 19–32)
Calcium: 9 mg/dL (ref 8.4–10.5)
GFR calc Af Amer: >90 mL/min (ref >90)
GFR calc non Af Amer: 78 mL/min — ABNORMAL LOW (ref >90)
Glucose, Bld: 78 mg/dL (ref 70–99)
Sodium: 133 mEq/L — ABNORMAL LOW (ref 135–145)
Total Protein: 6.1 g/dL (ref 6.0–8.3)

## 2013-04-12 LAB — CBC
HCT: 31.9 % — ABNORMAL LOW (ref 36.0–46.0)
Hemoglobin: 11 g/dL — ABNORMAL LOW (ref 12.0–15.0)
MCHC: 34.5 g/dL (ref 30.0–36.0)
RBC: 3.8 MIL/uL — ABNORMAL LOW (ref 3.87–5.11)

## 2013-04-12 MED ORDER — CLARITHROMYCIN 500 MG PO TABS: 500.0000 mg | ORAL_TABLET | Freq: Two times a day (BID) | ORAL | Status: DC

## 2013-04-12 MED ORDER — CLARITHROMYCIN 250 MG PO TABS
250.0000 mg | ORAL_TABLET | Freq: Two times a day (BID) | ORAL | Status: DC
  Administered 2013-04-12 – 2013-04-13 (×3): 250 mg via ORAL
  Filled 2013-04-12 (×4): qty 1

## 2013-04-12 MED ORDER — METRONIDAZOLE 500 MG PO TABS
500.0000 mg | ORAL_TABLET | Freq: Two times a day (BID) | ORAL | Status: DC
  Administered 2013-04-12 – 2013-04-13 (×3): 500 mg via ORAL
  Filled 2013-04-12 (×4): qty 1

## 2013-04-12 MED ORDER — PANTOPRAZOLE SODIUM 40 MG PO TBEC
40.0000 mg | DELAYED_RELEASE_TABLET | Freq: Two times a day (BID) | ORAL | Status: DC
  Administered 2013-04-12 – 2013-04-13 (×3): 40 mg via ORAL
  Filled 2013-04-12 (×3): qty 1

## 2013-04-12 NOTE — Progress Notes (Signed)
Subjective: Had a good night- feeling better. No abd pain. No breathing trouble  Path shows severe H pylori infection Seen by Dr Randa Evens already today. She is sitting in chair besides bed. We discussed D/C planning and strength. She is not ready for D/c yet   Objective: Vital signs in last 24 hours: Temp:  [97.7 F (36.5 C)-98.4 F (36.9 C)] 98.4 F (36.9 C) (11/27 0529) Pulse Rate:  [79-85] 79 (11/27 0529) Resp:  [16-18] 16 (11/27 0529) BP: (100-160)/(82-90) 148/89 mmHg (11/27 0529) SpO2:  [93 %-94 %] 93 % (11/27 0529)  Intake/Output from previous day: 11/26 0701 - 11/27 0700 In: 1060 [P.O.:360; I.V.:700] Out: 1300 [Urine:1300] Intake/Output this shift: Total I/O In: 1850 [I.V.:1850] Out: 800 [Urine:800]  PE  Gen - A and O - looks better than expected Oral membranes moist,  neck supple  lungs clear no wheeze,  ht regular   abd sofy NT   Lab Results   Recent Labs  04/10/13 0450 04/12/13 0438  WBC 7.5 7.2  RBC 4.13 3.80*  HGB 11.9* 11.0*  HCT 34.4* 31.9*  MCV 83.3 83.9  MCH 28.8 28.9  RDW 16.7* 16.3*  PLT 304 306    Recent Labs  04/10/13 0450 04/12/13 0438  NA 134* 133*  K 3.5 3.2*  CL 99 99  CO2 28 28  GLUCOSE 86 78  BUN 23 6  CREATININE 0.77 0.63  CALCIUM 9.7 9.0    Studies/Results: No results found.  Scheduled Meds: . clarithromycin  250 mg Oral Q12H  . digoxin  125 mcg Oral Daily  . enoxaparin (LOVENOX) injection  30 mg Subcutaneous Q24H  . metroNIDAZOLE  500 mg Oral BID  . multivitamin-prenatal  1 tablet Oral Daily  . pantoprazole  40 mg Oral BID  . pregabalin  100 mg Oral QHS   Continuous Infusions: . sodium chloride 100 mL/hr at 04/11/13 1635   PRN Meds:oxyCODONE-acetaminophen  Assessment/Plan: Abdominal pain, EGD suggested infiltrative problem - Bx confirmed Severe H Pylori infection - H pylori gastritis. Will begin tx with clarithromycin, metronidazole, protonix for 2 weeks.  See Dr Randa Evens note.  Benign essential HTN :  BP still OK off of meds  Loss of weight c Mild malnutrition : low body wt Unsteady gait/Deconditioning and AFTT - Seen by PT and they recommended Home health PT, Rolling walker with 5" wheels .  Dr Evlyn Kanner suggested that she may need return to SNF but pt patient feels good about her prognosis and feels she will be able to care for herself at home.  Atrial fibrillation Maintaining SR. Check Dig level tom am. Goiter : TSH was fine  Memory deficit : mild  Chronic airway obstruction, not elsewhere classified  Hyponatremia: mild  Disposition - Anticipate D/C tomorrow? Check labs in am. I ordered HHPT/OT I explained to pt that if she remains too weak to care for self at home SNF is an option.  She stated her RN Daughter is willing to help out.    LOS: 3 days   [name] M 04/12/2013, 9:44 AM

## 2013-04-12 NOTE — Progress Notes (Signed)
[facility] PROGRESS NOTE Subjective Pt states feels better. Path shows severe H pylori infection. Pt daughter confirms PCN allergy although pt denies  Objective: Vital signs in last 24 hours: Temp:  [97.7 F (36.5 C)-98.4 F (36.9 C)] 98.4 F (36.9 C) (11/27 0529) Pulse Rate:  [79-85] 79 (11/27 0529) Resp:  [16-18] 16 (11/27 0529) BP: (100-160)/(82-90) 148/89 mmHg (11/27 0529) SpO2:  [93 %-94 %] 93 % (11/27 0529) Last BM Date:  (pta)  Intake/Output from previous day: 11/26 0701 - 11/27 0700 In: 1060 [P.O.:360; I.V.:700] Out: 1300 [Urine:1300] Intake/Output this shift:    PE: General--pleasantly confused Heart-- Lungs-- Abdomen--soft and nontender  Lab Results:  Recent Labs  04/10/13 0450 04/12/13 0438  WBC 7.5 7.2  HGB 11.9* 11.0*  HCT 34.4* 31.9*  PLT 304 306   BMET  Recent Labs  04/10/13 0450 04/12/13 0438  NA 134* 133*  K 3.5 3.2*  CL 99 99  CO2 28 28  CREATININE 0.77 0.63   LFT  Recent Labs  04/10/13 0450 04/12/13 0438  PROT 6.6 6.1  AST 25 27  ALT 11 11  ALKPHOS 41 43  BILITOT 0.7 0.7   PT/INR No results found for this basename: LABPROT, INR,  in the last 72 hours PANCREAS  Recent Labs  04/10/13 0450  LIPASE 35         Studies/Results: No results found.  Medications: I have reviewed the patient's current medications.  Assessment/Plan: 1. H pylori gastritis. Will begin tx with clarithromycin, metronidazole, protonix for 2 weeks discussed with daughter   Vertell Novak 04/12/2013, 7:42 AM

## 2013-04-13 DIAGNOSIS — K2940 Chronic atrophic gastritis without bleeding: Secondary | ICD-10-CM | POA: Diagnosis not present

## 2013-04-13 LAB — BASIC METABOLIC PANEL
BUN: 5 mg/dL — ABNORMAL LOW (ref 6–23)
Calcium: 9.1 mg/dL (ref 8.4–10.5)
Chloride: 92 mEq/L — ABNORMAL LOW (ref 96–112)
Creatinine, Ser: 0.64 mg/dL (ref 0.50–1.10)
GFR calc Af Amer: 90 mL/min — ABNORMAL LOW (ref >90)
GFR calc non Af Amer: 77 mL/min — ABNORMAL LOW (ref >90)
Glucose, Bld: 87 mg/dL (ref 70–99)
Potassium: 3.2 mEq/L — ABNORMAL LOW (ref 3.5–5.1)

## 2013-04-13 LAB — CBC
HCT: 32.6 % — ABNORMAL LOW (ref 36.0–46.0)
Hemoglobin: 11.2 g/dL — ABNORMAL LOW (ref 12.0–15.0)
MCV: 83 fL (ref 78.0–100.0)
RDW: 16 % — ABNORMAL HIGH (ref 11.5–15.5)
WBC: 8.6 10*3/uL (ref 4.0–10.5)

## 2013-04-13 LAB — DIGOXIN LEVEL: Digoxin Level: 1 ng/mL (ref 0.8–2.0)

## 2013-04-13 MED ORDER — CLARITHROMYCIN 250 MG PO TABS: ORAL_TABLET | ORAL | Status: DC

## 2013-04-13 MED ORDER — POTASSIUM CHLORIDE CRYS ER 20 MEQ PO TBCR
40.0000 meq | EXTENDED_RELEASE_TABLET | Freq: Two times a day (BID) | ORAL | Status: DC
  Administered 2013-04-13: 40 meq via ORAL
  Filled 2013-04-13 (×2): qty 2

## 2013-04-13 MED ORDER — POTASSIUM CHLORIDE CRYS ER 20 MEQ PO TBCR: EXTENDED_RELEASE_TABLET | ORAL | Status: DC

## 2013-04-13 MED ORDER — METRONIDAZOLE 500 MG PO TABS: ORAL_TABLET | ORAL | Status: DC

## 2013-04-13 MED ORDER — LANSOPRAZOLE 15 MG PO CPDR: DELAYED_RELEASE_CAPSULE | ORAL | Status: DC

## 2013-04-13 NOTE — Progress Notes (Signed)
CSW spoke with Patient's daughter, [name], explained that CSW can attempt for blue medicare auth but it doesn't appear that she has a skilled need as physical therapy only recommended home health. She asked that CSW speak with MD. CSW spoke with Dr. Link Snuffer. He states to have physical therapy see patient again today and if their recommendation was the same, that she could go home but if they changed it to snf, they would wait until Monday. Physical therapy resaw patient and patient still is ambulating well enough to go home. CSW met with patient to discuss why she is anxious about going home. Patient states that she is worried about being alone because she feels weak. CSW discussed that physical therapy is confident in her. She is worried about what would happen if she were home alone and got sick. She asked CSW to call her daughter. CSW called her daughter, [name], while CSW was in room. CSW explained situation and discussed private paying snf versus private paying for a home health aide. She agrees that taking patient home would likely be the better option. She asked that CSW discuss with MD whether or not patient can stay a few more days while they set that up. CSW called MD, CSW explained daughters request and ask that he discuss it with her. CSW received call back from [facility] and she spoke with MD and they will take patient home today with home health. RN care manager informed of same.  Jamerius Boeckman C. Laveda Demedeiros MSW, LCSW [phone]

## 2013-04-13 NOTE — Progress Notes (Signed)
Subjective: Pleasantly demented this AM  She has concerns about going home alone w/ daughter  Feels she may need assistance for a few weeks w/ transition home  She is not eating all her meals  No further complaints    Objective: Vital signs in last 24 hours: Temp:  [97.4 F (36.3 C)-98.5 F (36.9 C)] 98.4 F (36.9 C) (11/28 0600) Pulse Rate:  [78-85] 85 (11/28 0600) Resp:  [16-18] 18 (11/28 0600) BP: (132-155)/(75-81) 132/75 mmHg (11/28 0600) SpO2:  [92 %-93 %] 93 % (11/28 0600)  Intake/Output from previous day: 11/27 0701 - 11/28 0700 In: 2810 [P.O.:960; I.V.:1850] Out: 1175 [Urine:1175] Intake/Output this shift:    PE  Gen - A and O - looks better than expected Oral membranes moist,  neck supple  lungs clear no wheeze,  ht regular   abd sofy NT   Lab Results   Recent Labs  04/12/13 0438 04/13/13 0445  WBC 7.2 8.6  RBC 3.80* 3.93  HGB 11.0* 11.2*  HCT 31.9* 32.6*  MCV 83.9 83.0  MCH 28.9 28.5  RDW 16.3* 16.0*  PLT 306 321    Recent Labs  04/12/13 0438 04/13/13 0445  NA 133* 128*  K 3.2* 3.2*  CL 99 92*  CO2 28 27  GLUCOSE 78 87  BUN 6 5*  CREATININE 0.63 0.64  CALCIUM 9.0 9.1    Studies/Results: No results found.  Scheduled Meds: . clarithromycin  250 mg Oral Q12H  . digoxin  125 mcg Oral Daily  . enoxaparin (LOVENOX) injection  30 mg Subcutaneous Q24H  . metroNIDAZOLE  500 mg Oral BID  . multivitamin-prenatal  1 tablet Oral Daily  . pantoprazole  40 mg Oral BID  . potassium chloride SA  40 mEq Oral BID  . pregabalin  100 mg Oral QHS   Continuous Infusions:   PRN Meds:oxyCODONE-acetaminophen  Assessment/Plan:  H pylori gastritis. clarithromycin, metronidazole, protonix for 2 weeks.  Stable for discharge on PO meds   Benign essential HTN : BP still OK off of meds   Loss of weight c Mild malnutrition : low body wt  Unsteady gait/Deconditioning and AFTT - requesting PT reeval. If they agree w/ SNF placement then [place]  Monday. If they feel home w/ HHPT then home today or tomorrow pending family availability since she will need assistance    Atrial fibrillation Maintaining SR. Dig level WNL   Goiter : TSH WNL    Memory deficit : mild   Chronic airway obstruction, not elsewhere classified   Hyponatremia: mild. Stable  Hypokalemia - repleting. Checking daily   Disposition - Anticipate D/C home w/ [facility] today/tomorrow vs SNF Monday pending PT notes today      LOS: 4 days   Quantavis Obryant 04/13/2013, 9:56 AM

## 2013-04-13 NOTE — Care Management Note (Signed)
Cm s[oke with patient at the bedside concerning discharge planning. PT recommendation for HHPT. Pt lives alone, unable to dc home independently. Per pt adult daughter lives next door by works full time and cares for a disabled spouse. Per pt willingly to pay out of pocket for SNF. CSW notified. Cm provided pt list of home health agencies and private duty care list in case unable to afford out of pocket cost for SNF.   Roxy Manns Diana Lovins,MSN,RN [phone]

## 2013-04-13 NOTE — Discharge Summary (Addendum)
Physician Discharge Summary    [name]  MR#: [id number]  DOB:26-Nov-1922  Date of Admission: 04/09/2013 Date of Discharge: 04/13/2013  Attending Physician:Athen Riel  Patient's WUJ:[id number] Hessie Diener, MD  Consults:Treatment Team:  Vertell Novak., MD Levert Feinstein, MD    Discharge Diagnoses: Principal Problem:   Abdominal  pain, other specified site Active Problems:   Benign essential HTN   Loss of weight   Unsteady gait   FTT (failure to thrive) in adult   Mild malnutrition   Non Hodgkin's lymphoma   Atrial fibrillation   Goiter   Memory deficit   Chronic airway obstruction, not elsewhere classified   Protein-calorie malnutrition, severe   Discharge Medications:   Medication List    STOP taking these medications       amLODipine 5 MG tablet  Commonly known as:  NORVASC     atenolol 25 MG tablet  Commonly known as:  TENORMIN      TAKE these medications       clarithromycin 250 MG tablet  Commonly known as:  BIAXIN  Take 1 tab every twelve hours. Last dosage in evening of 12/10     digoxin 0.125 MG tablet  Commonly known as:  LANOXIN  Take 125 mcg by mouth daily.     donepezil 10 MG tablet  Commonly known as:  ARICEPT  Take 10 mg by mouth at bedtime.     fenofibrate 160 MG tablet  Take 160 mg by mouth.     fish oil-omega-3 fatty acids 1000 MG capsule  Take 1 g by mouth daily.     lansoprazole 15 MG capsule  Commonly known as:  PREVACID  Take 1 po BID until 12/10, then resume taking 1 tab per day     LYRICA 100 MG capsule  Generic drug:  pregabalin  Take 100 mg by mouth at bedtime.     metroNIDAZOLE 500 MG tablet  Commonly known as:  FLAGYL  Take 1 tab by mouth twice daily with last dose in the evening on 04/25/13     multivitamin-prenatal 27-0.8 MG Tabs tablet  Take 1 tablet by mouth daily.     oxyCODONE-acetaminophen 5-325 MG per tablet  Commonly known as:  PERCOCET/ROXICET  Take 1 tablet by mouth every 6  (six) hours as needed for pain.     potassium chloride SA 20 MEQ tablet  Commonly known as:  K-DUR,KLOR-CON  Take 1 tab twice per day     VITAMIN D PO  Take 1 tablet by mouth daily.        Hospital Procedures: Ct Abdomen Pelvis Wo Contrast  04/02/2013   CLINICAL DATA:  Abdominal pain and nausea.  EXAM: CT ABDOMEN AND PELVIS WITHOUT CONTRAST  TECHNIQUE: Multidetector CT imaging of the abdomen and pelvis was performed following the standard protocol without intravenous contrast.  COMPARISON:  06/04/2009  FINDINGS: The lung bases demonstrate multiple bilateral pulmonary nodules, peribronchial thickening and tree-in-bud appearance likely reflecting a chronic inflammatory process or atypical infection. No pleural effusion.  The unenhanced appearance of the liver is grossly normal. The gallbladder is unremarkable. No common bile duct dilatation. The pancreas is grossly normal. The spleen is surgically absent. The adrenal glands and kidneys are grossly normal. No obvious ureteral or bladder calculi.  The stomach moderate wall thickening in the body region with thickened appearing gastric folds. Could not exclude an inflammatory or infiltrative process. Upper endoscopy may be helpful for further evaluation. The duodenum, small bowel and colon are  grossly normal. No obvious inflammatory changes, mass lesions or obstructive findings. The appendix is surgically absent. No mesenteric or retroperitoneal mass or adenopathy. Advanced atherosclerotic calcifications involving the aorta and branch vessels.  The uterus is surgically absent. The bladder is unremarkable. No pelvic mass or adenopathy. No significant free pelvic fluid collections. No inguinal mass or adenopathy.  The lumbar spine demonstrates stable Schmorl's nodes, Osteo poor Oasis and compression fractures.  IMPRESSION: Persistent chronic inflammatory changes involving the lung bases.  No acute abdominal/ pelvic findings, mass lesions or adenopathy.   Apparent wall thickening in the body region of the stomach. Could not exclude a inflammatory or infiltrative process. Upper endoscopy may be helpful for further evaluation.   Electronically Signed   By: Loralie Champagne M.D.   On: 04/02/2013 16:20   US Abdomen Complete  04/02/2013   CLINICAL DATA:  Upper abdominal pain.  Nausea.  EXAM: ULTRASOUND ABDOMEN COMPLETE  COMPARISON:  CT, 05/1909  FINDINGS: Gallbladder  No gallstones or wall thickening visualized. No sonographic Murphy sign noted.  Common bile duct  Diameter: 3 mm.  Liver  No focal lesion identified. Within normal limits in parenchymal echogenicity.  IVC  Within normal limits  Pancreas  Incomplete visualization.  Visualized portions are unremarkable.  Spleen  Size and appearance within normal limits.  Right Kidney  Length: 8.8 cm. Increased renal parenchymal echogenicity. No mass, stone or hydronephrosis.  Left Kidney  Length: 9.9 cm. Increased renal parenchymal echogenicity. No mass, stone or hydronephrosis.  Abdominal aorta  Atherosclerotic calcification and irregularity.  No aneurysm.  Small amount of ascites adjacent to the liver.  IMPRESSION: 1. No acute findings. Normal gallbladder with no bile duct dilation. 2. Echogenic kidneys consistent with medical renal disease. 3. Incomplete visualization of pancreas. 4. Small amount of ascites.   Electronically Signed   By: Amie Portland M.D.   On: 04/02/2013 12:36   Dg Abd Acute W/chest  04/02/2013   CLINICAL DATA:  Pain.  Weakness.  EXAM: ACUTE ABDOMEN SERIES (ABDOMEN 2 VIEW & CHEST 1 VIEW)  COMPARISON:  12/07/2012.  FINDINGS: Mediastinum and hilar structures normal. Chronic interstitial prominence noted consistent with interstitial fibrosis. Changes suggesting COPD. Stable nodularity suggests the possibility of prior granulomatous disease. No pneumothorax. Cardiomegaly, no CHF.  Surgical clips left upper quadrant. Gas pattern is nonspecific with stool throughout the colon. No free air. Severe  thoracolumbar and bilateral hip degenerative change. Surgical clips left lower pelvis. Aortoiliac atherosclerotic vascular disease.  IMPRESSION: 1. No acute abdominal abnormality identified. 2. Chronic interstitial lung disease and COPD.   Electronically Signed   By: Maisie Fus  Register   On: 04/02/2013 11:27    History of Present Illness: Brought to ED for decreased appetite, nausea, not feeling well, early satiety .   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 S/p colonoscopies with the removal of colon polyps and has had a sigmoid colectomy due to diverticulosis and stricture. She also has a history of CVA.  She was admitted to the hospital 7/14 with a history of hematemesis and underwent EGD by Dr Evette Cristal showing diffuse gastritis, hemoglobin was stable, treated with Prevacid and discharge for a brief period of time to SNF and gradually improved and was able to return home. She lives alone put her daughter lives close by. For the past months has been complaining of upper abdominal pain. She been taking Prevacid ever since her discharge.  Pain and feel early satiety a/w eating. At ER last week, Ultrasound of the gallbladder was normal there was a small amount of ascites. CT scan of the abdomen revealed moderate thickening of the stomach could not be certain if this wasn't inflammatory for infiltrated process. An MRI was attempted but the patient was unable to lie still and was admitted to the hospital.    Hospital Course: H pylori gastritis - EGD performed on 11/25. Narrowing of the body of the stomach, gastric biopsy showed h pylori. Patient will be placed on triple therapy for 2 weeks w/ close f/u.  She will resume daily dosing of PPI after completing BID dosing w/ triple therapy on 12/10. Patient should start on dysphagia diet and progress towards normal diet as tolerated   Hypokalemia - she  will be discharged on low dose supplements and should have her BMP checked in the next 7 days   A fib - dig level was at goal and she remain at reg rate   Hypovolemic hyponatremia - ensure adequate hydration. Rechecking BMP in next 7 days   HTN - 140-150s off of home amlodipine and atenolol. These can be resumed as needed at folow up   dementa - cont home meds      Day of Discharge Exam BP 132/75  Pulse 85  Temp(Src) 98.4 F (36.9 C) (Oral)  Resp 18  Ht 5\' 4"  (1.626 m)  Wt 78 lb (35.381 kg)  BMI 13.38 kg/m2  SpO2 93%  Physical Exam: Gen - Alert but altered to place and time. Pleasant  ENT -  MMM, trachea midline   neck supple  lungs - clear no wheeze, or  Crackles  ht - reg rate, ir/ir rhythm, no MRG  abd sofy NT   Discharge Labs:  Recent Labs  04/12/13 0438 04/13/13 0445  NA 133* 128*  K 3.2* 3.2*  CL 99 92*  CO2 28 27  GLUCOSE 78 87  BUN 6 5*  CREATININE 0.63 0.64  CALCIUM 9.0 9.1    Recent Labs  04/12/13 0438  AST 27  ALT 11  ALKPHOS 43  BILITOT 0.7  PROT 6.1  ALBUMIN 2.5*    Recent Labs  04/12/13 0438 04/13/13 0445  WBC 7.2 8.6  HGB 11.0* 11.2*  HCT 31.9* 32.6*  MCV 83.9 83.0  PLT 306 321   Lab Results  Component Value Date   INR 1.34 12/07/2012   INR 2.60* 07/24/2009   INR 4.12* 07/23/2009   No results found for this basename: CKTOTAL, CKMB, CKMBINDEX, TROPONINI,  in the last 72 hours No results found for this basename: TSH, T4TOTAL, FREET3, T3FREE, THYROIDAB,  in the last 72 hours No results found for this basename: VITAMINB12, FOLATE, FERRITIN, TIBC, IRON, RETICCTPCT,  in the last 72 hours  Discharge instructions: Discharge Orders   Future Orders Complete By Expires   Call MD for:  persistant dizziness or light-headedness  As directed    Call MD for:  persistant nausea and vomiting  As directed    Call MD for:  temperature >100.4  As directed    Diet - low sodium heart healthy  As directed    Increase activity slowly  As  directed      01-Home or Self Care   Disposition: home w/ HH    Follow-up Appts: Follow-up with Dr. Evlyn Kanner at [facility] within 7 days.  Call for appointment.  Condition on Discharge: stable   Tests Needing Follow-up:BMP  Time spent in  discharge (includes decision making & examination of pt): 45 minutes    Signed: Jveon Pound 04/13/2013, 11:28 AM

## 2013-04-13 NOTE — Care Management Note (Signed)
Cm spoke with pt's daughter [name] concerning discharge disposition.  Pt disposition is home with [facility] services.Per pt and daughter's choice [facility] to provide [facility] services. [facility] rep Judeth Cornfield notified at [phone]. Pt has RW for home DME use. Pt's daughter to provide transportation home. No other needs identified.   Roxy Manns Thomasenia Dowse,MSN,RN [phone]

## 2013-04-13 NOTE — Progress Notes (Signed)
Physical Therapy Treatment Patient Details Name: ZANYIAH POSTEN MRN: [id number] DOB: [birth year] Today's Date: 04/13/2013 Time: 4098-1191 PT Time Calculation (min): 25 min  PT Assessment / Plan / Recommendation  History of Present Illness 79 y.o. female with h/o lymphoma, a fib, CVA admitted with abdominal pain   PT Comments   Assisted pt OOB to amb to BR then in hallway.   Follow Up Recommendations  Home health PT     Does the patient have the potential to tolerate intense rehabilitation     Barriers to Discharge        Equipment Recommendations       Recommendations for Other Services    Frequency Min 3X/week   Progress towards PT Goals Progress towards PT goals: Progressing toward goals  Plan      Precautions / Restrictions Precautions Precautions: None Restrictions Weight Bearing Restrictions: No    Pertinent Vitals/Pain C/o mild back pain    Mobility  Bed Mobility Bed Mobility: Supine to Sit Supine to Sit: 6: Modified independent (Device/Increase time) Details for Bed Mobility Assistance: increased time Transfers Transfers: Stand to Sit Stand to Sit: 6: Modified independent (Device/Increase time);To toilet;To bed Details for Transfer Assistance: increased time Ambulation/Gait Ambulation/Gait Assistance: 5: Supervision Ambulation Distance (Feet): 285 Feet Assistive device: Rolling walker Ambulation/Gait Assistance Details: good alternating gait with no LOB and good safety cognition and environment awareness.  Gait Pattern: Within Functional Limits Gait velocity: WNL      PT Goals (current goals can now be found in the care plan section)    Visit Information  Last PT Received On: 04/13/13 Assistance Needed: +1 History of Present Illness: 79 y.o. female with h/o lymphoma, a fib, CVA admitted with abdominal pain    Subjective Data      Cognition       Balance     End of Session PT - End of Session Equipment Utilized During Treatment: Gait  belt Activity Tolerance: Patient tolerated treatment well Patient left: in chair;with call bell/phone within reach Nurse Communication: Mobility status   Felecia Shelling  PTA [facility]  Acute  Rehab Pager      [phone]

## 2013-04-15 ENCOUNTER — Other Ambulatory Visit: Payer: Medicare Other

## 2013-04-16 ENCOUNTER — Ambulatory Visit: Payer: Medicare Other | Admitting: Oncology

## 2013-04-16 ENCOUNTER — Other Ambulatory Visit: Payer: Medicare Other | Admitting: Lab

## 2013-04-18 ENCOUNTER — Inpatient Hospital Stay (HOSPITAL_COMMUNITY)
Admission: EM | Admit: 2013-04-18 | Discharge: 2013-04-21 | DRG: 640 | Disposition: A | Discharge status: Skilled Nursing Facility | Payer: Medicare Other | Attending: Endocrinology | Admitting: Endocrinology

## 2013-04-18 ENCOUNTER — Encounter (HOSPITAL_COMMUNITY): Payer: Self-pay | Admitting: Emergency Medicine

## 2013-04-18 DIAGNOSIS — G3184 Mild cognitive impairment, so stated: Secondary | ICD-10-CM | POA: Diagnosis present

## 2013-04-18 DIAGNOSIS — G589 Mononeuropathy, unspecified: Secondary | ICD-10-CM | POA: Diagnosis present

## 2013-04-18 DIAGNOSIS — R627 Adult failure to thrive: Secondary | ICD-10-CM | POA: Diagnosis present

## 2013-04-18 DIAGNOSIS — E43 Unspecified severe protein-calorie malnutrition: Secondary | ICD-10-CM | POA: Diagnosis present

## 2013-04-18 DIAGNOSIS — R1013 Epigastric pain: Secondary | ICD-10-CM

## 2013-04-18 DIAGNOSIS — E785 Hyperlipidemia, unspecified: Secondary | ICD-10-CM | POA: Diagnosis present

## 2013-04-18 DIAGNOSIS — A048 Other specified bacterial intestinal infections: Secondary | ICD-10-CM | POA: Diagnosis present

## 2013-04-18 DIAGNOSIS — I4891 Unspecified atrial fibrillation: Secondary | ICD-10-CM | POA: Diagnosis present

## 2013-04-18 DIAGNOSIS — Z681 Body mass index (BMI) 19 or less, adult: Secondary | ICD-10-CM

## 2013-04-18 DIAGNOSIS — J449 Chronic obstructive pulmonary disease, unspecified: Secondary | ICD-10-CM | POA: Diagnosis present

## 2013-04-18 DIAGNOSIS — IMO0002 Reserved for concepts with insufficient information to code with codable children: Secondary | ICD-10-CM

## 2013-04-18 DIAGNOSIS — Z880 Allergy status to penicillin: Secondary | ICD-10-CM

## 2013-04-18 DIAGNOSIS — I10 Essential (primary) hypertension: Secondary | ICD-10-CM | POA: Diagnosis present

## 2013-04-18 DIAGNOSIS — E860 Dehydration: Secondary | ICD-10-CM

## 2013-04-18 DIAGNOSIS — R197 Diarrhea, unspecified: Secondary | ICD-10-CM | POA: Diagnosis present

## 2013-04-18 DIAGNOSIS — M810 Age-related osteoporosis without current pathological fracture: Secondary | ICD-10-CM | POA: Diagnosis present

## 2013-04-18 DIAGNOSIS — D649 Anemia, unspecified: Secondary | ICD-10-CM | POA: Diagnosis present

## 2013-04-18 DIAGNOSIS — Z79899 Other long term (current) drug therapy: Secondary | ICD-10-CM

## 2013-04-18 DIAGNOSIS — E871 Hypo-osmolality and hyponatremia: Principal | ICD-10-CM | POA: Diagnosis present

## 2013-04-18 DIAGNOSIS — J4489 Other specified chronic obstructive pulmonary disease: Secondary | ICD-10-CM | POA: Diagnosis present

## 2013-04-18 DIAGNOSIS — Z87898 Personal history of other specified conditions: Secondary | ICD-10-CM

## 2013-04-18 LAB — COMPREHENSIVE METABOLIC PANEL
AST: 34 U/L (ref 0–37)
Albumin: 2.9 g/dL — ABNORMAL LOW (ref 3.5–5.2)
BUN: 26 mg/dL — ABNORMAL HIGH (ref 6–23)
Calcium: 10.4 mg/dL (ref 8.4–10.5)
Chloride: 99 mEq/L (ref 96–112)
Creatinine, Ser: 0.84 mg/dL (ref 0.50–1.10)
GFR calc Af Amer: 70 mL/min — ABNORMAL LOW (ref >90)
Total Bilirubin: 0.5 mg/dL (ref 0.3–1.2)

## 2013-04-18 LAB — URINALYSIS, ROUTINE W REFLEX MICROSCOPIC
Bilirubin Urine: NEGATIVE
Hgb urine dipstick: NEGATIVE
Ketones, ur: NEGATIVE mg/dL
Nitrite: NEGATIVE
Protein, ur: 30 mg/dL — AB
Specific Gravity, Urine: 1.021 (ref 1.005–1.030)
Urobilinogen, UA: 0.2 mg/dL (ref 0.0–1.0)
pH: 7.5 (ref 5.0–8.0)

## 2013-04-18 LAB — CBC WITH DIFFERENTIAL/PLATELET
Basophils Absolute: 0.1 10*3/uL (ref 0.0–0.1)
Basophils Relative: 1 % (ref 0–1)
Eosinophils Absolute: 0.1 10*3/uL (ref 0.0–0.7)
Eosinophils Relative: 2 % (ref 0–5)
HCT: 34.5 % — ABNORMAL LOW (ref 36.0–46.0)
Hemoglobin: 11.9 g/dL — ABNORMAL LOW (ref 12.0–15.0)
Lymphocytes Relative: 26 % (ref 12–46)
MCH: 28.9 pg (ref 26.0–34.0)
MCHC: 34.5 g/dL (ref 30.0–36.0)
MCV: 83.7 fL (ref 78.0–100.0)
Monocytes Absolute: 1.3 10*3/uL — ABNORMAL HIGH (ref 0.1–1.0)
Monocytes Relative: 20 % — ABNORMAL HIGH (ref 3–12)
Neutro Abs: 3.2 10*3/uL (ref 1.7–7.7)
Platelets: 436 10*3/uL — ABNORMAL HIGH (ref 150–400)
RDW: 17.1 % — ABNORMAL HIGH (ref 11.5–15.5)

## 2013-04-18 LAB — BILIRUBIN, TOTAL: Total Bilirubin: 0.5 mg/dL (ref 0.3–1.2)

## 2013-04-18 LAB — URINE MICROSCOPIC-ADD ON

## 2013-04-18 LAB — LIPASE, BLOOD: Lipase: 64 U/L — ABNORMAL HIGH (ref 11–59)

## 2013-04-18 LAB — LACTIC ACID, PLASMA: Lactic Acid, Venous: 1.2 mmol/L (ref 0.5–2.2)

## 2013-04-18 MED ORDER — DONEPEZIL HCL 10 MG PO TABS
10.0000 mg | ORAL_TABLET | Freq: Every day | ORAL | Status: DC
  Administered 2013-04-18 – 2013-04-20 (×3): 10 mg via ORAL
  Filled 2013-04-18 (×4): qty 1

## 2013-04-18 MED ORDER — CLARITHROMYCIN 250 MG PO TABS
250.0000 mg | ORAL_TABLET | Freq: Two times a day (BID) | ORAL | Status: DC
  Administered 2013-04-18 – 2013-04-21 (×6): 250 mg via ORAL
  Filled 2013-04-18 (×7): qty 1

## 2013-04-18 MED ORDER — CLARITHROMYCIN 500 MG PO TABS: 500.0000 mg | ORAL_TABLET | Freq: Two times a day (BID) | ORAL | Status: DC

## 2013-04-18 MED ORDER — SODIUM CHLORIDE 0.9 % IV SOLN
INTRAVENOUS | Status: DC
  Administered 2013-04-18 – 2013-04-20 (×5): via INTRAVENOUS

## 2013-04-18 MED ORDER — PANTOPRAZOLE SODIUM 40 MG PO TBEC
40.0000 mg | DELAYED_RELEASE_TABLET | Freq: Two times a day (BID) | ORAL | Status: DC
  Administered 2013-04-18 – 2013-04-21 (×6): 40 mg via ORAL
  Filled 2013-04-18 (×8): qty 1

## 2013-04-18 MED ORDER — SODIUM CHLORIDE 0.9 % IV SOLN
Freq: Once | INTRAVENOUS | Status: AC
  Administered 2013-04-18: 15:00:00 via INTRAVENOUS

## 2013-04-18 MED ORDER — PREGABALIN 100 MG PO CAPS
100.0000 mg | ORAL_CAPSULE | Freq: Every day | ORAL | Status: DC
Start: 2013-04-18 — End: 2013-04-21
  Administered 2013-04-18 – 2013-04-20 (×3): 100 mg via ORAL
  Filled 2013-04-18 (×3): qty 1

## 2013-04-18 MED ORDER — POTASSIUM CHLORIDE CRYS ER 20 MEQ PO TBCR
20.0000 meq | EXTENDED_RELEASE_TABLET | Freq: Every day | ORAL | Status: DC
  Administered 2013-04-18 – 2013-04-21 (×4): 20 meq via ORAL
  Filled 2013-04-18 (×4): qty 1

## 2013-04-18 MED ORDER — PRENATAL 27-0.8 MG PO TABS
1.0000 | ORAL_TABLET | Freq: Every day | ORAL | Status: DC
  Administered 2013-04-19 – 2013-04-21 (×3): 1 via ORAL
  Filled 2013-04-18 (×3): qty 1

## 2013-04-18 MED ORDER — BISMUTH SUBSALICYLATE 262 MG PO CHEW
524.0000 mg | CHEWABLE_TABLET | ORAL | Status: DC | PRN
  Filled 2013-04-18: qty 2

## 2013-04-18 MED ORDER — ONDANSETRON HCL 4 MG/2ML IJ SOLN: 4.0000 mg | Freq: Four times a day (QID) | INTRAMUSCULAR | Status: DC | PRN

## 2013-04-18 MED ORDER — ONDANSETRON HCL 4 MG PO TABS: 4.0000 mg | ORAL_TABLET | Freq: Four times a day (QID) | ORAL | Status: DC | PRN

## 2013-04-18 MED ORDER — ACETAMINOPHEN 325 MG PO TABS: 650.0000 mg | ORAL_TABLET | Freq: Four times a day (QID) | ORAL | Status: DC | PRN

## 2013-04-18 MED ORDER — HYDROCODONE-ACETAMINOPHEN 5-325 MG PO TABS: 1.0000 | ORAL_TABLET | ORAL | Status: DC | PRN

## 2013-04-18 MED ORDER — ACETAMINOPHEN 650 MG RE SUPP: 650.0000 mg | Freq: Four times a day (QID) | RECTAL | Status: DC | PRN

## 2013-04-18 MED ORDER — ENOXAPARIN SODIUM 30 MG/0.3ML ~~LOC~~ SOLN
30.0000 mg | SUBCUTANEOUS | Status: DC
  Administered 2013-04-18 – 2013-04-20 (×3): 30 mg via SUBCUTANEOUS
  Filled 2013-04-18 (×4): qty 0.3

## 2013-04-18 MED ORDER — ZOLPIDEM TARTRATE 5 MG PO TABS
5.0000 mg | ORAL_TABLET | Freq: Every evening | ORAL | Status: DC | PRN
  Administered 2013-04-19 – 2013-04-20 (×2): 5 mg via ORAL
  Filled 2013-04-18 (×2): qty 1

## 2013-04-18 MED ORDER — SODIUM CHLORIDE 0.9 % IV BOLUS (SEPSIS)
1000.0000 mL | Freq: Once | INTRAVENOUS | Status: AC
  Administered 2013-04-18: 1000 mL via INTRAVENOUS

## 2013-04-18 MED ORDER — DIGOXIN 125 MCG PO TABS
125.0000 ug | ORAL_TABLET | Freq: Every day | ORAL | Status: DC
  Administered 2013-04-18 – 2013-04-21 (×4): 125 ug via ORAL
  Filled 2013-04-18 (×4): qty 1

## 2013-04-18 MED ORDER — METRONIDAZOLE 500 MG PO TABS
500.0000 mg | ORAL_TABLET | Freq: Three times a day (TID) | ORAL | Status: DC
  Administered 2013-04-18 – 2013-04-21 (×8): 500 mg via ORAL
  Filled 2013-04-18 (×11): qty 1

## 2013-04-18 NOTE — Progress Notes (Signed)
   CARE MANAGEMENT ED NOTE 04/18/2013  Patient:  Diana [name], Diana [name]   Account Number:  [id number]  Date Initiated:  04/18/2013  Documentation initiated by:  Edd Arbour  Subjective/Objective Assessment:   79 yr old female blue medicare pt Recent hospitalization 04/09/13 with d/c on 11/28//14 from [facility]-2 admissions, 2 ED visits in last 6 months c/o of diarrhea for this [facility] ED visit     Subjective/Objective Assessment Detail:   Pt already active for [facility], aide, PT/OT with [facility] per their in house coordinator  pcp Diana [name] Diana [name]     Action/Plan:   Cm received [name] call from [place] [facility] confirming pt active services with [facility] cm spoke with ED SW, Baxter Hire Updated EDP Micheline Maze   Action/Plan Detail:   Additional service of HHSW added for the pt She presently receiving HHRN, aide, PT and OT from [facility]) Kristen of [facility] made aware of additional services needed for the pt No need of face to face -one was recently completed   Anticipated DC Date:  04/18/2013     Status Recommendation to Physician:  Recommend OBV Status instead of IP Result of Recommendation:  Disagreed  Other ED Services  Consult Working Plan   In-house referral  Clinical Social Worker   DC Associate Professor  Other  Outpatient Services - Pt will follow up   [facility] Choice  HOME HEALTH   Choice offered to / List presented to:       [facility] arranged  HH-6 SOCIAL WORKER      HH agency  [facility]    Status of service:  Completed, signed off  ED Comments:  04/18/13 1613 CM spoke with Dr Wylene Simmer at [phone] about admisssion status ([facility] notes)  ED Comments Detail:

## 2013-04-18 NOTE — ED Notes (Signed)
Pt was admitted last week for H. Pylori pt was discharged home and shouldn't have been per pt's daughter. Pt has been weak and taking the antibiotics but has caused her to have diarrhea and not able to take her meds and loss of appetite.

## 2013-04-18 NOTE — Progress Notes (Signed)
CSW met with pt and pt daughter at bedside.Pt and pt daughter shared that patient has become more weak since her discharge on Friday. Pt and patient daughter stated that patient was able to walk down and back in the hallway upon discharge. Pt daughter states that patient is able to make it to the bathroom crawling. Pt is not eating or drinking, and is now having diarrhea from antibiotic. CSW, pt, and pt daugher discussed that patient can be placed in skilled nursing from home with assitance from home health social worker. Pt is still pending further medical evaluation to determine admission status. CSW informed patient and patient daughter, that if patient was admitted and pt was recommended for snf then csw could assist with pt placement. Pt daughter shared that patient has services lined up for 24 hour care. Patient daughter is mostly concerned that patient is not eating or drinking and having diarrhea. Pt daughter is a Producer, television/film/video. Pt daughter stated she was interested in [facility]. Pt gave csw permission to send clinical information to [facility] in order to assist with placement.   Catha Gosselin, LCSW [phone]  ED CSW .04/18/2013 1454pm

## 2013-04-18 NOTE — Progress Notes (Signed)
[facility]  Patient Status: Active (receiving services up to time of hospitalization)  [facility] is providing the following services: RN, PT, OT and HHA  If patient discharges after hours, please call [phone].   [name] 04/18/2013, 1:15 PM

## 2013-04-18 NOTE — Progress Notes (Signed)
   CARE MANAGEMENT ED NOTE 04/18/2013  Patient:  [name], [name]   Account Number:  [id number]  Date Initiated:  04/18/2013  Documentation initiated by:  Edd Arbour  Subjective/Objective Assessment:   79 yr old female blue medicare pt Recent hospitalization 04/09/13 with d/c on 11/28//14 from [facility] with [facility]-2 admissions, 2 ED visits in last 6 months c/o of diarrhea for this [facility] ED visit     Subjective/Objective Assessment Detail:   Pt already active for [facility], aide, PT/OT with [facility] per their in house coordinator  pcp Rosalio Loud [place]     Action/Plan:   Cm received a call from [place] [facility] confirming pt active services with [facility] cm spoke with ED SW, Baxter Hire Updated EDP Micheline Maze   Action/Plan Detail:   Additional service of HHSW added for the pt She presently receiving HHRN, aide, PT and OT from [facility]) Kristen of [facility] made aware of additional services needed for the pt No need of face to face -one was recently completed   Anticipated DC Date:  04/18/2013     Status Recommendation to Physician:   Result of Recommendation:    Other ED Services  Consult Working Plan   In-house referral  Clinical Social Worker   DC Associate Professor  Other  Outpatient Services - Pt will follow up   [facility] Choice  HOME HEALTH   Choice offered to / List presented to:       [facility] arranged  HH-6 SOCIAL WORKER      HH agency  [facility]    Status of service:    ED Comments:   ED Comments Detail:

## 2013-04-18 NOTE — ED Provider Notes (Signed)
CSN: [id number]     Arrival date & time 04/18/13  1018 History   First MD Initiated Contact with Patient 04/18/13 1032     Chief Complaint  Patient presents with  . Weakness  . Diarrhea   (Consider location/radiation/quality/duration/timing/severity/associated sxs/prior Treatment) Patient is a 79 y.o. female presenting with diarrhea.  Diarrhea Quality:  Watery Severity:  Moderate Onset quality:  Gradual Number of episodes:  >15 Duration:  3 days Timing:  Intermittent Progression:  Worsening Relieved by:  Nothing Worsened by:  Nothing tried Ineffective treatments:  None tried Associated symptoms: abdominal pain   Associated symptoms: no arthralgias, no chills, no recent cough, no diaphoresis, no fever, no headaches, no myalgias, no URI and no vomiting   Abdominal pain:    Location:  Epigastric   Quality:  Aching   Severity:  Mild   Timing:  Constant   Progression:  Partially resolved   Chronicity:  Recurrent Risk factors: recent antibiotic use   Risk factors: no sick contacts, no suspicious food intake and no travel to endemic areas     Past Medical History  Diagnosis Date  . Cancer   . Lymphoma, high grade 07/23/2011  . Benign essential HTN 07/23/2011  . Hyperlipidemia 07/23/2011  . 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 DJD (degenerative joint disease), lumbar 07/23/2011  . Current non-smoker but past smoking history unknown 12/08/12   Past Surgical History  Procedure Laterality Date  . Colon surgery    . Appendectomy    . Abdominal hysterectomy    . Hernia repair    . Tonsillectomy    . Splenectomy    . Shoulder surgery    . Hemorroidectomy    . Esophagogastroduodenoscopy N/A 12/08/2012    Procedure: ESOPHAGOGASTRODUODENOSCOPY (EGD);  Surgeon: Graylin Shiver, MD;  Location: [facility];  Service: Endoscopy;  Laterality: N/A;  . Esophagogastroduodenoscopy N/A 04/10/2013    Procedure: ESOPHAGOGASTRODUODENOSCOPY (EGD);  Surgeon: Vertell Novak., MD;  Location: [facility];  Service: Endoscopy;  Laterality: N/A;   History reviewed. No pertinent family history. History  Substance Use Topics  . Smoking status: Never Smoker   . Smokeless tobacco: Never Used  . Alcohol Use: No   OB History   Grav Para Term Preterm Abortions TAB SAB Ect Mult Living                 Review of Systems  Constitutional: Negative for fever, chills, diaphoresis, activity change, appetite change and fatigue.  HENT: Negative for congestion, facial swelling, rhinorrhea and sore throat.   Eyes: Negative for photophobia and discharge.  Respiratory: Negative for cough, chest tightness and shortness of breath.   Cardiovascular: Negative for chest pain, palpitations and leg swelling.  Gastrointestinal: Positive for abdominal pain and diarrhea. Negative for nausea and vomiting.  Endocrine: Negative for polydipsia and polyuria.  Genitourinary: Negative for dysuria, frequency, difficulty urinating and pelvic pain.  Musculoskeletal: Negative for arthralgias, back pain, myalgias, neck pain and neck stiffness.  Skin: Negative for color change and wound.  Allergic/Immunologic: Negative for immunocompromised state.  Neurological: Negative for facial asymmetry, weakness, numbness and headaches.  Hematological: Does not bruise/bleed easily.  Psychiatric/Behavioral: Negative for confusion and agitation.    Allergies  Iodine; Penicillins; and Shellfish-derived products  Home Medications   No current outpatient prescriptions on file. BP 151/94  Pulse 95  Temp(Src) 98.5 F (36.9 C) (Oral)  Resp 18  Ht 5\' 4"  (1.626 m)  Wt 83 lb 6.4 oz (37.83 kg)  BMI 14.31 kg/m2  SpO2 92% Physical Exam  Constitutional: She is oriented to person, place, and time. She appears well-nourished. No distress.  Thin   HENT:  Head: Normocephalic and atraumatic.  Mouth/Throat: No oropharyngeal exudate.  Tachy mucus membranes  Eyes: Pupils are equal, round, and reactive to light.  Neck: Normal range  of motion. Neck supple.  Cardiovascular: Normal rate, regular rhythm and normal heart sounds.  Exam reveals no gallop and no friction rub.   No murmur heard. Pulmonary/Chest: Effort normal and breath sounds normal. No respiratory distress. She has no wheezes. She has no rales.  Abdominal: Soft. Bowel sounds are normal. She exhibits no distension and no mass. There is tenderness in the epigastric area. There is no rigidity, no rebound and no guarding.  Musculoskeletal: Normal range of motion. She exhibits no edema and no tenderness.  Neurological: She is alert and oriented to person, place, and time.  Skin: Skin is warm and dry.  Psychiatric: She has a normal mood and affect.    ED Course  Procedures (including critical care time) Labs Review Labs Reviewed  CBC WITH DIFFERENTIAL - Abnormal; Notable for the following:    Hemoglobin 11.9 (*)    HCT 34.5 (*)    RDW 17.1 (*)    Platelets 436 (*)    Monocytes Relative 20 (*)    Monocytes Absolute 1.3 (*)    All other components within normal limits  COMPREHENSIVE METABOLIC PANEL - Abnormal; Notable for the following:    Sodium 134 (*)    Glucose, Bld 104 (*)    BUN 26 (*)    Albumin 2.9 (*)    GFR calc non Af Amer 60 (*)    GFR calc Af Amer 70 (*)    All other components within normal limits  LIPASE, BLOOD - Abnormal; Notable for the following:    Lipase 64 (*)    All other components within normal limits  URINALYSIS, ROUTINE W REFLEX MICROSCOPIC - Abnormal; Notable for the following:    Color, Urine AMBER (*)    Protein, ur 30 (*)    Leukocytes, UA TRACE (*)    All other components within normal limits  URINE MICROSCOPIC-ADD ON - Abnormal; Notable for the following:    Bacteria, UA FEW (*)    All other components within normal limits  URINE CULTURE  CLOSTRIDIUM DIFFICILE BY PCR  LACTIC ACID, PLASMA  BILIRUBIN, TOTAL  CBC  COMPREHENSIVE METABOLIC PANEL  LIPASE, BLOOD   Imaging Review No results found.  EKG  Interpretation    Date/Time:    Ventricular Rate:    PR Interval:    QRS Duration:   QT Interval:    QTC Calculation:   R Axis:     Text Interpretation:              MDM   1. Failure to thrive   2. Protein-calorie malnutrition, severe   3. Diarrhea   4. Epigastric abdominal pain   5. Mild dehydration    Pt is a 79 y.o. female with Pmhx as above who presents with 3 days of worsening watery diarrhea, poor PO intake about 1 week after starting triple therapy treatment for h pylori.  On PE, pt mildly tachycardic, but temp, BP normal.  Mucus membranes tachy.  She has mild epigastric ttp on exam, but states her abdominal pain has been improving.    W/u Shows mild lipase elevation, mild hyponatremia, mild BUN elevation.  Urine not infected, WBC not elevated.  I have  spoke to family about findings.  Daughter states she refuses to take her home, that she cannot eat or drink, she will may out of pocket if need be and that she needs to go to a SNF, not home after discharge.  I have explained that I will call her PCP's group, but that they will be the final deciding factor for admission.  [facility] will come to ED to see her.  Social worker also called.       Shanna Cisco, MD 04/18/13 2007

## 2013-04-18 NOTE — H&P (Signed)
PCP:   Julian Hy, MD   Chief Complaint:  Failure to thrive, worsening symptoms at home.   HPI: Patient is an 79 year old female who was hospitalized last week for abd pain and found to be H. Pylori positive.  Placed on triple therapy.  Due to her history of cancer, advanced age and difficulty with care, SNF was recommended by her physicians but declined per her insurance company.  She was sent home and family arranged 24 hour care.  She unfortunately has had progression in her symptoms over the past 5 days with diarrhea, continued abd pain, poor PO intake with nausea and vomiting and is unable to get to the bathroom without crawling.  Was taken to the ER for evaluation and found to be in considerable distress.  I was asked to admit her.  Family is not happy that the SNF was denied on her last OV and she feels that she was "sent home too early" despite the need for some form of disposition.  Review of Systems:  Review of Systems  Constitutional: Negative for fever, chills, diaphoresis, activity change, appetite change and fatigue.  HENT: Negative for congestion, facial swelling, rhinorrhea and sore throat.  Eyes: Negative for photophobia and discharge.  Respiratory: Negative for cough, chest tightness and shortness of breath.  Cardiovascular: Negative for chest pain, palpitations and leg swelling.  Gastrointestinal: Positive for abdominal pain and diarrhea. Negative for nausea and vomiting.  Endocrine: Negative for polydipsia and polyuria.  Genitourinary: Negative for dysuria, frequency, difficulty urinating and pelvic pain.  Musculoskeletal: Negative for arthralgias, back pain, myalgias, neck pain and neck stiffness.  Skin: Negative for color change and wound.  Allergic/Immunologic: Negative for immunocompromised state.  Neurological: Negative for facial asymmetry, weakness, numbness and headaches.  Hematological: Does not bruise/bleed easily.  Psychiatric/Behavioral: Negative for  confusion and agitation.   Past Medical History: Past Medical History  Diagnosis Date  . Cancer   . Lymphoma, high grade 07/23/2011  . Benign essential HTN 07/23/2011  . Hyperlipidemia 07/23/2011  . 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 DJD (degenerative joint disease), lumbar 07/23/2011  . Current non-smoker but past smoking history unknown 12/08/12   Past Surgical History  Procedure Laterality Date  . Colon surgery    . Appendectomy    . Abdominal hysterectomy    . Hernia repair    . Tonsillectomy    . Splenectomy    . Shoulder surgery    . Hemorroidectomy    . Esophagogastroduodenoscopy N/A 12/08/2012    Procedure: ESOPHAGOGASTRODUODENOSCOPY (EGD);  Surgeon: Graylin Shiver, MD;  Location: [facility];  Service: Endoscopy;  Laterality: N/A;  . Esophagogastroduodenoscopy N/A 04/10/2013    Procedure: ESOPHAGOGASTRODUODENOSCOPY (EGD);  Surgeon: Vertell Novak., MD;  Location: [facility];  Service: Endoscopy;  Laterality: N/A;    Medications: Prior to Admission medications   Medication Sig Start Date End Date Taking? Authorizing Provider  Cholecalciferol (VITAMIN D PO) Take 1 tablet by mouth daily.   Yes Historical Provider, MD  clarithromycin (BIAXIN) 250 MG tablet Take 1 tab every twelve hours. Last dosage in evening of 12/10 04/13/13  Yes Alysia Penna, MD  digoxin (LANOXIN) 0.125 MG tablet Take 125 mcg by mouth daily.  06/10/11  Yes Historical Provider, MD  donepezil (ARICEPT) 10 MG tablet Take 10 mg by mouth at bedtime.   Yes Historical Provider, MD  fenofibrate 160 MG tablet Take 160 mg by mouth.  06/02/11  Yes Historical Provider, MD  fish oil-omega-3 fatty acids 1000 MG  capsule Take 1 g by mouth daily.   Yes Historical Provider, MD  lansoprazole (PREVACID) 15 MG capsule Take 1 po BID until 12/10, then resume taking 1 tab per day 04/13/13  Yes Alysia Penna, MD  LYRICA 100 MG capsule Take 100 mg by mouth at bedtime.  07/09/11  Yes Historical Provider, MD  metroNIDAZOLE  (FLAGYL) 500 MG tablet Take 1 tab by mouth twice daily with last dose in the evening on 04/25/13 04/13/13  Yes Alysia Penna, MD  oxyCODONE-acetaminophen (PERCOCET/ROXICET) 5-325 MG per tablet Take 1 tablet by mouth every 6 (six) hours as needed for pain.   Yes Historical Provider, MD  potassium chloride SA (K-DUR,KLOR-CON) 20 MEQ tablet Take 1 tab twice per day 04/13/13  Yes Alysia Penna, MD  Prenatal Vit-Fe Fumarate-FA (MULTIVITAMIN-PRENATAL) 27-0.8 MG TABS Take 1 tablet by mouth daily.   Yes Historical Provider, MD    Allergies:   Allergies  Allergen Reactions  . Iodine     Fever , chills  . Penicillins     Unknown   . Shellfish-Derived Products     Unknown     Social History:  reports that she has never smoked. She has never used smokeless tobacco. She reports that she does not drink alcohol or use illicit drugs.  Family History: History reviewed. No pertinent family history.  Physical Exam: Filed Vitals:   04/18/13 1135 04/18/13 1317 04/18/13 1558 04/18/13 1623  BP: 142/96 148/86 148/88 151/94  Pulse: 96 98 96 95  Temp:    98.5 F (36.9 C)  TempSrc:    Oral  Resp: 20 20 20 18   Height:    5\' 4"  (1.626 m)  Weight:    37.83 kg (83 lb 6.4 oz)  SpO2: 100% 100% 100% 92%   General appearance: alert, cooperative and appears stated age Head: Normocephalic, without obvious abnormality, atraumatic Eyes: conjunctivae/corneas clear. PERRL, EOM's intact.  Nose: Nares normal. Septum midline. Mucosa normal. No drainage or sinus tenderness. Throat: lips, mucosa, and tongue normal; teeth and gums normal Neck: no adenopathy, no carotid bruit, no JVD and thyroid not enlarged, symmetric, no tenderness/mass/nodules Resp: clear to auscultation bilaterally Cardio: regular rate and rhythm, S1, S2 normal, no murmur, click, rub or gallop GI: soft, diffusely tender with hypeactive bowel sounds. Extremities: extremities normal, atraumatic, no cyanosis or edema Pulses: 2+ and  symmetric Lymph nodes: Cervical adenopathy: no cervical lymphadenopathy Neurologic: Alert and oriented X 3, normal strength and tone. Normal symmetric reflexes.     Labs on Admission:   Recent Labs  04/18/13 1111  NA 134*  K 4.6  CL 99  CO2 27  GLUCOSE 104*  BUN 26*  CREATININE 0.84  CALCIUM 10.4    Recent Labs  04/18/13 1111 04/18/13 1608  AST 34  --   ALT 13  --   ALKPHOS 50  --   BILITOT 0.5 0.5  PROT 7.0  --   ALBUMIN 2.9*  --     Recent Labs  04/18/13 1111  LIPASE 64*    Recent Labs  04/18/13 1111  WBC 6.3  NEUTROABS 3.2  HGB 11.9*  HCT 34.5*  MCV 83.7  PLT 436*    Lab Results  Component Value Date   INR 1.34 12/07/2012   INR 2.60* 07/24/2009   INR 4.12* 07/23/2009    Radiological Exams on Admission: Ct Abdomen Pelvis Wo Contrast  04/02/2013   CLINICAL DATA:  Abdominal pain and nausea.  EXAM: CT ABDOMEN AND PELVIS WITHOUT CONTRAST  TECHNIQUE: Multidetector  CT imaging of the abdomen and pelvis was performed following the standard protocol without intravenous contrast.  COMPARISON:  06/04/2009  FINDINGS: The lung bases demonstrate multiple bilateral pulmonary nodules, peribronchial thickening and tree-in-bud appearance likely reflecting a chronic inflammatory process or atypical infection. No pleural effusion.  The unenhanced appearance of the liver is grossly normal. The gallbladder is unremarkable. No common bile duct dilatation. The pancreas is grossly normal. The spleen is surgically absent. The adrenal glands and kidneys are grossly normal. No obvious ureteral or bladder calculi.  The stomach moderate wall thickening in the body region with thickened appearing gastric folds. Could not exclude an inflammatory or infiltrative process. Upper endoscopy may be helpful for further evaluation. The duodenum, small bowel and colon are grossly normal. No obvious inflammatory changes, mass lesions or obstructive findings. The appendix is surgically absent. No  mesenteric or retroperitoneal mass or adenopathy. Advanced atherosclerotic calcifications involving the aorta and branch vessels.  The uterus is surgically absent. The bladder is unremarkable. No pelvic mass or adenopathy. No significant free pelvic fluid collections. No inguinal mass or adenopathy.  The lumbar spine demonstrates stable Schmorl's nodes, Osteo poor Oasis and compression fractures.  IMPRESSION: Persistent chronic inflammatory changes involving the lung bases.  No acute abdominal/ pelvic findings, mass lesions or adenopathy.  Apparent wall thickening in the body region of the stomach. Could not exclude a inflammatory or infiltrative process. Upper endoscopy may be helpful for further evaluation.   Electronically Signed   By: Loralie Champagne M.D.   On: 04/02/2013 16:20   US Abdomen Complete  04/02/2013   CLINICAL DATA:  Upper abdominal pain.  Nausea.  EXAM: ULTRASOUND ABDOMEN COMPLETE  COMPARISON:  CT, 05/1909  FINDINGS: Gallbladder  No gallstones or wall thickening visualized. No sonographic Murphy sign noted.  Common bile duct  Diameter: 3 mm.  Liver  No focal lesion identified. Within normal limits in parenchymal echogenicity.  IVC  Within normal limits  Pancreas  Incomplete visualization.  Visualized portions are unremarkable.  Spleen  Size and appearance within normal limits.  Right Kidney  Length: 8.8 cm. Increased renal parenchymal echogenicity. No mass, stone or hydronephrosis.  Left Kidney  Length: 9.9 cm. Increased renal parenchymal echogenicity. No mass, stone or hydronephrosis.  Abdominal aorta  Atherosclerotic calcification and irregularity.  No aneurysm.  Small amount of ascites adjacent to the liver.  IMPRESSION: 1. No acute findings. Normal gallbladder with no bile duct dilation. 2. Echogenic kidneys consistent with medical renal disease. 3. Incomplete visualization of pancreas. 4. Small amount of ascites.   Electronically Signed   By: Amie Portland M.D.   On: 04/02/2013 12:36   Dg  Abd Acute W/chest  04/02/2013   CLINICAL DATA:  Pain.  Weakness.  EXAM: ACUTE ABDOMEN SERIES (ABDOMEN 2 VIEW & CHEST 1 VIEW)  COMPARISON:  12/07/2012.  FINDINGS: Mediastinum and hilar structures normal. Chronic interstitial prominence noted consistent with interstitial fibrosis. Changes suggesting COPD. Stable nodularity suggests the possibility of prior granulomatous disease. No pneumothorax. Cardiomegaly, no CHF.  Surgical clips left upper quadrant. Gas pattern is nonspecific with stool throughout the colon. No free air. Severe thoracolumbar and bilateral hip degenerative change. Surgical clips left lower pelvis. Aortoiliac atherosclerotic vascular disease.  IMPRESSION: 1. No acute abdominal abnormality identified. 2. Chronic interstitial lung disease and COPD.   Electronically Signed   By: Maisie Fus  Register   On: 04/02/2013 11:27    Assessment/Plan Active Problems:   Failure to thrive-  Clearly the issue is that her insurance company  would not allow her to go to a SNF as was recommended by her medical team.  She has subsequently come back to the ER with worsening of status even with 24 hour home care, which likely could have been handled better if the SNF stay was initially allowed.  Will have PT, OT and Nutrition assess as well as social work to again attempt placement. H. Pylori +   Continue Triple therapy.  Had imaging/EGD per Randa Evens last week.  Pepto as needed for symptoms. Diarrhea- Will check for C. Dif, contact precautions.  Will repeat Lipase as well in AM Prerenal azotemia- IV fluids Hyponatremia- mild, liekly due to diarrhea. Lymphoma Hyperlipidemia HTN- Controlled   Deigo Alonso W 04/18/2013, 5:24 PM

## 2013-04-18 NOTE — Progress Notes (Signed)
   CARE MANAGEMENT ED NOTE 04/18/2013  Patient:  [name], [name]   Account Number:  [id number]  Date Initiated:  04/18/2013  Documentation initiated by:  Edd Arbour  Subjective/Objective Assessment:   79 yr old female blue medicare pt Recent hospitalization 04/09/13 with d/c on 11/28//14 from [facility]-2 admissions, 2 ED visits in last 6 months c/o of diarrhea for this [facility] ED visit     Subjective/Objective Assessment Detail:   Pt already active for [facility], aide, PT/OT with [facility] per their in house coordinator  pcp Rosalio Loud [facility]     Action/Plan:   Cm received a call from [facility] confirming pt active services with [facility] cm spoke with ED SW, Baxter Hire Updated EDP Micheline Maze   Action/Plan Detail:   Additional service of HHSW added for the pt She presently receiving HHRN, aide, PT and OT from [facility] Kristen of [facility] made aware of additional services needed for the pt No need of face to face -one was recently completed   Anticipated DC Date:  04/18/2013     Status Recommendation to Physician:  Recommend OBV Status instead of IP Result of Recommendation:  Disagreed  Other ED Services  Consult Working Plan   In-house referral  Clinical Social Worker   DC Associate Professor  Other  Outpatient Services - Pt will follow up   [facility] Choice  HOME HEALTH   Choice offered to / List presented to:       [facility] arranged  HH-6 SOCIAL WORKER      HH agency  [facility]    Status of service:  Completed, signed off  ED Comments:  04/18/13 1613 CM spoke with Dr Wylene Simmer at [phone] about admisssion status ([facility] notes)  ED Comments Detail:  04/18/13 1706 [facility] ED CM spoke with Dr Geoffry Paradise about pt's level of care and re admission ([facility] note)  [facility] ED CM entered SW consult in EPIC with following note: Dr Jacky Kindle states [facility] SW needs to speak with the family about private pay snf placement Please contact Dr  Jacky Kindle for further questions

## 2013-04-19 LAB — COMPREHENSIVE METABOLIC PANEL
ALT: 10 U/L (ref 0–35)
Albumin: 2.4 g/dL — ABNORMAL LOW (ref 3.5–5.2)
Alkaline Phosphatase: 43 U/L (ref 39–117)
BUN: 14 mg/dL (ref 6–23)
CO2: 25 mEq/L (ref 19–32)
Calcium: 9.2 mg/dL (ref 8.4–10.5)
GFR calc Af Amer: 87 mL/min — ABNORMAL LOW (ref >90)
GFR calc non Af Amer: 75 mL/min — ABNORMAL LOW (ref >90)
Glucose, Bld: 78 mg/dL (ref 70–99)
Potassium: 3.7 mEq/L (ref 3.5–5.1)
Sodium: 134 mEq/L — ABNORMAL LOW (ref 135–145)

## 2013-04-19 LAB — CBC
HCT: 31.7 % — ABNORMAL LOW (ref 36.0–46.0)
Hemoglobin: 10.8 g/dL — ABNORMAL LOW (ref 12.0–15.0)
MCH: 28.7 pg (ref 26.0–34.0)
MCV: 84.3 fL (ref 78.0–100.0)
RBC: 3.76 MIL/uL — ABNORMAL LOW (ref 3.87–5.11)
RDW: 17.3 % — ABNORMAL HIGH (ref 11.5–15.5)

## 2013-04-19 LAB — URINE CULTURE: Colony Count: NO GROWTH

## 2013-04-19 LAB — LIPASE, BLOOD: Lipase: 53 U/L (ref 11–59)

## 2013-04-19 MED ORDER — ENSURE COMPLETE PO LIQD
237.0000 mL | Freq: Two times a day (BID) | ORAL | Status: DC
  Administered 2013-04-19 – 2013-04-21 (×5): 237 mL via ORAL

## 2013-04-19 NOTE — Progress Notes (Signed)
INITIAL NUTRITION ASSESSMENT  Pt meets criteria for severe MALNUTRITION in the context of chronic illness as evidenced by severe muscle wasting and subcutaneous fat loss throughout body.  DOCUMENTATION CODES Per approved criteria  -Severe malnutrition in the context of chronic illness -Underweight   INTERVENTION: - Ensure Complete BID - Encouraged pt to consume all of her meals - Will continue to monitor   NUTRITION DIAGNOSIS: Increased nutrient needs related to underweight as evidenced by body mass index is 14.31 kg/(m^2).  Goal: Pt to consume >90% of meals/supplements  Monitor:  Weights, labs, intake  Reason for Assessment: Malnutrition screening tool, consult   79 y.o. female  Admitting Dx: Failure to thrive, worsening symptoms at home  ASSESSMENT: Pt discussed during multidisciplinary rounds. Pt known to RD from admission last month. Pt with hx of H. Pylori. Met with pt who reports eating 2 very small meals for the past week, mostly soups/desserts due to poor appetite, and having  nausea/vomiting/diarrhea. States before then she was eating 3 meals/day. Denies any nausea/vomiting/diarrhea today. Pt reports having some orange stool this morning which she reports is due to her medications. Reports eating all of her breakfast. Weight has improved from 78 pounds last month to 83 pounds.   Nutrition Focused Physical Exam:  Subcutaneous Fat:  Orbital Region: severe wasting Upper Arm Region: severe wasting Thoracic and Lumbar Region: NA  Muscle:  Temple Region: severe wasting Clavicle Bone Region: severe wasting Clavicle and Acromion Bone Region: severe wasting Scapular Bone Region: NA Dorsal Hand: severe wasting Patellar Region: severe wasting Anterior Thigh Region: severe wasting Posterior Calf Region: severe wasting  Edema: None noted    Height: Ht Readings from Last 1 Encounters:  04/18/13 5\' 4"  (1.626 m)    Weight: Wt Readings from Last 1 Encounters:   04/18/13 83 lb 6.4 oz (37.83 kg)    Ideal Body Weight: 120 lb   % Ideal Body Weight: 69%  Wt Readings from Last 10 Encounters:  04/18/13 83 lb 6.4 oz (37.83 kg)  04/09/13 78 lb (35.381 kg)  04/09/13 78 lb (35.381 kg)  12/11/12 86 lb 12.8 oz (39.372 kg)  12/11/12 86 lb 12.8 oz (39.372 kg)  10/04/12 92 lb 4.8 oz (41.867 kg)  01/24/12 94 lb 4.8 oz (42.774 kg)    Usual Body Weight: 92 lb in May 2014  % Usual Body Weight: 90%  BMI:  Body mass index is 14.31 kg/(m^2). underweight  Estimated Nutritional Needs: Kcal: 1400-1600 Protein: 65-80g Fluid: 1.4-1.6L/day  Skin: Left forearm skin tear   Diet Order: General  EDUCATION NEEDS: -No education needs identified at this time   Intake/Output Summary (Last 24 hours) at 04/19/13 1028 Last data filed at 04/19/13 0725  Gross per 24 hour  Intake    360 ml  Output      0 ml  Net    360 ml    Last BM: 12/3  Labs:   Recent Labs Lab 04/13/13 0445 04/18/13 1111 04/19/13 0412  NA 128* 134* 134*  K 3.2* 4.6 3.7  CL 92* 99 102  CO2 27 27 25   BUN 5* 26* 14  CREATININE 0.64 0.84 0.70  CALCIUM 9.1 10.4 9.2  GLUCOSE 87 104* 78    CBG (last 3)  No results found for this basename: GLUCAP,  in the last 72 hours  Scheduled Meds: . clarithromycin  250 mg Oral Q12H  . digoxin  125 mcg Oral Daily  . donepezil  10 mg Oral QHS  . enoxaparin (LOVENOX)  injection  30 mg Subcutaneous Q24H  . metroNIDAZOLE  500 mg Oral Q8H  . multivitamin-prenatal  1 tablet Oral Daily  . pantoprazole  40 mg Oral BID  . potassium chloride SA  20 mEq Oral Daily  . pregabalin  100 mg Oral QHS    Continuous Infusions: . sodium chloride 75 mL/hr at 04/19/13 7829    Past Medical History  Diagnosis Date  . Cancer   . Lymphoma, high grade 07/23/2011  . Benign essential HTN 07/23/2011  . Hyperlipidemia 07/23/2011  . 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 DJD (degenerative joint disease), lumbar 07/23/2011  . Current non-smoker but past smoking  history unknown 12/08/12    Past Surgical History  Procedure Laterality Date  . Colon surgery    . Appendectomy    . Abdominal hysterectomy    . Hernia repair    . Tonsillectomy    . Splenectomy    . Shoulder surgery    . Hemorroidectomy    . Esophagogastroduodenoscopy N/A 12/08/2012    Procedure: ESOPHAGOGASTRODUODENOSCOPY (EGD);  Surgeon: Graylin Shiver, MD;  Location: [facility];  Service: Endoscopy;  Laterality: N/A;  . Esophagogastroduodenoscopy N/A 04/10/2013    Procedure: ESOPHAGOGASTRODUODENOSCOPY (EGD);  Surgeon: Vertell Novak., MD;  Location: [facility];  Service: Endoscopy;  Laterality: N/A;    Levon Hedger MS, RD, LDN [phone] Pager [phone] After Hours Pager

## 2013-04-19 NOTE — Evaluation (Signed)
Occupational Therapy Evaluation Patient Details Name: [name] MRN: [id number] DOB: 1922-10-09 Today's Date: 04/19/2013 Time: 4010-2725 OT Time Calculation (min): 27 min  OT Assessment / Plan / Recommendation History of present illness Per chart:  79 year old female who was hospitalized last week for abd pain and found to be H. Pylori positive.  Placed on triple therapy.  Due to her history of cancer, advanced age and difficulty with care, SNF was recommended by her physicians but declined per her insurance company.  She was sent home and family arranged 24 hour care.  She unfortunately has had progression in her symptoms over the past 5 days with diarrhea, continued abd pain, poor PO intake with nausea and vomiting and is unable to get to the bathroom without crawling.  Was taken to the ER .   Clinical Impression   Pt was admitted for the above.  She will benefit from skilled OT to increase safety and independence with adls with supervision level goals in acute.      OT Assessment  Patient needs continued OT Services    Follow Up Recommendations  SNF    Barriers to Discharge      Equipment Recommendations   (to be further assessed)    Recommendations for Other Services    Frequency  Min 2X/week    Precautions / Restrictions Precautions Precautions: Fall Restrictions Weight Bearing Restrictions: No   Pertinent Vitals/Pain Pt denies pain    ADL  Grooming: Min guard;Teeth care Where Assessed - Grooming: Supported standing (propped forearms on sink) Upper Body Bathing: Set up Where Assessed - Upper Body Bathing: Unsupported sitting Lower Body Bathing: Minimal assistance Where Assessed - Lower Body Bathing: Supported sit to stand Upper Body Dressing: Set up Where Assessed - Upper Body Dressing: Unsupported sitting Lower Body Dressing: Minimal assistance Where Assessed - Lower Body Dressing: Supported sit to stand Toilet Transfer: Simulated;Minimal assistance (chair  to sink, to chair) Toilet Transfer Method: Sit to stand Toileting - Clothing Manipulation and Hygiene: Minimal assistance Where Assessed - Toileting Clothing Manipulation and Hygiene: Sit to stand from 3-in-1 or toilet Transfers/Ambulation Related to ADLs: ambulated 2 feet to sink and back to chair ADL Comments: Pt needs overall min A for balance for adls.      OT Diagnosis: Generalized weakness  OT Problem List: Decreased strength;Decreased activity tolerance;Impaired balance (sitting and/or standing) OT Treatment Interventions: Self-care/ADL training;DME and/or AE instruction;Patient/family education;Balance training   OT Goals(Current goals can be found in the care plan section) Acute Rehab OT Goals Patient Stated Goal: regain strength OT Goal Formulation: With patient Time For Goal Achievement: 05/03/13 Potential to Achieve Goals: Good ADL Goals Pt Will Perform Lower Body Bathing: with supervision;with set-up;sit to/from stand Pt Will Perform Lower Body Dressing: with set-up;with supervision;sit to/from stand Pt Will Transfer to Toilet: with supervision;ambulating;bedside commode Pt Will Perform Toileting - Clothing Manipulation and hygiene: with supervision;sit to/from stand  Visit Information         Prior Functioning     Home Living Family/patient expects to be discharged to:: Private residence Living Arrangements: Alone Available Help at Discharge: Family;Available PRN/intermittently Type of Home: House Home Access: Stairs to enter Home Layout: One level Home Equipment: None Additional Comments: plans to d/c to snf.  Daughter lives nearby but works 12 hour shifts Prior Function Level of Independence: Independent (basic adls; assist for home management) Gait / Transfers Assistance Needed: none, states she does not use assistive device ADL's / Homemaking Assistance Needed: assist with heavier  cleaning, has cleaning lady who comes every two weeks.   Comments: daughter  cooks meals and puts them in the refridgerator.   Communication Communication: HOH Dominant Hand: Right         Vision/Perception     Cognition  Cognition Arousal/Alertness: Awake/alert Behavior During Therapy: WFL for tasks assessed/performed Overall Cognitive Status: Mostly Within Functional Limits for tasks assessed. H/O dementia.  Min cues given for safety    Extremity/Trunk Assessment Upper Extremity Assessment Upper Extremity Assessment: Generalized weakness     Mobility  Transfers Sit to Stand: 4: Min guard;With upper extremity assist;From chair/3-in-1 Stand to Sit: 4: Min guard;With upper extremity assist;To toilet;To chair/3-in-1 Details for Transfer Assistance: cues for safety, body position before sitting and hand placement     Exercise     Balance Balance Balance Assessed: Yes Static Standing Balance Static Standing - Balance Support: During functional activity Static Standing - Level of Assistance: 5: Stand by assistance Static Standing - Comment/# of Minutes: min/guard to supervision standing/leaning forward at sink for teeth    End of Session OT - End of Session Activity Tolerance: Patient tolerated treatment well Patient left: in chair;with call bell/phone within reach Nurse Communication:  (RN aware pt sitting in chair)  GO     Ebrahim Deremer 04/19/2013, 4:39 PM Marica Otter, OTR/L [phone] 04/19/2013

## 2013-04-19 NOTE — Evaluation (Signed)
Physical Therapy Evaluation Patient Details Name: [name] MRN: [id number] DOB: 04-08-1923 Today's Date: 04/19/2013 Time: 6213-0865 PT Time Calculation (min): 19 min  PT Assessment / Plan / Recommendation History of Present Illness  79 year old female who was hospitalized last week for abd pain and found to be H. Pylori positive.  Placed on triple therapy.  Due to her history of cancer, advanced age and difficulty with care, SNF was recommended by her physicians but declined per her insurance company.  She was sent home and family arranged 24 hour care.  She unfortunately has had progression in her symptoms over the past 5 days with diarrhea, continued abd pain, poor PO intake with nausea and vomiting and is unable to get to the bathroom without crawling.  Was taken to the ER for evaluation and found to be in considerable distress.  I was asked to admit her.  Family is not happy that the SNF was denied on her last OV and she feels that she was "sent home too early" despite the need for some form of disposition.  Clinical Impression  Pt admitted with FTT. Pt currently with functional limitations due to the deficits listed below (see PT Problem List).  Pt will benefit from skilled PT to increase their independence and safety with mobility to allow discharge to the venue listed below.  Pt with presents with generalized weakness and decreased endurance limiting mobility at this time.     PT Assessment  Patient needs continued PT services    Follow Up Recommendations  SNF    Does the patient have the potential to tolerate intense rehabilitation      Barriers to Discharge        Equipment Recommendations  None recommended by PT    Recommendations for Other Services     Frequency Min 3X/week    Precautions / Restrictions Precautions Precautions: Fall   Pertinent Vitals/Pain n/a     Mobility  Bed Mobility Bed Mobility: Supine to Sit Supine to Sit: 6: Modified independent  (Device/Increase time) Transfers Transfers: Sit to Stand;Stand to Sit Sit to Stand: 4: Min guard;With upper extremity assist;From toilet;From bed Stand to Sit: 4: Min guard;With upper extremity assist;To toilet;To chair/3-in-1 Details for Transfer Assistance: verbal cues for hand placement to assist with steadying, min/guard for safety Ambulation/Gait Ambulation/Gait Assistance: 4: Min guard Ambulation Distance (Feet): 16 Feet Assistive device: Rolling walker Ambulation/Gait Assistance Details: IV pole into bathroom however pt very unsteady and reaching out for wall/door/etc with free hand so used RW upon return to recliner, limited to room ambulation due to fatigue and weakness Gait Pattern: Step-through pattern;Trunk flexed;Decreased stride length    Exercises     PT Diagnosis: Generalized weakness;Difficulty walking  PT Problem List: Decreased strength;Decreased activity tolerance;Decreased balance;Decreased mobility;Decreased knowledge of use of DME PT Treatment Interventions: DME instruction;Gait training;Functional mobility training;Therapeutic activities;Therapeutic exercise;Patient/family education;Neuromuscular re-education;Balance training     PT Goals(Current goals can be found in the care plan section) Acute Rehab PT Goals Patient Stated Goal: regain strength PT Goal Formulation: With patient Time For Goal Achievement: 05/03/13 Potential to Achieve Goals: Good  Visit Information  Last PT Received On: 04/19/13 Assistance Needed: +1 History of Present Illness: 79 year old female who was hospitalized last week for abd pain and found to be H. Pylori positive.  Placed on triple therapy.  Due to her history of cancer, advanced age and difficulty with care, SNF was recommended by her physicians but declined per her insurance company.  She was sent home and family arranged 24 hour care.  She unfortunately has had progression in her symptoms over the past 5 days with diarrhea,  continued abd pain, poor PO intake with nausea and vomiting and is unable to get to the bathroom without crawling.  Was taken to the ER for evaluation and found to be in considerable distress.  I was asked to admit her.  Family is not happy that the SNF was denied on her last OV and she feels that she was "sent home too early" despite the need for some form of disposition.       Prior Functioning  Home Living Family/patient expects to be discharged to:: Private residence Living Arrangements: Alone Available Help at Discharge: Family;Available PRN/intermittently Type of Home: House Home Access: Stairs to enter Home Layout: One level Home Equipment: None Additional Comments: Plans to d/c to SNF Prior Function Level of Independence: Needs assistance Gait / Transfers Assistance Needed: none, states she does not use assistive device ADL's / Homemaking Assistance Needed: assist with heavier cleaning, has cleaning lady who comes every two weeks.   Comments: daughter cooks meals and puts them in the refridgerator.   Communication Communication: HOH    Cognition  Cognition Arousal/Alertness: Awake/alert Behavior During Therapy: WFL for tasks assessed/performed Overall Cognitive Status: Within Functional Limits for tasks assessed    Extremity/Trunk Assessment Upper Extremity Assessment Upper Extremity Assessment: Defer to OT evaluation Lower Extremity Assessment Lower Extremity Assessment: Generalized weakness (bil hip weakness grossly 4-/5)   Balance Balance Balance Assessed: Yes Static Standing Balance Static Standing - Balance Support: During functional activity Static Standing - Level of Assistance: 5: Stand by assistance Static Standing - Comment/# of Minutes: min/guard to supervision standing at sink washing hands, pt had LOB backwards however able to self correct  End of Session PT - End of Session Activity Tolerance: Patient limited by fatigue Patient left: in chair;Other  (comment) (with OT)  GP     Korinna Tat,KATHrine E 04/19/2013, 3:04 PM Zenovia Jarred, PT, DPT 04/19/2013 Pager: [phone]

## 2013-04-19 NOTE — Progress Notes (Signed)
CSW spoke with Malka So to follow up. Pt daughter thanked csw for concern and advocating for patient. Pt daughter and csw discussed that patient may be ready for discharge as early as today, but at this time further medical evaluation was pending. Patient daughter and csw discussed looking at [facility] facilities to look into all options incase [facility] was not available when patient is ready for discharge. Patient daughter thanked csw and agreed to [employer]. Patient daughter also understanding that pt insurance pending physical therapy evaluation, and pt daughter aware that private pay for skilled nursing may be needed. Please see placement note for progress of placement.   Catha Gosselin, LCSW [phone]  ED CSW .04/19/2013 827am

## 2013-04-19 NOTE — Progress Notes (Addendum)
Clinical Social Work Department CLINICAL SOCIAL WORK PLACEMENT NOTE 04/19/2013  Patient:  SOTIRIA, KEAST  Account Number:  [id number] Admit date:  04/18/2013  Clinical Social Worker:  Doree Albee  Date/time:  04/19/2013 08:30 AM  Clinical Social Work is seeking post-discharge placement for this patient at the following level of care:   SKILLED NURSING   (*CSW will update this form in Epic as items are completed)   04/18/2013  Patient/family provided with [facility] Department of Clinical Social Work's list of facilities offering this level of care within the geographic area requested by the patient (or if unable, by the patient's family).  04/18/2013  Patient/family informed of their freedom to choose among providers that offer the needed level of care, that participate in Medicare, Medicaid or managed care program needed by the patient, have an available bed and are willing to accept the patient.  04/18/2013  Patient/family informed of [facility]' ownership interest in [facility], as well as of the fact that they are under no obligation to receive care at this facility.  PASARR submitted to EDS on 04/18/2013 PASARR number received from EDS on 04/18/2013  FL2 transmitted to all facilities in geographic area requested by pt/family on  04/19/2013 FL2 transmitted to all facilities within larger geographic area on   Patient informed that his/her managed care company has contracts with or will negotiate with  certain facilities, including the following:     Patient/family informed of bed offers received:  04/19/2013 Patient chooses bed at [facility] Physician recommends and patient chooses bed at    Patient to be transferred to  on  [facility] on  Patient to be transferred to facility by   The following physician request were entered in Epic:   Additional Comments: Pt first choice is [facility] but agreed  to [employer] for skilled nursing placement.  Catha Gosselin, LCSW [phone]  ED CSW .04/19/2013 831am    Jacklynn Lewis, MSW, LCSWA  Clinical Social Work [phone]

## 2013-04-19 NOTE — Progress Notes (Signed)
[facility]  Patient Status: Active (receiving services up to time of hospitalization)  [facility] is providing the following services: RN, PT, OT and HHA  If patient discharges after hours, please call [phone].   [name] 04/19/2013, 9:55 AM

## 2013-04-19 NOTE — Progress Notes (Signed)
Subjective: Had a fair night. No abd pain. Ate some soup. One minor loose stool this AM. No f/c/s No SOB or BP   Objective: Vital signs in last 24 hours: Temp:  [97.7 F (36.5 C)-98.5 F (36.9 C)] 97.7 F (36.5 C) (12/04 0521) Pulse Rate:  [76-109] 76 (12/04 0521) Resp:  [16-20] 18 (12/04 0521) BP: (128-166)/(73-96) 159/81 mmHg (12/04 0521) SpO2:  [90 %-100 %] 94 % (12/04 0521) Weight:  [37.83 kg (83 lb 6.4 oz)] 37.83 kg (83 lb 6.4 oz) (12/03 1623)  Intake/Output from previous day: 12/03 0701 - 12/04 0700 In: 120 [P.O.:120] Out: -  Intake/Output this shift:    Thin frail in no distress. Anicteric, oral membranes moist. Neck supple. Lungs clear ht sl irreg abd scaphoid soft NT, thin extrems. Alert, mentating a bit better  Lab Results   Recent Labs  04/18/13 1111 04/19/13 0412  WBC 6.3 6.7  RBC 4.12 3.76*  HGB 11.9* 10.8*  HCT 34.5* 31.7*  MCV 83.7 84.3  MCH 28.9 28.7  RDW 17.1* 17.3*  PLT 436* 403*    Recent Labs  04/18/13 1111 04/19/13 0412  NA 134* 134*  K 4.6 3.7  CL 99 102  CO2 27 25  GLUCOSE 104* 78  BUN 26* 14  CREATININE 0.84 0.70  CALCIUM 10.4 9.2    Studies/Results: No results found.  Scheduled Meds: . clarithromycin  250 mg Oral Q12H  . digoxin  125 mcg Oral Daily  . donepezil  10 mg Oral QHS  . enoxaparin (LOVENOX) injection  30 mg Subcutaneous Q24H  . metroNIDAZOLE  500 mg Oral Q8H  . multivitamin-prenatal  1 tablet Oral Daily  . pantoprazole  40 mg Oral BID  . potassium chloride SA  20 mEq Oral Daily  . pregabalin  100 mg Oral QHS   Continuous Infusions: . sodium chloride 75 mL/hr at 04/19/13 0616   PRN Meds:acetaminophen, acetaminophen, bismuth subsalicylate, HYDROcodone-acetaminophen, ondansetron (ZOFRAN) IV, ondansetron, zolpidem  Assessment/Plan:   Failure to thrive-  Clearly a difficult issue.  H. Pylori + Continue Triple therapy. Had imaging/EGD per Randa Evens last week. Pepto as needed for symptoms.  Diarrhea- C diff pending  (doubt with nl WBC) Prerenal azotemia- IV fluids  Hyponatremia- mild,  Lymphoma : no evidence of return Hyperlipidemia  HTN- Controlled ANEMIA: mild   LOS: 1 day   [name] [name] 04/19/2013, 7:50 AM

## 2013-04-19 NOTE — Progress Notes (Signed)
Clinical Social Work Department BRIEF PSYCHOSOCIAL ASSESSMENT 04/19/2013  Patient:  [name], [name]     Account Number:  [id number]     Admit date:  04/18/2013  Clinical Social Worker:  Doree Albee  Date/Time:  04/18/2013 02:57 PM  Referred by:  Physician  Date Referred:  04/18/2013 Referred for  SNF Placement   Other Referral:   Interview type:  Patient Other interview type:   and patient daughter    PSYCHOSOCIAL DATA Living Status:  ALONE Admitted from facility:   Level of care:   Primary support name:  [name] Primary support relationship to patient:  CHILD, ADULT Degree of support available:   strong    CURRENT CONCERNS Current Concerns  Post-Acute Placement   Other Concerns:    SOCIAL WORK ASSESSMENT / PLAN CSW met wtih pt at bedside, along with pt daughter. Pt and pt daughter shared that patient was discharged home last Friday, after pt recommended home health as patient could walk up and down the hallway. Patient daughter shared that patient had become very weak, was not drinking or eating and was only able to crawl to the bathroom.    Pt and patient daughter are interested in going to [facility]. Pt and pt daughter verbalized understanding that insurance may or may not cover skilled nursing depending upon physical therapy recommendations. Pt and patient daughter verbalized that they can pay privately for skilled nursing if necessary.    Pt and pt daughter gave csw permission to send patient information to [facility]. Patient had been in [facility] this past July. CSW compelted fl2 for MD signature and spoke wtih Janie at [facility] to assist wtih disposition in case patient returned home or was admitted to the hosptial.   Assessment/plan status:  Psychosocial Support/Ongoing Assessment of Needs Other assessment/ plan:   Information/referral to community resources:   Pt and pt daughter already provided with snf list and private duty care agency  list and declined [employer].    PATIENT'S/FAMILY'S RESPONSE TO PLAN OF CARE: Pt and pt daugher thanked csw for concern and support. Pt daughter thanked csw for advocating for patient. Pt and pt daughter are motivated to assist with pt disposition needs in hoping to have patient placed at [facility] for skilled nursing.      Catha Gosselin, [place] [phone]  ED CSW 04/18/2013 1457pm

## 2013-04-19 NOTE — Progress Notes (Addendum)
   CARE MANAGEMENT NOTE 04/19/2013  Patient:  [name], [name]   Account Number:  [id number]  Date Initiated:  04/19/2013  Documentation initiated by:  [facility]  Subjective/Objective Assessment:   cdiff, H-pylori     Action/Plan:   lives at home with family, scheduled SNF placement   Anticipated DC Date:  04/19/2013   Anticipated DC Plan:  HOME W HOME HEALTH SERVICES      DC Planning Services  CM consult      Choice offered to / List presented to:             Status of service:  Completed, signed off Medicare Important Message given?   (If response is "NO", the following Medicare IM given date fields will be blank) Date Medicare IM given:   Date Additional Medicare IM given:    Discharge Disposition:  SKILLED NURSING FACILITY  Per UR Regulation:    If discussed at Long Length of Stay Meetings, dates discussed:    Comments:  12/4/014 1400 CSW working toward SNF placement. Pt is active with [facility].  Isidoro Donning RN CCM Case Mgmt phone [phone]

## 2013-04-19 NOTE — Progress Notes (Signed)
CSW met with pt at bedside and provided SNF bed offers.   Pt agreeable to CSW contacting pt daughter to discuss.   CSW contacted pt daughter via telephone. CSW provided SNF bed offers. Pt daughter concerned about if pt insurance will cover placement. CSW notified pt daughter that CSW submitted clinicals to pt insurance company and will follow up with insurance tomorrow morning in order to determine if pt insurance will authorize for SNF. Per PT evaluation, PT recommends SNF.  CSW clarified pt questions about placement re: difference between short term and long term placement.   CSW to follow up with pt insurance in the morning to determine if pt insurance will cover SNF or if pt family will pay out of pocket for placement. CSW to follow up with pt and pt daughter tomorrow re: decision for SNF.  CSW to continue to follow and facilitate pt discharge needs when pt medically ready for discharge.  Jacklynn Lewis, MSW, LCSWA  Clinical Social Work [phone]

## 2013-04-20 LAB — COMPREHENSIVE METABOLIC PANEL
Albumin: 2.6 g/dL — ABNORMAL LOW (ref 3.5–5.2)
BUN: 8 mg/dL (ref 6–23)
Creatinine, Ser: 0.62 mg/dL (ref 0.50–1.10)
Total Protein: 6.5 g/dL (ref 6.0–8.3)

## 2013-04-20 LAB — CBC
HCT: 34.8 % — ABNORMAL LOW (ref 36.0–46.0)
Hemoglobin: 12.1 g/dL (ref 12.0–15.0)
MCV: 83.7 fL (ref 78.0–100.0)
RBC: 4.16 MIL/uL (ref 3.87–5.11)
WBC: 7.6 10*3/uL (ref 4.0–10.5)

## 2013-04-20 NOTE — Progress Notes (Signed)
Subjective: Had a good night. No diarrhea. No N/V. abd feels a bit better No CP or SOB. Ate a little yesterday   Objective: Vital signs in last 24 hours: Temp:  [97.6 F (36.4 C)-98.4 F (36.9 C)] 98 F (36.7 C) (12/05 0548) Pulse Rate:  [84-94] 94 (12/05 0548) Resp:  [16-20] 18 (12/05 0548) BP: (153-187)/(72-100) 154/89 mmHg (12/05 0548) SpO2:  [94 %-97 %] 94 % (12/05 0548)  Intake/Output from previous day: 12/04 0701 - 12/05 0700 In: 3341.3 [P.O.:600; I.V.:2741.3] Out: -  Intake/Output this shift:    Sitting up in no distress. Face symmetric, lungs clear no wheeze, ht IR IR abd scaphoid soft NT extrems thin no edema. Awake, alert  Lab Results   Recent Labs  04/19/13 0412 04/20/13 0420  WBC 6.7 7.6  RBC 3.76* 4.16  HGB 10.8* 12.1  HCT 31.7* 34.8*  MCV 84.3 83.7  MCH 28.7 29.1  RDW 17.3* 17.5*  PLT 403* 471*    Recent Labs  04/19/13 0412 04/20/13 0420  NA 134* 130*  K 3.7 4.0  CL 102 97  CO2 25 26  GLUCOSE 78 76  BUN 14 8  CREATININE 0.70 0.62  CALCIUM 9.2 9.0    Studies/Results: No results found.  Scheduled Meds: . clarithromycin  250 mg Oral Q12H  . digoxin  125 mcg Oral Daily  . donepezil  10 mg Oral QHS  . enoxaparin (LOVENOX) injection  30 mg Subcutaneous Q24H  . feeding supplement (ENSURE COMPLETE)  237 mL Oral BID BM  . metroNIDAZOLE  500 mg Oral Q8H  . multivitamin-prenatal  1 tablet Oral Daily  . pantoprazole  40 mg Oral BID  . potassium chloride SA  20 mEq Oral Daily  . pregabalin  100 mg Oral QHS   Continuous Infusions: . sodium chloride 75 mL/hr at 04/19/13 2052   PRN Meds:acetaminophen, acetaminophen, bismuth subsalicylate, HYDROcodone-acetaminophen, ondansetron (ZOFRAN) IV, ondansetron, zolpidem  Assessment/Plan: Failure to thrive- Clearly a difficult issue. But sl better  H. Pylori + Continue Triple therapy. Had imaging/EGD per Randa Evens last week. Pepto as needed for symptoms. Tolerating Rx mod well Diarrhea- C diff pending  (doubt with nl WBC) . No more diarrhea Prerenal azotemia- IV fluids , sl better Hyponatremia- mild, sl worse at 130 Lymphoma : no evidence of return  Hyperlipidemia  HTN- Controlled  ANEMIA: mild DISP: to SNF     LOS: 2 days   [name] [name] 04/20/2013, 8:29 AM

## 2013-04-20 NOTE — Progress Notes (Signed)
Physical Therapy Treatment Patient Details Name: [name] MRN: [id number] DOB: [birth year] Today's Date: 04/20/2013 Time: 9629-5284 PT Time Calculation (min): 29 min  PT Assessment / Plan / Recommendation  History of Present Illness 79 year old female who was hospitalized last week for abd pain and found to be H. Pylori positive.  Placed on triple therapy.  Due to her history of cancer, advanced age and difficulty with care, SNF was recommended by her physicians but declined per her insurance company.  She was sent home and family arranged 24 hour care.  She unfortunately has had progression in her symptoms over the past 5 days with diarrhea, continued abd pain, poor PO intake with nausea and vomiting and is unable to get to the bathroom without crawling.  Was taken to the ER for evaluation and found to be in considerable distress.  I was asked to admit her.  Family is not happy that the SNF was denied on her last OV and she feels that she was "sent home too early" despite the need for some form of disposition.   PT Comments   Pt assisted to toilet and then ambulated in hallway using RW to improve stability.    Follow Up Recommendations  SNF     Does the patient have the potential to tolerate intense rehabilitation     Barriers to Discharge        Equipment Recommendations  None recommended by PT    Recommendations for Other Services    Frequency Min 3X/week   Progress towards PT Goals Progress towards PT goals: Progressing toward goals  Plan Current plan remains appropriate    Precautions / Restrictions Precautions Precautions: Fall   Pertinent Vitals/Pain n/a    Mobility  Bed Mobility Bed Mobility: Supine to Sit Supine to Sit: 6: Modified independent (Device/Increase time) Transfers Transfers: Sit to Stand;Stand to Sit Sit to Stand: 4: Min guard;With upper extremity assist;From toilet;From bed Stand to Sit: 4: Min guard;With upper extremity assist;To toilet;To  chair/3-in-1 Details for Transfer Assistance: verbal cues for hand placement to assist with steadying, min/guard for safety Ambulation/Gait Ambulation/Gait Assistance: 4: Min guard Ambulation Distance (Feet): 100 Feet Assistive device: Rolling walker Ambulation/Gait Assistance Details: Pt used IV pole into bathroom due to urgency however utilized RW in hallway for stability and safety, verbal cues for RW distance and posture Gait Pattern: Step-through pattern;Trunk flexed;Decreased stride length    Exercises General Exercises - Lower Extremity Long Arc Quad: AROM;Seated;15 reps;Both Hip Flexion/Marching: AROM;Seated;Both;15 reps   PT Diagnosis:    PT Problem List:   PT Treatment Interventions:     PT Goals (current goals can now be found in the care plan section)    Visit Information  Last PT Received On: 04/20/13 Assistance Needed: +1 History of Present Illness: 79 year old female who was hospitalized last week for abd pain and found to be H. Pylori positive.  Placed on triple therapy.  Due to her history of cancer, advanced age and difficulty with care, SNF was recommended by her physicians but declined per her insurance company.  She was sent home and family arranged 24 hour care.  She unfortunately has had progression in her symptoms over the past 5 days with diarrhea, continued abd pain, poor PO intake with nausea and vomiting and is unable to get to the bathroom without crawling.  Was taken to the ER for evaluation and found to be in considerable distress.  I was asked to admit her.  Family is  not happy that the SNF was denied on her last OV and she feels that she was "sent home too early" despite the need for some form of disposition.    Subjective Data      Cognition  Cognition Arousal/Alertness: Awake/alert Behavior During Therapy: WFL for tasks assessed/performed Overall Cognitive Status: Within Functional Limits for tasks assessed    Balance     End of Session PT - End  of Session Activity Tolerance: Patient limited by fatigue Patient left: in chair;with call bell/phone within reach   GP     [facility] E 04/20/2013, 12:13 PM Zenovia Jarred, PT, DPT 04/20/2013 Pager: [phone]

## 2013-04-20 NOTE — Progress Notes (Addendum)
CSW continuing to follow for discharge planning.   CSW spoke with MD who stated that pt is not medically ready for discharge today, but there is a potential that pt may be medically ready for discharge during the weekend.   CSW received phone call from [facility] caseworker stating that insurance has approved pt for admission to SNF when pt medically ready for discharge.  CSW spoke with pt at bedside re: disposition planning.   Pt requested CSW contact pt daughter via telephone as pt was deferring decisions to pt daughter re: placement. CSW did share with pt that pt insurance approved SNF placement. Pt expressed relief.  CSW spoke with pt daughter via telephone. Pt daughter stated that she is interested in [facility] or [facility]. Pt daughter requested CSW to inquire about if either facility had private room availability. CSW contacted both [facility] and [facility] and both facilities have private room availability. CSW notified pt daughter. Pt daughter decided on [facility].   CSW notified [employer] caseworker of pt decision for [facility] and insurance planned to provide facility insurance authorization in order for pt to discharge during the weekend if MD feels pt medically ready during the weekend.   Pt daughter plans to complete admission paperwork at [facility] today in order for pt to admit during the weekend if medically ready.   CSW to notify MD that pt can be accepted to [facility] during the weekend if needed.  Weekend CSW to facilitate pt discharge needs during the weekend if MD feels pt medically ready for discharge.  CSW to continue to follow.  Addendum:  CSW left message with MD nurse to notify that everything was arranged for SNF at [facility] if pt becomes medically ready for discharge during the weekend.  CSW discussed with pt at bedside and pt was relieved to know she would have a private room at [facility].  CSW to continue to  follow and facilitate pt discharge needs when pt medically ready for discharge.  Jacklynn Lewis, MSW, LCSWA  Clinical Social Work [phone]

## 2013-04-21 LAB — COMPREHENSIVE METABOLIC PANEL
AST: 29 U/L (ref 0–37)
Albumin: 2.3 g/dL — ABNORMAL LOW (ref 3.5–5.2)
Alkaline Phosphatase: 48 U/L (ref 39–117)
BUN: 6 mg/dL (ref 6–23)
CO2: 26 mEq/L (ref 19–32)
Chloride: 95 mEq/L — ABNORMAL LOW (ref 96–112)
Creatinine, Ser: 0.61 mg/dL (ref 0.50–1.10)
GFR calc non Af Amer: 79 mL/min — ABNORMAL LOW (ref >90)
Glucose, Bld: 69 mg/dL — ABNORMAL LOW (ref 70–99)
Potassium: 3.6 mEq/L (ref 3.5–5.1)
Sodium: 130 mEq/L — ABNORMAL LOW (ref 135–145)
Total Bilirubin: 0.4 mg/dL (ref 0.3–1.2)

## 2013-04-21 MED ORDER — BISMUTH SUBSALICYLATE 262 MG PO CHEW: 524.0000 mg | CHEWABLE_TABLET | ORAL | Status: DC | PRN

## 2013-04-21 NOTE — Discharge Summary (Signed)
DISCHARGE SUMMARY  CARAMIA BOUTIN  MR#: [id number]  DOB:Oct 09, 1922  Date of Admission: 04/18/2013 Date of Discharge: 04/21/2013  Attending Physician:Jontae Sonier [name]  Patient's OZH:[id number] Hessie Diener, MD  Consults:   none  Discharge Diagnoses: Active Problems:   Failure to thrive  severe H. Pylori Abdominal pain, improved Mild cognitive impairment Hyperlipidemia Atrial fibrillation, to go back Xarelto after H. pylori treatment is completed Hypertension History non-Hodgkin's lymphoma Protein calorie malnutrition Osteoporosis COPD Goiter Neuropathy Hyponatremia, mild Anemia, resolved    Discharge Medications:   Medication List         bismuth subsalicylate 262 MG chewable tablet  Commonly known as:  PEPTO BISMOL  Chew 2 tablets (524 mg total) by mouth every hour as needed for diarrhea or loose stools.     clarithromycin 250 MG tablet  Commonly known as:  BIAXIN  Take 1 tab every twelve hours. Last dosage in evening of 12/10     digoxin 0.125 MG tablet  Commonly known as:  LANOXIN  Take 125 mcg by mouth daily.     donepezil 10 MG tablet  Commonly known as:  ARICEPT  Take 10 mg by mouth at bedtime.     fenofibrate 160 MG tablet  Take 160 mg by mouth.     fish oil-omega-3 fatty acids 1000 MG capsule  Take 1 g by mouth daily.     lansoprazole 15 MG capsule  Commonly known as:  PREVACID  Take 1 po BID until 12/10, then resume taking 1 tab per day     LYRICA 100 MG capsule  Generic drug:  pregabalin  Take 100 mg by mouth at bedtime.     metroNIDAZOLE 500 MG tablet  Commonly known as:  FLAGYL  Take 1 tab by mouth twice daily with last dose in the evening on 04/25/13     multivitamin-prenatal 27-0.8 MG Tabs tablet  Take 1 tablet by mouth daily.     oxyCODONE-acetaminophen 5-325 MG per tablet  Commonly known as:  PERCOCET/ROXICET  Take 1 tablet by mouth every 6 (six) hours as needed for pain.     potassium chloride SA 20 MEQ tablet  Commonly  known as:  K-DUR,KLOR-CON  Take 1 tab twice per day     VITAMIN D PO  Take 1 tablet by mouth daily.        Hospital Procedures: Ct Abdomen Pelvis Wo Contrast  04/02/2013   CLINICAL DATA:  Abdominal pain and nausea.  EXAM: CT ABDOMEN AND PELVIS WITHOUT CONTRAST  TECHNIQUE: Multidetector CT imaging of the abdomen and pelvis was performed following the standard protocol without intravenous contrast.  COMPARISON:  06/04/2009  FINDINGS: The lung bases demonstrate multiple bilateral pulmonary nodules, peribronchial thickening and tree-in-bud appearance likely reflecting a chronic inflammatory process or atypical infection. No pleural effusion.  The unenhanced appearance of the liver is grossly normal. The gallbladder is unremarkable. No common bile duct dilatation. The pancreas is grossly normal. The spleen is surgically absent. The adrenal glands and kidneys are grossly normal. No obvious ureteral or bladder calculi.  The stomach moderate wall thickening in the body region with thickened appearing gastric folds. Could not exclude an inflammatory or infiltrative process. Upper endoscopy may be helpful for further evaluation. The duodenum, small bowel and colon are grossly normal. No obvious inflammatory changes, mass lesions or obstructive findings. The appendix is surgically absent. No mesenteric or retroperitoneal mass or adenopathy. Advanced atherosclerotic calcifications involving the aorta and branch vessels.  The uterus is surgically absent. The bladder is unremarkable.  No pelvic mass or adenopathy. No significant free pelvic fluid collections. No inguinal mass or adenopathy.  The lumbar spine demonstrates stable Schmorl's nodes, Osteo poor Oasis and compression fractures.  IMPRESSION: Persistent chronic inflammatory changes involving the lung bases.  No acute abdominal/ pelvic findings, mass lesions or adenopathy.  Apparent wall thickening in the body region of the stomach. Could not exclude a  inflammatory or infiltrative process. Upper endoscopy may be helpful for further evaluation.   Electronically Signed   By: Loralie Champagne M.D.   On: 04/02/2013 16:20   US Abdomen Complete  04/02/2013   CLINICAL DATA:  Upper abdominal pain.  Nausea.  EXAM: ULTRASOUND ABDOMEN COMPLETE  COMPARISON:  CT, 05/1909  FINDINGS: Gallbladder  No gallstones or wall thickening visualized. No sonographic Murphy sign noted.  Common bile duct  Diameter: 3 mm.  Liver  No focal lesion identified. Within normal limits in parenchymal echogenicity.  IVC  Within normal limits  Pancreas  Incomplete visualization.  Visualized portions are unremarkable.  Spleen  Size and appearance within normal limits.  Right Kidney  Length: 8.8 cm. Increased renal parenchymal echogenicity. No mass, stone or hydronephrosis.  Left Kidney  Length: 9.9 cm. Increased renal parenchymal echogenicity. No mass, stone or hydronephrosis.  Abdominal aorta  Atherosclerotic calcification and irregularity.  No aneurysm.  Small amount of ascites adjacent to the liver.  IMPRESSION: 1. No acute findings. Normal gallbladder with no bile duct dilation. 2. Echogenic kidneys consistent with medical renal disease. 3. Incomplete visualization of pancreas. 4. Small amount of ascites.   Electronically Signed   By: Amie Portland M.D.   On: 04/02/2013 12:36   Dg Abd Acute W/chest  04/02/2013   CLINICAL DATA:  Pain.  Weakness.  EXAM: ACUTE ABDOMEN SERIES (ABDOMEN 2 VIEW & CHEST 1 VIEW)  COMPARISON:  12/07/2012.  FINDINGS: Mediastinum and hilar structures normal. Chronic interstitial prominence noted consistent with interstitial fibrosis. Changes suggesting COPD. Stable nodularity suggests the possibility of prior granulomatous disease. No pneumothorax. Cardiomegaly, no CHF.  Surgical clips left upper quadrant. Gas pattern is nonspecific with stool throughout the colon. No free air. Severe thoracolumbar and bilateral hip degenerative change. Surgical clips left lower pelvis.  Aortoiliac atherosclerotic vascular disease.  IMPRESSION: 1. No acute abdominal abnormality identified. 2. Chronic interstitial lung disease and COPD.   Electronically Signed   By: Maisie Fus  Register   On: 04/02/2013 11:27    History of Present Illness: Worsening abdominal pain  Hospital Course: This is an 79 year old white female recently had the hospital with findings of severe concentric H. pylori. This had caused failure to thrive weight loss and abdominal pain. She is started on medications but had been worsening with progressive failure to thrive and was brought into for evaluation and treatment. Additionally she had some loose stools probably due to the medications. As part of her H. pylori therapy however she started on metronidazole which should take care of any possibility of Clostridium difficile. We've been unable to get a culture while here. Now she is tolerating diet a bit better. We have put much weight back on. Her abdominal pain is quite a bit better. She is breathing well. She's had no cardiac symptoms while here. Her blood pressure is been fair. She's had no fevers chills or sweats. Her planning on a skilled nursing facility stay in an attempt to reverse his failure to thrive issue.  Day of Discharge Exam BP 161/91  Pulse 80  Temp(Src) 98 F (36.7 C) (Oral)  Resp 18  Ht 5\' 4"  (1.626 m)  Wt 37.83 kg (83 lb 6.4 oz)  BMI 14.31 kg/m2  SpO2 94%  Physical Exam: General appearance: Thin white female sitting up in no distress. Face is symmetric. Sclera anicteric. Obliques murmurs are moist. Neck is supple without bruits. Lungs are clear without wheezes rales or rhonchi. No accessory muscles are in use. Heart is irregularly irregular with systolic murmur. Abdomen scaphoid soft nontender. i can feel suture granulomas under the midline. No masses are present. Bowel sounds are good. Extremities: Thin with no edema. Pulses are intact. no clubbing, cyanosis or edema Neuro: Patient's awake  alert and mentating fairly well today. She is in good spirits. She recognizes me easily. Speech is clear and fluent. No resting tremor is present.  Discharge Labs:  Recent Labs  04/20/13 0420 04/21/13 0419  NA 130* 130*  K 4.0 3.6  CL 97 95*  CO2 26 26  GLUCOSE 76 69*  BUN 8 6  CREATININE 0.62 0.61  CALCIUM 9.0 8.3*    Recent Labs  04/20/13 0420 04/21/13 0419  AST 31 29  ALT 11 10  ALKPHOS 53 48  BILITOT 0.4 0.4  PROT 6.5 5.7*  ALBUMIN 2.6* 2.3*    Recent Labs  04/18/13 1111 04/19/13 0412 04/20/13 0420  WBC 6.3 6.7 7.6  NEUTROABS 3.2  --   --   HGB 11.9* 10.8* 12.1  HCT 34.5* 31.7* 34.8*  MCV 83.7 84.3 83.7  PLT 436* 403* 471*        Discharge instructions:  Regular diet with supplements   Disposition: To skilled care  Follow-up Appts: Follow-up with Dr. Evlyn Kanner at [facility].     Condition on Discharge: improved  Tests Needing Follow-up: none  Signed: Iris Hairston [name] 04/21/2013, 9:57 AM

## 2013-04-21 NOTE — Progress Notes (Signed)
Per MD, Pt ready for d/c.  Notified RN, Pt and facility.  Pt stated that her family is aware; no need to contact them.  Sent d/c summary.  Confirmed receipt of d/c summary.  Facility ready to receive Pt.  Arranged for transportation.  Providence Crosby, LCSWA Clinical Social Work [phone]

## 2013-05-22 ENCOUNTER — Encounter: Payer: Self-pay | Admitting: Neurology

## 2013-05-24 ENCOUNTER — Ambulatory Visit (INDEPENDENT_AMBULATORY_CARE_PROVIDER_SITE_OTHER): Payer: Self-pay | Admitting: Neurology

## 2013-05-24 ENCOUNTER — Telehealth: Payer: Self-pay | Admitting: Neurology

## 2013-05-24 ENCOUNTER — Encounter: Payer: Self-pay | Admitting: Neurology

## 2013-05-24 DIAGNOSIS — R413 Other amnesia: Secondary | ICD-10-CM

## 2013-05-24 NOTE — Telephone Encounter (Signed)
This patient did not show for the appointment today as a revisit. The patient apparently refused to be transported to our office. The patient has a history of dementia.

## 2013-05-25 NOTE — Progress Notes (Signed)
No showed for appointment. The patient refused to come to the office.

## 2013-07-28 ENCOUNTER — Encounter (HOSPITAL_COMMUNITY): Payer: Self-pay | Admitting: Emergency Medicine

## 2013-07-28 ENCOUNTER — Emergency Department (HOSPITAL_COMMUNITY): Payer: Medicare Other

## 2013-07-28 ENCOUNTER — Emergency Department (HOSPITAL_COMMUNITY)
Admission: EM | Admit: 2013-07-28 | Discharge: 2013-07-28 | Disposition: A | Discharge status: Home / Self Care | Payer: Medicare Other | Attending: Emergency Medicine | Admitting: Emergency Medicine

## 2013-07-28 DIAGNOSIS — M51379 Other intervertebral disc degeneration, lumbosacral region without mention of lumbar back pain or lower extremity pain: Secondary | ICD-10-CM | POA: Insufficient documentation

## 2013-07-28 DIAGNOSIS — Z7982 Long term (current) use of aspirin: Secondary | ICD-10-CM | POA: Insufficient documentation

## 2013-07-28 DIAGNOSIS — E785 Hyperlipidemia, unspecified: Secondary | ICD-10-CM | POA: Insufficient documentation

## 2013-07-28 DIAGNOSIS — Z79899 Other long term (current) drug therapy: Secondary | ICD-10-CM | POA: Insufficient documentation

## 2013-07-28 DIAGNOSIS — I10 Essential (primary) hypertension: Secondary | ICD-10-CM | POA: Insufficient documentation

## 2013-07-28 DIAGNOSIS — L02419 Cutaneous abscess of limb, unspecified: Secondary | ICD-10-CM | POA: Insufficient documentation

## 2013-07-28 DIAGNOSIS — J4489 Other specified chronic obstructive pulmonary disease: Secondary | ICD-10-CM | POA: Insufficient documentation

## 2013-07-28 DIAGNOSIS — R21 Rash and other nonspecific skin eruption: Secondary | ICD-10-CM | POA: Insufficient documentation

## 2013-07-28 DIAGNOSIS — J449 Chronic obstructive pulmonary disease, unspecified: Secondary | ICD-10-CM | POA: Insufficient documentation

## 2013-07-28 DIAGNOSIS — L03119 Cellulitis of unspecified part of limb: Secondary | ICD-10-CM

## 2013-07-28 DIAGNOSIS — Z880 Allergy status to penicillin: Secondary | ICD-10-CM | POA: Insufficient documentation

## 2013-07-28 DIAGNOSIS — Z792 Long term (current) use of antibiotics: Secondary | ICD-10-CM | POA: Insufficient documentation

## 2013-07-28 DIAGNOSIS — M5137 Other intervertebral disc degeneration, lumbosacral region: Secondary | ICD-10-CM | POA: Insufficient documentation

## 2013-07-28 DIAGNOSIS — Z87898 Personal history of other specified conditions: Secondary | ICD-10-CM | POA: Insufficient documentation

## 2013-07-28 LAB — COMPREHENSIVE METABOLIC PANEL
ALT: 16 U/L (ref 0–35)
AST: 41 U/L — ABNORMAL HIGH (ref 0–37)
Albumin: 2.9 g/dL — ABNORMAL LOW (ref 3.5–5.2)
Alkaline Phosphatase: 62 U/L (ref 39–117)
BILIRUBIN TOTAL: 0.8 mg/dL (ref 0.3–1.2)
BUN: 26 mg/dL — AB (ref 6–23)
CHLORIDE: 97 meq/L (ref 96–112)
CO2: 27 mEq/L (ref 19–32)
CREATININE: 0.7 mg/dL (ref 0.50–1.10)
Calcium: 10.4 mg/dL (ref 8.4–10.5)
GFR calc non Af Amer: 75 mL/min — ABNORMAL LOW (ref >90)
GFR, EST AFRICAN AMERICAN: 87 mL/min — AB (ref >90)
GLUCOSE: 94 mg/dL (ref 70–99)
POTASSIUM: 4.3 meq/L (ref 3.7–5.3)
Sodium: 135 mEq/L — ABNORMAL LOW (ref 137–147)
Total Protein: 7.2 g/dL (ref 6.0–8.3)

## 2013-07-28 LAB — CBC WITH DIFFERENTIAL/PLATELET
BASOS PCT: 1 % (ref 0–1)
Basophils Absolute: 0.1 10*3/uL (ref 0.0–0.1)
Eosinophils Absolute: 0.1 10*3/uL (ref 0.0–0.7)
Eosinophils Relative: 1 % (ref 0–5)
HEMATOCRIT: 34.4 % — AB (ref 36.0–46.0)
HEMOGLOBIN: 11.7 g/dL — AB (ref 12.0–15.0)
LYMPHS ABS: 1.3 10*3/uL (ref 0.7–4.0)
Lymphocytes Relative: 11 % — ABNORMAL LOW (ref 12–46)
MCH: 29.4 pg (ref 26.0–34.0)
MCHC: 34 g/dL (ref 30.0–36.0)
MCV: 86.4 fL (ref 78.0–100.0)
MONO ABS: 1.6 10*3/uL — AB (ref 0.1–1.0)
MONOS PCT: 14 % — AB (ref 3–12)
NEUTROS ABS: 8.4 10*3/uL — AB (ref 1.7–7.7)
NEUTROS PCT: 74 % (ref 43–77)
Platelets: 355 10*3/uL (ref 150–400)
RBC: 3.98 MIL/uL (ref 3.87–5.11)
RDW: 15.9 % — ABNORMAL HIGH (ref 11.5–15.5)
WBC: 11.4 10*3/uL — ABNORMAL HIGH (ref 4.0–10.5)

## 2013-07-28 LAB — DIGOXIN LEVEL: DIGOXIN LVL: 1.2 ng/mL (ref 0.8–2.0)

## 2013-07-28 MED ORDER — CLINDAMYCIN PHOSPHATE 600 MG/50ML IV SOLN
600.0000 mg | Freq: Once | INTRAVENOUS | Status: AC
  Administered 2013-07-28: 600 mg via INTRAVENOUS
  Filled 2013-07-28: qty 50

## 2013-07-28 MED ORDER — CLINDAMYCIN HCL 300 MG PO CAPS: 300.0000 mg | ORAL_CAPSULE | Freq: Four times a day (QID) | ORAL | Status: DC

## 2013-07-28 MED ORDER — ACETAMINOPHEN 325 MG PO TABS
650.0000 mg | ORAL_TABLET | Freq: Once | ORAL | Status: AC
  Administered 2013-07-28: 650 mg via ORAL
  Filled 2013-07-28: qty 2

## 2013-07-28 NOTE — Discharge Instructions (Signed)
Take clindamycin four times a day for a week.   See your doctor in 2-3 days to recheck.   Return to ER if you have more redness and swelling, fevers.

## 2013-07-28 NOTE — ED Provider Notes (Signed)
CSN: [id number]     Arrival date & time 07/28/13  1912 History   First MD Initiated Contact with Patient 07/28/13 1959     Chief Complaint  Patient presents with  . Leg Pain     (Consider location/radiation/quality/duration/timing/severity/associated sxs/prior Treatment) The history is provided by the patient.  [name] is a 80 y.o. female hx of lymphoma, HL, COPD here presenting with left leg pain and redness. She noticed left leg redness for the last week but got worse in the last 3 days. Also noticed some purulent drainage as well. Denies any fevers or chills and denies any chest pain or shortness of breath or history of DVT.    Past Medical History  Diagnosis Date  . Cancer   . Lymphoma, high grade 07/23/2011  . Benign essential HTN 07/23/2011  . Hyperlipidemia 07/23/2011  . 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 DJD (degenerative joint disease), lumbar 07/23/2011  . Current non-smoker but past smoking history unknown 12/08/12  . COPD (chronic obstructive pulmonary disease)    Past Surgical History  Procedure Laterality Date  . Colon surgery    . Appendectomy    . Abdominal hysterectomy    . Hernia repair    . Tonsillectomy    . Splenectomy    . Shoulder surgery    . Hemorroidectomy    . Esophagogastroduodenoscopy N/A 12/08/2012    Procedure: ESOPHAGOGASTRODUODENOSCOPY (EGD);  Surgeon: Wonda Horner, MD;  Location: [facility];  Service: Endoscopy;  Laterality: N/A;  . Esophagogastroduodenoscopy N/A 04/10/2013    Procedure: ESOPHAGOGASTRODUODENOSCOPY (EGD);  Surgeon: Winfield Cunas., MD;  Location: [facility];  Service: Endoscopy;  Laterality: N/A;   Family History  Problem Relation Age of Onset  . Cancer - Other Mother     liver  . Stroke Father   . Cancer Brother     prostate/colon  . Dementia Brother   . Dementia Brother    History  Substance Use Topics  . Smoking status: Never Smoker   . Smokeless tobacco: Never Used  . Alcohol Use: No   OB  History   Grav Para Term Preterm Abortions TAB SAB Ect Mult Living                 Review of Systems  Skin: Positive for rash.  All other systems reviewed and are negative.      Allergies  Iodine; Penicillins; and Shellfish-derived products  Home Medications   Current Outpatient Rx  Name  Route  Sig  Dispense  Refill  . acetaminophen (TYLENOL) 500 MG tablet   Oral   Take 1,000 mg by mouth every 6 (six) hours as needed for mild pain.         Marland Kitchen aspirin 81 MG tablet   Oral   Take 81 mg by mouth daily.         . Cholecalciferol (VITAMIN D PO)   Oral   Take 1 tablet by mouth daily.         . digoxin (LANOXIN) 0.125 MG tablet   Oral   Take 125 mcg by mouth daily.          Marland Kitchen docusate sodium (COLACE) 100 MG capsule   Oral   Take 100 mg by mouth daily.         Marland Kitchen donepezil (ARICEPT) 10 MG tablet   Oral   Take 10 mg by mouth at bedtime.         Marland Kitchen doxycycline (VIBRA-TABS) 100 MG tablet  Oral   Take 100 mg by mouth 2 (two) times daily.         . fenofibrate 160 MG tablet   Oral   Take 160 mg by mouth.          . fish oil-omega-3 fatty acids 1000 MG capsule   Oral   Take 1 g by mouth daily.         . lansoprazole (PREVACID) 15 MG capsule      Take 1 po BID until 12/10, then resume taking 1 tab per day   60 capsule   0   . LORazepam (ATIVAN) 0.5 MG tablet   Oral   Take 0.25 mg by mouth every 6 (six) hours as needed for anxiety.         Marland Kitchen LYRICA 100 MG capsule   Oral   Take 100 mg by mouth at bedtime.          . potassium chloride SA (K-DUR,KLOR-CON) 20 MEQ tablet      Take 1 tab twice per day   60 tablet   1   . Prenatal Vit-Fe Fumarate-FA (MULTIVITAMIN-PRENATAL) 27-0.8 MG TABS   Oral   Take 1 tablet by mouth daily.         . risperiDONE (RISPERDAL) 0.25 MG tablet   Oral   Take 0.25 mg by mouth at bedtime.         Marland Kitchen oxyCODONE-acetaminophen (PERCOCET/ROXICET) 5-325 MG per tablet   Oral   Take 1 tablet by mouth every 6  (six) hours as needed for pain.          BP 174/77  Pulse 89  Temp(Src) 99 F (37.2 C) (Oral)  Resp 14  SpO2 96% Physical Exam  Nursing note and vitals reviewed. Constitutional: She is oriented to person, place, and time.  Chronically ill, slightly uncomfortable   HENT:  Head: Normocephalic.  Eyes: Conjunctivae are normal. Pupils are equal, round, and reactive to light.  Neck: Normal range of motion. Neck supple.  Cardiovascular: Normal rate, regular rhythm and normal heart sounds.   Pulmonary/Chest: Effort normal and breath sounds normal. No respiratory distress. She has no wheezes. She has no rales.  Abdominal: Soft. Bowel sounds are normal. She exhibits no distension. There is no tenderness. There is no rebound and no guarding.  Musculoskeletal: Normal range of motion. She exhibits no edema.  Neurological: She is alert and oriented to person, place, and time.  Skin:  L tib/fib with redness anteriorly with some purulent drainage, no obvious subcutaneous air. No calf tenderness   Psychiatric: She has a normal mood and affect. Her behavior is normal. Judgment and thought content normal.    ED Course  Procedures (including critical care time) Labs Review Labs Reviewed  CBC WITH DIFFERENTIAL - Abnormal; Notable for the following:    WBC 11.4 (*)    Hemoglobin 11.7 (*)    HCT 34.4 (*)    RDW 15.9 (*)    Neutro Abs 8.4 (*)    Lymphocytes Relative 11 (*)    Monocytes Relative 14 (*)    Monocytes Absolute 1.6 (*)    All other components within normal limits  COMPREHENSIVE METABOLIC PANEL - Abnormal; Notable for the following:    Sodium 135 (*)    BUN 26 (*)    Albumin 2.9 (*)    AST 41 (*)    GFR calc non Af Amer 75 (*)    GFR calc Af Amer 87 (*)    All  other components within normal limits  DIGOXIN LEVEL   Imaging Review Dg Tibia/fibula Left  07/28/2013   CLINICAL DATA:  Cellulitis  EXAM: LEFT TIBIA AND FIBULA - 2 VIEW  COMPARISON:  None.  FINDINGS: Frontal and  lateral views were obtained. There is no fracture or dislocation. No erosive change or bony destruction. There is arterial vascular calcification. There is no soft tissue air.  IMPRESSION: No bony destruction or soft tissue air. No fracture or dislocation. Atherosclerotic calcification noted.   Electronically Signed   By: Lowella Grip M.D.   On: 07/28/2013 21:15     EKG Interpretation None      MDM   Final diagnoses:  None   [name] is a 81 y.o. female here with cellulitis. Will get xray, labs. Will give abx.   9:29 PM Xray showed no free air. WBC 11. Labs otherwise at baseline. Low grade temp, no fever. I gave her IV clinda. Given that she doesn't appear septic and is not diabetic, I offered admission vs discharge with close f/u. Family and patient feels that she will be more comfortable at the assisted living. Will d/c home with clinda.     Wandra Arthurs, MD 07/28/13 2130

## 2013-07-28 NOTE — ED Notes (Signed)
Pt scrapped her lt leg 1 week ago. Pt lives in [facility] and they have seen her leg but family is concerned due to it is getting worse and the leg did have some swelling and warmth to it.

## 2013-10-19 ENCOUNTER — Emergency Department (HOSPITAL_COMMUNITY): Payer: Medicare Other

## 2013-10-19 ENCOUNTER — Encounter (HOSPITAL_COMMUNITY): Payer: Self-pay | Admitting: Emergency Medicine

## 2013-10-19 ENCOUNTER — Emergency Department (HOSPITAL_COMMUNITY)
Admission: EM | Admit: 2013-10-19 | Discharge: 2013-10-19 | Disposition: A | Discharge status: Home / Self Care | Payer: Medicare Other | Attending: Emergency Medicine | Admitting: Emergency Medicine

## 2013-10-19 DIAGNOSIS — M5137 Other intervertebral disc degeneration, lumbosacral region: Secondary | ICD-10-CM | POA: Insufficient documentation

## 2013-10-19 DIAGNOSIS — Y939 Activity, unspecified: Secondary | ICD-10-CM | POA: Insufficient documentation

## 2013-10-19 DIAGNOSIS — Y92009 Unspecified place in unspecified non-institutional (private) residence as the place of occurrence of the external cause: Secondary | ICD-10-CM | POA: Insufficient documentation

## 2013-10-19 DIAGNOSIS — M51379 Other intervertebral disc degeneration, lumbosacral region without mention of lumbar back pain or lower extremity pain: Secondary | ICD-10-CM | POA: Insufficient documentation

## 2013-10-19 DIAGNOSIS — J449 Chronic obstructive pulmonary disease, unspecified: Secondary | ICD-10-CM | POA: Diagnosis not present

## 2013-10-19 DIAGNOSIS — M810 Age-related osteoporosis without current pathological fracture: Secondary | ICD-10-CM | POA: Diagnosis not present

## 2013-10-19 DIAGNOSIS — S1093XA Contusion of unspecified part of neck, initial encounter: Secondary | ICD-10-CM

## 2013-10-19 DIAGNOSIS — E785 Hyperlipidemia, unspecified: Secondary | ICD-10-CM | POA: Insufficient documentation

## 2013-10-19 DIAGNOSIS — W19XXXA Unspecified fall, initial encounter: Secondary | ICD-10-CM

## 2013-10-19 DIAGNOSIS — S0101XA Laceration without foreign body of scalp, initial encounter: Secondary | ICD-10-CM

## 2013-10-19 DIAGNOSIS — Z880 Allergy status to penicillin: Secondary | ICD-10-CM | POA: Insufficient documentation

## 2013-10-19 DIAGNOSIS — R296 Repeated falls: Secondary | ICD-10-CM | POA: Diagnosis not present

## 2013-10-19 DIAGNOSIS — I10 Essential (primary) hypertension: Secondary | ICD-10-CM | POA: Insufficient documentation

## 2013-10-19 DIAGNOSIS — S0083XA Contusion of other part of head, initial encounter: Secondary | ICD-10-CM | POA: Insufficient documentation

## 2013-10-19 DIAGNOSIS — S0100XA Unspecified open wound of scalp, initial encounter: Secondary | ICD-10-CM | POA: Diagnosis not present

## 2013-10-19 DIAGNOSIS — Z79899 Other long term (current) drug therapy: Secondary | ICD-10-CM | POA: Insufficient documentation

## 2013-10-19 DIAGNOSIS — Z87898 Personal history of other specified conditions: Secondary | ICD-10-CM | POA: Insufficient documentation

## 2013-10-19 DIAGNOSIS — Z7982 Long term (current) use of aspirin: Secondary | ICD-10-CM | POA: Diagnosis not present

## 2013-10-19 DIAGNOSIS — S0003XA Contusion of scalp, initial encounter: Secondary | ICD-10-CM | POA: Insufficient documentation

## 2013-10-19 DIAGNOSIS — F0390 Unspecified dementia without behavioral disturbance: Secondary | ICD-10-CM | POA: Diagnosis not present

## 2013-10-19 DIAGNOSIS — S0993XA Unspecified injury of face, initial encounter: Secondary | ICD-10-CM | POA: Diagnosis present

## 2013-10-19 DIAGNOSIS — S199XXA Unspecified injury of neck, initial encounter: Secondary | ICD-10-CM | POA: Diagnosis present

## 2013-10-19 DIAGNOSIS — J4489 Other specified chronic obstructive pulmonary disease: Secondary | ICD-10-CM | POA: Insufficient documentation

## 2013-10-19 LAB — BASIC METABOLIC PANEL
BUN: 29 mg/dL — AB (ref 6–23)
CO2: 30 mEq/L (ref 19–32)
Calcium: 10.8 mg/dL — ABNORMAL HIGH (ref 8.4–10.5)
Chloride: 98 mEq/L (ref 96–112)
Creatinine, Ser: 0.86 mg/dL (ref 0.50–1.10)
GFR calc non Af Amer: 58 mL/min — ABNORMAL LOW (ref >90)
GFR, EST AFRICAN AMERICAN: 67 mL/min — AB (ref >90)
Glucose, Bld: 116 mg/dL — ABNORMAL HIGH (ref 70–99)
POTASSIUM: 4.1 meq/L (ref 3.7–5.3)
Sodium: 136 mEq/L — ABNORMAL LOW (ref 137–147)

## 2013-10-19 LAB — URINALYSIS, ROUTINE W REFLEX MICROSCOPIC
BILIRUBIN URINE: NEGATIVE
Glucose, UA: NEGATIVE mg/dL
HGB URINE DIPSTICK: NEGATIVE
Ketones, ur: NEGATIVE mg/dL
Leukocytes, UA: NEGATIVE
Nitrite: NEGATIVE
Protein, ur: 30 mg/dL — AB
Specific Gravity, Urine: 1.02 (ref 1.005–1.030)
UROBILINOGEN UA: 0.2 mg/dL (ref 0.0–1.0)
pH: 6.5 (ref 5.0–8.0)

## 2013-10-19 LAB — CBC WITH DIFFERENTIAL/PLATELET
BASOS ABS: 0.1 10*3/uL (ref 0.0–0.1)
BASOS PCT: 1 % (ref 0–1)
Eosinophils Absolute: 0.1 10*3/uL (ref 0.0–0.7)
Eosinophils Relative: 1 % (ref 0–5)
HCT: 32.6 % — ABNORMAL LOW (ref 36.0–46.0)
Hemoglobin: 11.1 g/dL — ABNORMAL LOW (ref 12.0–15.0)
Lymphocytes Relative: 14 % (ref 12–46)
Lymphs Abs: 1.8 10*3/uL (ref 0.7–4.0)
MCH: 29.4 pg (ref 26.0–34.0)
MCHC: 34 g/dL (ref 30.0–36.0)
MCV: 86.5 fL (ref 78.0–100.0)
MONO ABS: 1.6 10*3/uL — AB (ref 0.1–1.0)
Monocytes Relative: 12 % (ref 3–12)
NEUTROS PCT: 72 % (ref 43–77)
Neutro Abs: 9.4 10*3/uL — ABNORMAL HIGH (ref 1.7–7.7)
Platelets: 286 10*3/uL (ref 150–400)
RBC: 3.77 MIL/uL — ABNORMAL LOW (ref 3.87–5.11)
RDW: 15 % (ref 11.5–15.5)
WBC: 12.9 10*3/uL — ABNORMAL HIGH (ref 4.0–10.5)

## 2013-10-19 LAB — URINE MICROSCOPIC-ADD ON

## 2013-10-19 LAB — TROPONIN I: Troponin I: <0.3 ng/mL (ref <0.30)

## 2013-10-19 MED ORDER — OXYCODONE-ACETAMINOPHEN 5-325 MG PO TABS: 1.0000 | ORAL_TABLET | Freq: Three times a day (TID) | ORAL | Status: AC | PRN

## 2013-10-19 MED ORDER — HYDROCODONE-ACETAMINOPHEN 5-325 MG PO TABS
1.0000 | ORAL_TABLET | Freq: Once | ORAL | Status: AC
  Administered 2013-10-19: 1 via ORAL
  Filled 2013-10-19: qty 1

## 2013-10-19 NOTE — ED Provider Notes (Signed)
CSN: [id number]     Arrival date & time 10/19/13  0528 History   First MD Initiated Contact with Patient 10/19/13 [phone]     Chief Complaint  Patient presents with  . Fall     (Consider location/radiation/quality/duration/timing/severity/associated sxs/prior Treatment) HPI  This is an 80 year old female with history of dementia who presents following a fall. Per EMS, this was an unwitnessed fall and the patient was found in her bathroom bleeding from the back of her head. She's unable to tell whether she passed out but states "I don't think that it." She's not currently on any blood thinners.  She denies any other pain. Patient is awake, alert, and oriented at this time.    Past Medical History  Diagnosis Date  . Cancer   . Lymphoma, high grade 07/23/2011  . Benign essential HTN 07/23/2011  . Hyperlipidemia 07/23/2011  . 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 DJD (degenerative joint disease), lumbar 07/23/2011  . Current non-smoker but past smoking history unknown 12/08/12  . COPD (chronic obstructive pulmonary disease)    Past Surgical History  Procedure Laterality Date  . Colon surgery    . Appendectomy    . Abdominal hysterectomy    . Hernia repair    . Tonsillectomy    . Splenectomy    . Shoulder surgery    . Hemorroidectomy    . Esophagogastroduodenoscopy N/A 12/08/2012    Procedure: ESOPHAGOGASTRODUODENOSCOPY (EGD);  Surgeon: Wonda Horner, MD;  Location: [facility];  Service: Endoscopy;  Laterality: N/A;  . Esophagogastroduodenoscopy N/A 04/10/2013    Procedure: ESOPHAGOGASTRODUODENOSCOPY (EGD);  Surgeon: Winfield Cunas., MD;  Location: [facility];  Service: Endoscopy;  Laterality: N/A;   Family History  Problem Relation Age of Onset  . Cancer - Other Mother     liver  . Stroke Father   . Cancer Brother     prostate/colon  . Dementia Brother   . Dementia Brother    History  Substance Use Topics  . Smoking status: Never Smoker   . Smokeless tobacco: Never Used   . Alcohol Use: No   OB History   Grav Para Term Preterm Abortions TAB SAB Ect Mult Living                 Review of Systems  Constitutional: Negative for fever.  Respiratory: Negative for chest tightness and shortness of breath.   Cardiovascular: Negative for chest pain.  Gastrointestinal: Negative for abdominal pain.  Musculoskeletal: Negative for back pain and neck pain.  Neurological: Positive for headaches. Negative for dizziness and syncope.  Psychiatric/Behavioral: Negative for confusion.  All other systems reviewed and are negative.     Allergies  Iodine; Penicillins; and Shellfish-derived products  Home Medications   Prior to Admission medications   Medication Sig Start Date End Date Taking? Authorizing Provider  acetaminophen (TYLENOL) 500 MG tablet Take 500 mg by mouth every 6 (six) hours as needed for mild pain.    Yes Historical Provider, MD  aspirin 81 MG tablet Take 81 mg by mouth daily.   Yes Historical Provider, MD  Cholecalciferol (VITAMIN D PO) Take 1 tablet by mouth daily.   Yes Historical Provider, MD  digoxin (LANOXIN) 0.125 MG tablet Take 125 mcg by mouth daily.  06/10/11  Yes Historical Provider, MD  docusate sodium (COLACE) 100 MG capsule Take 100 mg by mouth daily.   Yes Historical Provider, MD  donepezil (ARICEPT) 10 MG tablet Take 10 mg by mouth at bedtime.   Yes  Historical Provider, MD  fenofibrate 160 MG tablet Take 160 mg by mouth.  06/02/11  Yes Historical Provider, MD  fish oil-omega-3 fatty acids 1000 MG capsule Take 1 g by mouth daily.   Yes Historical Provider, MD  lansoprazole (PREVACID) 15 MG capsule Take 15 mg by mouth daily at 12 noon.   Yes Historical Provider, MD  LORazepam (ATIVAN) 0.5 MG tablet Take 0.25 mg by mouth every 6 (six) hours as needed for anxiety.   Yes Historical Provider, MD  LYRICA 100 MG capsule Take 100 mg by mouth at bedtime.  07/09/11  Yes Historical Provider, MD  Nutritional Supplements (ENSURE PO) Take 1 Can by mouth  3 (three) times daily. Strawberry   Yes Historical Provider, MD  potassium chloride SA (K-DUR,KLOR-CON) 20 MEQ tablet Take 20 mEq by mouth daily.   Yes Historical Provider, MD  Prenatal Vit-Fe Fumarate-FA (MULTIVITAMIN-PRENATAL) 27-0.8 MG TABS Take 1 tablet by mouth daily.   Yes Historical Provider, MD  risperiDONE (RISPERDAL) 0.25 MG tablet Take 0.25 mg by mouth at bedtime.   Yes Historical Provider, MD  oxyCODONE-acetaminophen (PERCOCET/ROXICET) 5-325 MG per tablet Take 1 tablet by mouth every 8 (eight) hours as needed for severe pain. 10/19/13   Domenic Moras, PA-C   BP 137/84  Pulse 79  Temp(Src) 97.4 F (36.3 C) (Oral)  Resp 16  SpO2 97% Physical Exam  Nursing note and vitals reviewed. Constitutional: She is oriented to person, place, and time. No distress.  Elderly  HENT:  Head: Normocephalic.  Evidence of bleeding from the right posterior scalp , palpable hematoma,   Eyes: EOM are normal. Pupils are equal, round, and reactive to light.  Neck: Normal range of motion. Neck supple.  Cardiovascular: Normal rate, regular rhythm and normal heart sounds.   No murmur heard. Pulmonary/Chest: Effort normal. No respiratory distress. She has no wheezes.  Abdominal: Soft. There is no tenderness.  Musculoskeletal:  Moves all 4 extremities, no obvious deformity  Neurological: She is alert and oriented to person, place, and time. No cranial nerve deficit.  Skin: Skin is warm and dry.  Psychiatric: She has a normal mood and affect.    ED Course  Procedures (including critical care time) Labs Review Labs Reviewed  CBC WITH DIFFERENTIAL - Abnormal; Notable for the following:    WBC 12.9 (*)    RBC 3.77 (*)    Hemoglobin 11.1 (*)    HCT 32.6 (*)    Neutro Abs 9.4 (*)    Monocytes Absolute 1.6 (*)    All other components within normal limits  BASIC METABOLIC PANEL - Abnormal; Notable for the following:    Sodium 136 (*)    Glucose, Bld 116 (*)    BUN 29 (*)    Calcium 10.8 (*)    GFR  calc non Af Amer 58 (*)    GFR calc Af Amer 67 (*)    All other components within normal limits  URINALYSIS, ROUTINE W REFLEX MICROSCOPIC - Abnormal; Notable for the following:    Protein, ur 30 (*)    All other components within normal limits  TROPONIN I  URINE MICROSCOPIC-ADD ON    Imaging Review Dg Wrist Complete Left  10/19/2013   CLINICAL DATA:  fall  EXAM: LEFT WRIST - COMPLETE 3+ VIEW  COMPARISON:  None.  FINDINGS: There is no evidence of fracture or dislocation. Atherosclerotic calcifications appreciated. Marked joint space narrowing, periarticular sclerosis and peripheral hypertrophic spurring involving the first carpometacarpal joint. The bones are osteopenic. Soft  tissues otherwise unremarkable.  IMPRESSION: Advanced osteoarthritic changes.  No acute osseous abnormalities.   Electronically Signed   By: Margaree Mackintosh M.D.   On: 10/19/2013 09:08     EKG Interpretation   Date/Time:  Friday October 19 2013 06:13:34 EDT Ventricular Rate:  86 PR Interval:  193 QRS Duration: 80 QT Interval:  358 QTC Calculation: 428 R Axis:   25 Text Interpretation:  Sinus rhythm Anterior infarct, old Confirmed by  Aubri Gathright  MD, Evelen Vazguez ([id number]) on 10/21/2013 7:23:06 AM      MDM   Final diagnoses:  Fall  Scalp laceration  Scalp hematoma    Patient presents following a fall. Denies syncope.  Laceration and hematoma to the scalp. Currently not on anticoagulants. EKG reassuring. CT head and neck negative for intracranial pathology. Laceration repaired at the bedside. UA pending at time of signout.  Merryl Hacker, MD 10/21/13 [phone]

## 2013-10-19 NOTE — ED Provider Notes (Signed)
Pt has a scalp laceration which was repaired by me.  LACERATION REPAIR Performed by: Domenic Moras Authorized by: Domenic Moras Consent: Verbal consent obtained. Risks and benefits: risks, benefits and alternatives were discussed Consent given by: patient Patient identity confirmed: provided demographic data Prepped and Draped in normal sterile fashion Wound explored  Laceration Location: vertex of scalp  Laceration Length: 2cm  No Foreign Bodies seen or palpated  Anesthesia: local infiltration  Local anesthetic: lidocaine 2% w/o epinephrine  Anesthetic total: 5 ml  Irrigation method: syringe Amount of cleaning: standard  Skin closure: surgical staple and sutures  Number of sutures: 5 staples, 1 suture  Technique: simple interrupted  Patient tolerance: Patient tolerated the procedure well with no immediate complications.  10:20 AM Daughter worries about L wrist pain.  Xray obtained and neg for acute fx.  UA without evidence of UTI.  Pt stable for discharge.  Will need to have scalp staples and suture removed in 7 days.    Domenic Moras, PA-C 10/19/13 1030

## 2013-10-19 NOTE — ED Notes (Signed)
Patient presents to ED via [facility]. Per EMS patient had an "unwitnessed fall" at the nursing home in the bathroom. Denies any neck.back pain. Patient is not currently on blood thinners.  Patient presents to ED with a laceration to back of head as well as a hematoma- bleeding controlled at this time.  Patient is also c/o left wrist pain- no deformity/swelling noted at this time. Patient is A&Ox4- slow to respond but answers questions appropriately.

## 2013-10-19 NOTE — ED Notes (Signed)
Did in and out cath on patient dark yellow urine in return assisted by [facility] EMT

## 2013-10-19 NOTE — Discharge Instructions (Signed)
Please follow up with your doctor in 5-7 days to have your scalp staples and suture remove.  Follow instruction below. Take pain medication as needed for pain but be aware that your pain medication may cause increase drowsiness and increase risk of falls.    Head Injury, Adult You have received a head injury. It does not appear serious at this time. Headaches and vomiting are common following head injury. It should be easy to awaken from sleeping. Sometimes it is necessary for you to stay in the emergency department for a while for observation. Sometimes admission to the hospital may be needed. After injuries such as yours, most problems occur within the first 24 hours, but side effects may occur up to 7 10 days after the injury. It is important for you to carefully monitor your condition and contact your health care provider or seek immediate medical care if there is a change in your condition. WHAT ARE THE TYPES OF HEAD INJURIES? Head injuries can be as minor as a bump. Some head injuries can be more severe. More severe head injuries include:  A jarring injury to the brain (concussion).  A bruise of the brain (contusion). This mean there is bleeding in the brain that can cause swelling.  A cracked skull (skull fracture).  Bleeding in the brain that collects, clots, and forms a bump (hematoma). WHAT CAUSES A HEAD INJURY? A serious head injury is most likely to happen to someone who is in a car wreck and is not wearing a seat belt. Other causes of major head injuries include bicycle or motorcycle accidents, sports injuries, and falls. HOW ARE HEAD INJURIES DIAGNOSED? A complete history of the event leading to the injury and your current symptoms will be helpful in diagnosing head injuries. Many times, pictures of the brain, such as CT or MRI are needed to see the extent of the injury. Often, an overnight hospital stay is necessary for observation.  WHEN SHOULD I SEEK IMMEDIATE MEDICAL CARE?  You  should get help right away if:  You have confusion or drowsiness.  You feel sick to your stomach (nauseous) or have continued, forceful vomiting.  You have dizziness or unsteadiness that is getting worse.  You have severe, continued headaches not relieved by medicine. Only take over-the-counter or prescription medicines for pain, fever, or discomfort as directed by your health care provider.  You do not have normal function of the arms or legs or are unable to walk.  You notice changes in the black spots in the center of the colored part of your eye (pupil).  You have a clear or bloody fluid coming from your nose or ears.  You have a loss of vision. During the next 24 hours after the injury, you must stay with someone who can watch you for the warning signs. This person should contact local emergency services (911 in the [place] if you have seizures, you become unconscious, or you are unable to wake up. HOW CAN I PREVENT A HEAD INJURY IN THE FUTURE? The most important factor for preventing major head injuries is avoiding motor vehicle accidents. To minimize the potential for damage to your head, it is crucial to wear seat belts while riding in motor vehicles. Wearing helmets while bike riding and playing collision sports (like football) is also helpful. Also, avoiding dangerous activities around the house will further help reduce your risk of head injury.  WHEN CAN I RETURN TO NORMAL ACTIVITIES AND ATHLETICS? You should be reevaluated by  your health care provider before returning to these activities. If you have any of the following symptoms, you should not return to activities or contact sports until 1 week after the symptoms have stopped:  Persistent headache.  Dizziness or vertigo.  Poor attention and concentration.  Confusion.  Memory problems.  Nausea or vomiting.  Fatigue or tire easily.  Irritability.  Intolerant of bright lights or loud noises.  Anxiety or  depression.  Disturbed sleep. MAKE SURE YOU:   Understand these instructions.  Will watch your condition.  Will get help right away if you are not doing well or get worse. Document Released: 05/03/2005 Document Revised: 02/21/2013 Document Reviewed: 01/08/2013 [facility] Patient Information 2014 [place].  Laceration Care, Adult A laceration is a cut that goes through all layers of the skin. The cut goes into the tissue beneath the skin. HOME CARE For stitches (sutures) or staples:  Keep the cut clean and dry.  If you have a bandage (dressing), change it at least once a day. Change the bandage if it gets wet or dirty, or as told by your doctor.  Wash the cut with soap and water 2 times a day. Rinse the cut with water. Pat it dry with a clean towel.  Put a thin layer of medicated cream on the cut as told by your doctor.  You may shower after the first 24 hours. Do not soak the cut in water until the stitches are removed.  Only take medicines as told by your doctor.  Have your stitches or staples removed as told by your doctor. For skin adhesive strips:  Keep the cut clean and dry.  Do not get the strips wet. You may take a bath, but be careful to keep the cut dry.  If the cut gets wet, pat it dry with a clean towel.  The strips will fall off on their own. Do not remove the strips that are still stuck to the cut. For wound glue:  You may shower or take baths. Do not soak or scrub the cut. Do not swim. Avoid heavy sweating until the glue falls off on its own. After a shower or bath, pat the cut dry with a clean towel.  Do not put medicine on your cut until the glue falls off.  If you have a bandage, do not put tape over the glue.  Avoid lots of sunlight or tanning lamps until the glue falls off. Put sunscreen on the cut for the first year to reduce your scar.  The glue will fall off on its own. Do not pick at the glue. You may need a tetanus shot if:  You cannot  remember when you had your last tetanus shot.  You have never had a tetanus shot. If you need a tetanus shot and you choose not to have one, you may get tetanus. Sickness from tetanus can be serious. GET HELP RIGHT AWAY IF:   Your pain does not get better with medicine.  Your arm, hand, leg, or foot loses feeling (numbness) or changes color.  Your cut is bleeding.  Your joint feels weak, or you cannot use your joint.  You have painful lumps on your body.  Your cut is red, puffy (swollen), or painful.  You have a red line on the skin near the cut.  You have yellowish-white fluid (pus) coming from the cut.  You have a fever.  You have a bad smell coming from the cut or bandage.  Your  cut breaks open before or after stitches are removed.  You notice something coming out of the cut, such as wood or glass.  You cannot move a finger or toe. MAKE SURE YOU:   Understand these instructions.  Will watch your condition.  Will get help right away if you are not doing well or get worse. Document Released: 10/20/2007 Document Revised: 07/26/2011 Document Reviewed: 10/27/2010 [facility] Patient Information 2014 [place].

## 2013-10-19 NOTE — ED Notes (Signed)
Assisted [place], [place] with in and out cath; cleaned pt and placed pt on a green chuk; pt resting at this time

## 2013-10-20 NOTE — ED Provider Notes (Signed)
Medical screening examination/treatment/procedure(s) were performed by non-physician practitioner and as supervising physician I was immediately available for consultation/collaboration.   EKG Interpretation None       Merryl Hacker, MD 10/20/13 [phone]

## 2014-03-17 ENCOUNTER — Encounter (HOSPITAL_COMMUNITY): Payer: Self-pay | Admitting: Emergency Medicine

## 2014-03-17 ENCOUNTER — Emergency Department (HOSPITAL_COMMUNITY): Payer: Medicare Other

## 2014-03-17 ENCOUNTER — Inpatient Hospital Stay (HOSPITAL_COMMUNITY)
Admission: EM | Admit: 2014-03-17 | Discharge: 2014-03-25 | DRG: 194 | Disposition: A | Discharge status: Nursing Home (Includes ICF) | Payer: Medicare Other | Attending: Endocrinology | Admitting: Endocrinology

## 2014-03-17 DIAGNOSIS — Z681 Body mass index (BMI) 19 or less, adult: Secondary | ICD-10-CM

## 2014-03-17 DIAGNOSIS — Y95 Nosocomial condition: Secondary | ICD-10-CM | POA: Diagnosis present

## 2014-03-17 DIAGNOSIS — F329 Major depressive disorder, single episode, unspecified: Secondary | ICD-10-CM | POA: Diagnosis present

## 2014-03-17 DIAGNOSIS — R0902 Hypoxemia: Secondary | ICD-10-CM | POA: Diagnosis not present

## 2014-03-17 DIAGNOSIS — Z9081 Acquired absence of spleen: Secondary | ICD-10-CM

## 2014-03-17 DIAGNOSIS — J9801 Acute bronchospasm: Secondary | ICD-10-CM | POA: Diagnosis present

## 2014-03-17 DIAGNOSIS — J918 Pleural effusion in other conditions classified elsewhere: Secondary | ICD-10-CM | POA: Diagnosis present

## 2014-03-17 DIAGNOSIS — R627 Adult failure to thrive: Secondary | ICD-10-CM | POA: Diagnosis present

## 2014-03-17 DIAGNOSIS — E785 Hyperlipidemia, unspecified: Secondary | ICD-10-CM | POA: Diagnosis present

## 2014-03-17 DIAGNOSIS — J440 Chronic obstructive pulmonary disease with acute lower respiratory infection: Secondary | ICD-10-CM | POA: Diagnosis present

## 2014-03-17 DIAGNOSIS — E440 Moderate protein-calorie malnutrition: Secondary | ICD-10-CM | POA: Diagnosis present

## 2014-03-17 DIAGNOSIS — I10 Essential (primary) hypertension: Secondary | ICD-10-CM | POA: Diagnosis present

## 2014-03-17 DIAGNOSIS — Z7982 Long term (current) use of aspirin: Secondary | ICD-10-CM

## 2014-03-17 DIAGNOSIS — J13 Pneumonia due to Streptococcus pneumoniae: Secondary | ICD-10-CM | POA: Diagnosis not present

## 2014-03-17 DIAGNOSIS — E43 Unspecified severe protein-calorie malnutrition: Secondary | ICD-10-CM | POA: Diagnosis present

## 2014-03-17 DIAGNOSIS — J189 Pneumonia, unspecified organism: Secondary | ICD-10-CM | POA: Diagnosis present

## 2014-03-17 DIAGNOSIS — I4891 Unspecified atrial fibrillation: Secondary | ICD-10-CM | POA: Diagnosis present

## 2014-03-17 DIAGNOSIS — F0390 Unspecified dementia without behavioral disturbance: Secondary | ICD-10-CM | POA: Diagnosis present

## 2014-03-17 DIAGNOSIS — R7302 Impaired glucose tolerance (oral): Secondary | ICD-10-CM | POA: Diagnosis present

## 2014-03-17 DIAGNOSIS — Z66 Do not resuscitate: Secondary | ICD-10-CM | POA: Diagnosis present

## 2014-03-17 DIAGNOSIS — E049 Nontoxic goiter, unspecified: Secondary | ICD-10-CM | POA: Diagnosis present

## 2014-03-17 DIAGNOSIS — C8590 Non-Hodgkin lymphoma, unspecified, unspecified site: Secondary | ICD-10-CM | POA: Diagnosis present

## 2014-03-17 DIAGNOSIS — Z79899 Other long term (current) drug therapy: Secondary | ICD-10-CM

## 2014-03-17 DIAGNOSIS — D649 Anemia, unspecified: Secondary | ICD-10-CM | POA: Diagnosis present

## 2014-03-17 LAB — BASIC METABOLIC PANEL
Anion gap: 11 (ref 5–15)
BUN: 38 mg/dL — ABNORMAL HIGH (ref 6–23)
CHLORIDE: 96 meq/L (ref 96–112)
CO2: 29 meq/L (ref 19–32)
Calcium: 11 mg/dL — ABNORMAL HIGH (ref 8.4–10.5)
Creatinine, Ser: 0.93 mg/dL (ref 0.50–1.10)
GFR calc Af Amer: 61 mL/min — ABNORMAL LOW (ref >90)
GFR calc non Af Amer: 53 mL/min — ABNORMAL LOW (ref >90)
GLUCOSE: 153 mg/dL — AB (ref 70–99)
POTASSIUM: 4.7 meq/L (ref 3.7–5.3)
SODIUM: 136 meq/L — AB (ref 137–147)

## 2014-03-17 LAB — CBC WITH DIFFERENTIAL/PLATELET
Basophils Absolute: 0 10*3/uL (ref 0.0–0.1)
Basophils Relative: 0 % (ref 0–1)
EOS PCT: 0 % (ref 0–5)
Eosinophils Absolute: 0 10*3/uL (ref 0.0–0.7)
HEMATOCRIT: 38.7 % (ref 36.0–46.0)
HEMOGLOBIN: 13 g/dL (ref 12.0–15.0)
LYMPHS ABS: 0.9 10*3/uL (ref 0.7–4.0)
Lymphocytes Relative: 3 % — ABNORMAL LOW (ref 12–46)
MCH: 29.7 pg (ref 26.0–34.0)
MCHC: 33.6 g/dL (ref 30.0–36.0)
MCV: 88.4 fL (ref 78.0–100.0)
MONOS PCT: 7 % (ref 3–12)
Monocytes Absolute: 2 10*3/uL — ABNORMAL HIGH (ref 0.1–1.0)
Neutro Abs: 25.5 10*3/uL — ABNORMAL HIGH (ref 1.7–7.7)
Neutrophils Relative %: 90 % — ABNORMAL HIGH (ref 43–77)
Platelets: 307 10*3/uL (ref 150–400)
RBC: 4.38 MIL/uL (ref 3.87–5.11)
RDW: 18.2 % — ABNORMAL HIGH (ref 11.5–15.5)
WBC: 28.4 10*3/uL — AB (ref 4.0–10.5)

## 2014-03-17 LAB — PROTIME-INR
INR: 1.18 (ref 0.00–1.49)
Prothrombin Time: 15.2 seconds (ref 11.6–15.2)

## 2014-03-17 LAB — I-STAT CG4 LACTIC ACID, ED: LACTIC ACID, VENOUS: 1.01 mmol/L (ref 0.5–2.2)

## 2014-03-17 LAB — PRO B NATRIURETIC PEPTIDE: PRO B NATRI PEPTIDE: 14687 pg/mL — AB (ref 0–450)

## 2014-03-17 MED ORDER — SODIUM CHLORIDE 0.9 % IV BOLUS (SEPSIS)
1000.0000 mL | Freq: Once | INTRAVENOUS | Status: AC
Start: 2014-03-17 — End: 2014-03-18
  Administered 2014-03-17: 1000 mL via INTRAVENOUS

## 2014-03-17 MED ORDER — AZTREONAM 2 G IJ SOLR
2.0000 g | Freq: Once | INTRAMUSCULAR | Status: AC
  Administered 2014-03-17: 2 g via INTRAVENOUS
  Filled 2014-03-17: qty 2

## 2014-03-17 MED ORDER — VANCOMYCIN HCL IN DEXTROSE 1-5 GM/200ML-% IV SOLN
1000.0000 mg | Freq: Once | INTRAVENOUS | Status: AC
  Administered 2014-03-18: 1000 mg via INTRAVENOUS
  Filled 2014-03-17: qty 200

## 2014-03-17 NOTE — ED Provider Notes (Signed)
CSN: [id number]     Arrival date & time 03/17/14  1818 History   First MD Initiated Contact with Patient 03/17/14 2112     Chief Complaint  Patient presents with  . flu like symptoms      (Consider location/radiation/quality/duration/timing/severity/associated sxs/prior Treatment) The history is provided by the patient and a relative. No language interpreter was used.  [name] is an 80 year old female with past medical history of lymphoma in remission since 2002 with splenectomy performed approximately 25 years ago, hyperlipidemia, osteoporosis, COPD presenting to the emergency department with cough that is productive, nasal congestion and chills have been ongoing for the past couple of days. Patient is a resident at [facility] was brought in by daughter. As per daughter's report, reported that yesterday and today staff at [facility] reported the patient has been appearing weak and would not eat well. As per daughter, reported that she's been having a productive cough for the past couple of days and some chest congestion. Stated that patient does have history of dementia and is on medications. Denied difficulty breathing, nausea, vomiting, diarrhea, change to urinary bowel movements. PCP Dr. Forde Dandy  Past Medical History  Diagnosis Date  . Cancer   . Lymphoma, high grade 07/23/2011  . Benign essential HTN 07/23/2011  . Hyperlipidemia 07/23/2011  . 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 DJD (degenerative joint disease), lumbar 07/23/2011  . Current non-smoker but past smoking history unknown 12/08/12  . COPD (chronic obstructive pulmonary disease)    Past Surgical History  Procedure Laterality Date  . Colon surgery    . Appendectomy    . Abdominal hysterectomy    . Hernia repair    . Tonsillectomy    . Splenectomy    . Shoulder surgery    . Hemorroidectomy    . Esophagogastroduodenoscopy N/A 12/08/2012    Procedure: ESOPHAGOGASTRODUODENOSCOPY (EGD);  Surgeon: Wonda Horner,  MD;  Location: [facility];  Service: Endoscopy;  Laterality: N/A;  . Esophagogastroduodenoscopy N/A 04/10/2013    Procedure: ESOPHAGOGASTRODUODENOSCOPY (EGD);  Surgeon: Winfield Cunas., MD;  Location: [facility];  Service: Endoscopy;  Laterality: N/A;   Family History  Problem Relation Age of Onset  . Cancer - Other Mother     liver  . Stroke Father   . Cancer Brother     prostate/colon  . Dementia Brother   . Dementia Brother    History  Substance Use Topics  . Smoking status: Never Smoker   . Smokeless tobacco: Never Used  . Alcohol Use: No   OB History    No data available     Review of Systems  Constitutional: Negative for fever and chills.  HENT: Positive for congestion.   Respiratory: Positive for cough. Negative for shortness of breath.   Cardiovascular: Negative for chest pain.  Gastrointestinal: Negative for nausea and vomiting.  Musculoskeletal: Negative for neck pain and neck stiffness.      Allergies  Iodine; Penicillins; and Shellfish-derived products  Home Medications   Prior to Admission medications   Medication Sig Start Date End Date Taking? Authorizing Provider  acetaminophen (TYLENOL) 500 MG tablet Take 500 mg by mouth every 6 (six) hours as needed for mild pain.    Yes Historical Provider, MD  aspirin 81 MG tablet Take 81 mg by mouth daily.   Yes Historical Provider, MD  Cholecalciferol (VITAMIN D PO) Take 1 tablet by mouth daily.   Yes Historical Provider, MD  docusate sodium (COLACE) 100 MG capsule Take 100  mg by mouth daily.   Yes Historical Provider, MD  donepezil (ARICEPT) 10 MG tablet Take 10 mg by mouth at bedtime.   Yes Historical Provider, MD  escitalopram (LEXAPRO) 10 MG tablet Take 10 mg by mouth daily.   Yes Historical Provider, MD  fenofibrate 160 MG tablet Take 80 mg by mouth daily.  06/02/11  Yes Historical Provider, MD  ferrous sulfate 325 (65 FE) MG tablet Take 325 mg by mouth 2 (two) times daily.   Yes Historical Provider,  MD  LORazepam (ATIVAN) 0.5 MG tablet Take 0.25 mg by mouth every 6 (six) hours as needed for anxiety.   Yes Historical Provider, MD  LYRICA 100 MG capsule Take 100 mg by mouth at bedtime.  07/09/11  Yes Historical Provider, MD  metoprolol succinate (TOPROL-XL) 25 MG 24 hr tablet Take 25 mg by mouth daily.   Yes Historical Provider, MD  Nutritional Supplements (ENSURE PO) Take 1 Can by mouth 3 (three) times daily. Strawberry   Yes Historical Provider, MD  oxyCODONE-acetaminophen (PERCOCET/ROXICET) 5-325 MG per tablet Take 1 tablet by mouth every 8 (eight) hours as needed for severe pain. Patient taking differently: Take 1 tablet by mouth 2 (two) times daily.  10/19/13  Yes Domenic Moras, PA-C  oxyCODONE-acetaminophen (PERCOCET/ROXICET) 5-325 MG per tablet Take 1 tablet by mouth daily as needed for moderate pain or severe pain.   Yes Historical Provider, MD  potassium chloride SA (K-DUR,KLOR-CON) 20 MEQ tablet Take 20 mEq by mouth daily.   Yes Historical Provider, MD  Prenatal Vit-Fe Fumarate-FA (MULTIVITAMIN-PRENATAL) 27-0.8 MG TABS Take 1 tablet by mouth daily.   Yes Historical Provider, MD  risperiDONE (RISPERDAL) 0.5 MG tablet Take 0.5 mg by mouth at bedtime.   Yes Historical Provider, MD  digoxin (LANOXIN) 0.125 MG tablet Take 125 mcg by mouth daily.  06/10/11   Historical Provider, MD  fish oil-omega-3 fatty acids 1000 MG capsule Take 1 g by mouth daily.    Historical Provider, MD  lansoprazole (PREVACID) 15 MG capsule Take 15 mg by mouth daily as needed (acid reflux).     Historical Provider, MD  risperiDONE (RISPERDAL) 0.25 MG tablet Take 0.25 mg by mouth at bedtime.    Historical Provider, MD   BP 149/72 mmHg  Pulse 107  Temp(Src) 99.4 F (37.4 C) (Rectal)  Resp 18  SpO2 95%  LMP  (Exact Date) Physical Exam  Constitutional: She is oriented to person, place, and time. She appears well-developed. No distress.  Frail appearing elderly female  HENT:  Head: Normocephalic and atraumatic.  Dry  mucous membranes  Eyes: Conjunctivae and EOM are normal. Pupils are equal, round, and reactive to light. Right eye exhibits no discharge. Left eye exhibits no discharge.  Neck: Normal range of motion. Neck supple. No tracheal deviation present.  Cardiovascular: Normal rate, regular rhythm and normal heart sounds.  Exam reveals no friction rub.   No murmur heard. Pulses:      Radial pulses are 2+ on the right side, and 2+ on the left side.       Dorsalis pedis pulses are 2+ on the right side, and 2+ on the left side.  Cap refill < 3 seconds Negative swelling or pitting edema identified to the lower extremities bilaterally  Pulmonary/Chest: Effort normal. No respiratory distress. She has no wheezes. She has no rales. She exhibits no tenderness.  Decreased breath sounds to lower lobes bilaterally  Patient is able to speak in full sentences without diffculty Negative use of accessory  muscles  Musculoskeletal: Normal range of motion.  Lymphadenopathy:    She has no cervical adenopathy.  Neurological: She is alert and oriented to person, place, and time. No cranial nerve deficit. She exhibits normal muscle tone. Coordination normal.  Skin: Skin is warm and dry. No rash noted. She is not diaphoretic. No erythema.  Psychiatric: She has a normal mood and affect. Her behavior is normal. Thought content normal.  Nursing note and vitals reviewed.   ED Course  Procedures (including critical care time)  Results for orders placed or performed during the hospital encounter of 86/57/84  Basic metabolic panel  Result Value Ref Range   Sodium 136 (L) 137 - 147 mEq/L   Potassium 4.7 3.7 - 5.3 mEq/L   Chloride 96 96 - 112 mEq/L   CO2 29 19 - 32 mEq/L   Glucose, Bld 153 (H) 70 - 99 mg/dL   BUN 38 (H) 6 - 23 mg/dL   Creatinine, Ser 0.93 0.50 - 1.10 mg/dL   Calcium 11.0 (H) 8.4 - 10.5 mg/dL   GFR calc non Af Amer 53 (L) >90 mL/min   GFR calc Af Amer 61 (L) >90 mL/min   Anion gap 11 5 - 15  CBC WITH  DIFFERENTIAL  Result Value Ref Range   WBC 28.4 (H) 4.0 - 10.5 K/uL   RBC 4.38 3.87 - 5.11 MIL/uL   Hemoglobin 13.0 12.0 - 15.0 g/dL   HCT 38.7 36.0 - 46.0 %   MCV 88.4 78.0 - 100.0 fL   MCH 29.7 26.0 - 34.0 pg   MCHC 33.6 30.0 - 36.0 g/dL   RDW 18.2 (H) 11.5 - 15.5 %   Platelets 307 150 - 400 K/uL   Neutrophils Relative % 90 (H) 43 - 77 %   Lymphocytes Relative 3 (L) 12 - 46 %   Monocytes Relative 7 3 - 12 %   Eosinophils Relative 0 0 - 5 %   Basophils Relative 0 0 - 1 %   Neutro Abs 25.5 (H) 1.7 - 7.7 K/uL   Lymphs Abs 0.9 0.7 - 4.0 K/uL   Monocytes Absolute 2.0 (H) 0.1 - 1.0 K/uL   Eosinophils Absolute 0.0 0.0 - 0.7 K/uL   Basophils Absolute 0.0 0.0 - 0.1 K/uL   RBC Morphology POLYCHROMASIA PRESENT   Pro b natriuretic peptide (BNP)  Result Value Ref Range   Pro B Natriuretic peptide (BNP) 14687.0 (H) 0 - 450 pg/mL  Protime-INR  Result Value Ref Range   Prothrombin Time 15.2 11.6 - 15.2 seconds   INR 1.18 0.00 - 1.49  I-Stat CG4 Lactic Acid, ED  Result Value Ref Range   Lactic Acid, Venous 1.01 0.5 - 2.2 mmol/L    Labs Review Labs Reviewed  BASIC METABOLIC PANEL - Abnormal; Notable for the following:    Sodium 136 (*)    Glucose, Bld 153 (*)    BUN 38 (*)    Calcium 11.0 (*)    GFR calc non Af Amer 53 (*)    GFR calc Af Amer 61 (*)    All other components within normal limits  CBC WITH DIFFERENTIAL - Abnormal; Notable for the following:    WBC 28.4 (*)    RDW 18.2 (*)    Neutrophils Relative % 90 (*)    Lymphocytes Relative 3 (*)    Neutro Abs 25.5 (*)    Monocytes Absolute 2.0 (*)    All other components within normal limits  PRO B NATRIURETIC PEPTIDE - Abnormal;  Notable for the following:    Pro B Natriuretic peptide (BNP) 14687.0 (*)    All other components within normal limits  CULTURE, BLOOD (ROUTINE X 2)  CULTURE, BLOOD (ROUTINE X 2)  PROTIME-INR  URINALYSIS, ROUTINE W REFLEX MICROSCOPIC  TROPONIN I  I-STAT CG4 LACTIC ACID, ED    Imaging  Review Dg Chest 2 View  03/17/2014   CLINICAL DATA:  Productive cough. Low oxygen saturations. History of COPD.  EXAM: CHEST  2 VIEW  COMPARISON:  04/02/2013 and 07/21/2009  FINDINGS: Two views of the chest demonstrate hyperinflation. There are new densities in the left upper lung. There is also blunting at the left costophrenic angle which is new. Images of the left upper lung are slightly limited due to patient rotation. Heart size is within normal limits. Chronic linear densities in the anterior chest.  IMPRESSION: Airspace disease in the left upper lung and suspect a small left pleural effusion. Findings are most compatible with pneumonia. Recommend follow up to ensure resolution.   Electronically Signed   By: Markus Daft M.D.   On: 03/17/2014 19:47     EKG Interpretation None      MDM   Final diagnoses:  HCAP (healthcare-associated pneumonia)  Hypoxia    Medications  sodium chloride 0.9 % bolus 1,000 mL (not administered)  aztreonam (AZACTAM) 2 g in dextrose 5 % 50 mL IVPB (not administered)  vancomycin (VANCOCIN) IVPB 1000 mg/200 mL premix (not administered)   Filed Vitals:   03/17/14 1857 03/17/14 2136  BP:  149/72  Pulse: 80 107  Temp: 98.8 F (37.1 C) 99.4 F (37.4 C)  TempSrc:  Rectal  Resp: 18   SpO2: 93% 95%   EKG noted sinus arrhythmia with a heart rate of 84 bpm. Troponin negative elevation. BNP elevated at 14,687.0. CBC noted elevated white blood cell count of 20.4. BMP unremarkable. Lactic acid negative elevation.chest x-ray noted airspace disease in the left upper lung suspect of possible beginnings of pneumonia. Urine culture and blood cultures 2 pending. Patient started on IV fluids and IV antibiotics of Vanco and aztreonam secondary to penicillin allergy. While in the ED setting, patient was hypoxic with a pulse ox of 84% on room air-patient was immediately placed on oxygen therapy via nasal cannula at 3 L/m, pulse ox increased to 95%-daughter reported the  patient is not on oxygen therapy at home. Patient to be admitted to hospital secondary for hospital-acquired pneumonia since patient is from a nursing home. Awaiting phone call for admission - Dr. Alyson Locket to speak with admitting physician.   Jamse Mead, PA-C 03/18/14 [place], MD 03/22/14 [phone]

## 2014-03-17 NOTE — ED Notes (Signed)
Per daughter, states mom started not feeling well since yesterday-productive cough--84% on room air-place on 3 L [place] up to 91%-increased weakness

## 2014-03-17 NOTE — ED Provider Notes (Signed)
Care assumed from [facility].  D/W Dr. Brigitte Pulse Re: Admission.  Dr Brigitte Pulse en route.. Pt stable. Well oxygenated on 2l [place]. Abx infusing.   Tanna Furry, MD 03/17/14 [phone]

## 2014-03-17 NOTE — ED Notes (Signed)
Bed: WA04 Expected date:  Expected time:  Means of arrival:  Comments: Hold for ft pt

## 2014-03-18 ENCOUNTER — Encounter (HOSPITAL_COMMUNITY): Payer: Self-pay | Admitting: Internal Medicine

## 2014-03-18 DIAGNOSIS — J440 Chronic obstructive pulmonary disease with acute lower respiratory infection: Secondary | ICD-10-CM | POA: Diagnosis present

## 2014-03-18 DIAGNOSIS — R0902 Hypoxemia: Secondary | ICD-10-CM | POA: Diagnosis present

## 2014-03-18 DIAGNOSIS — E440 Moderate protein-calorie malnutrition: Secondary | ICD-10-CM | POA: Diagnosis present

## 2014-03-18 DIAGNOSIS — F329 Major depressive disorder, single episode, unspecified: Secondary | ICD-10-CM | POA: Diagnosis present

## 2014-03-18 DIAGNOSIS — E785 Hyperlipidemia, unspecified: Secondary | ICD-10-CM | POA: Diagnosis present

## 2014-03-18 DIAGNOSIS — Z9081 Acquired absence of spleen: Secondary | ICD-10-CM

## 2014-03-18 DIAGNOSIS — D649 Anemia, unspecified: Secondary | ICD-10-CM | POA: Diagnosis present

## 2014-03-18 DIAGNOSIS — J13 Pneumonia due to Streptococcus pneumoniae: Secondary | ICD-10-CM | POA: Diagnosis present

## 2014-03-18 DIAGNOSIS — J9801 Acute bronchospasm: Secondary | ICD-10-CM | POA: Diagnosis present

## 2014-03-18 DIAGNOSIS — R7302 Impaired glucose tolerance (oral): Secondary | ICD-10-CM | POA: Diagnosis present

## 2014-03-18 DIAGNOSIS — C8590 Non-Hodgkin lymphoma, unspecified, unspecified site: Secondary | ICD-10-CM | POA: Diagnosis present

## 2014-03-18 DIAGNOSIS — Y95 Nosocomial condition: Secondary | ICD-10-CM | POA: Diagnosis present

## 2014-03-18 DIAGNOSIS — Z66 Do not resuscitate: Secondary | ICD-10-CM | POA: Diagnosis present

## 2014-03-18 DIAGNOSIS — J189 Pneumonia, unspecified organism: Secondary | ICD-10-CM | POA: Diagnosis present

## 2014-03-18 DIAGNOSIS — R627 Adult failure to thrive: Secondary | ICD-10-CM | POA: Diagnosis present

## 2014-03-18 DIAGNOSIS — I10 Essential (primary) hypertension: Secondary | ICD-10-CM | POA: Diagnosis present

## 2014-03-18 DIAGNOSIS — F0390 Unspecified dementia without behavioral disturbance: Secondary | ICD-10-CM | POA: Diagnosis present

## 2014-03-18 DIAGNOSIS — E049 Nontoxic goiter, unspecified: Secondary | ICD-10-CM | POA: Diagnosis present

## 2014-03-18 DIAGNOSIS — Z79899 Other long term (current) drug therapy: Secondary | ICD-10-CM | POA: Diagnosis not present

## 2014-03-18 DIAGNOSIS — Z7982 Long term (current) use of aspirin: Secondary | ICD-10-CM | POA: Diagnosis not present

## 2014-03-18 DIAGNOSIS — J918 Pleural effusion in other conditions classified elsewhere: Secondary | ICD-10-CM | POA: Diagnosis present

## 2014-03-18 DIAGNOSIS — I4891 Unspecified atrial fibrillation: Secondary | ICD-10-CM | POA: Diagnosis present

## 2014-03-18 DIAGNOSIS — Z681 Body mass index (BMI) 19 or less, adult: Secondary | ICD-10-CM | POA: Diagnosis not present

## 2014-03-18 LAB — BASIC METABOLIC PANEL
Anion gap: 10 (ref 5–15)
BUN: 39 mg/dL — ABNORMAL HIGH (ref 6–23)
CO2: 30 mEq/L (ref 19–32)
CREATININE: 0.81 mg/dL (ref 0.50–1.10)
Calcium: 10.2 mg/dL (ref 8.4–10.5)
Chloride: 98 mEq/L (ref 96–112)
GFR calc Af Amer: 72 mL/min — ABNORMAL LOW (ref >90)
GFR, EST NON AFRICAN AMERICAN: 63 mL/min — AB (ref >90)
GLUCOSE: 109 mg/dL — AB (ref 70–99)
POTASSIUM: 4.1 meq/L (ref 3.7–5.3)
Sodium: 138 mEq/L (ref 137–147)

## 2014-03-18 LAB — CBC
HCT: 35.1 % — ABNORMAL LOW (ref 36.0–46.0)
Hemoglobin: 11.2 g/dL — ABNORMAL LOW (ref 12.0–15.0)
MCH: 29.2 pg (ref 26.0–34.0)
MCHC: 31.9 g/dL (ref 30.0–36.0)
MCV: 91.6 fL (ref 78.0–100.0)
Platelets: 281 10*3/uL (ref 150–400)
RBC: 3.83 MIL/uL — AB (ref 3.87–5.11)
RDW: 18 % — ABNORMAL HIGH (ref 11.5–15.5)
WBC: 27.3 10*3/uL — ABNORMAL HIGH (ref 4.0–10.5)

## 2014-03-18 LAB — URINALYSIS, ROUTINE W REFLEX MICROSCOPIC
Bilirubin Urine: NEGATIVE
Glucose, UA: NEGATIVE mg/dL
Hgb urine dipstick: NEGATIVE
KETONES UR: NEGATIVE mg/dL
Nitrite: NEGATIVE
PH: 5.5 (ref 5.0–8.0)
PROTEIN: 100 mg/dL — AB
SPECIFIC GRAVITY, URINE: 1.026 (ref 1.005–1.030)
UROBILINOGEN UA: 0.2 mg/dL (ref 0.0–1.0)

## 2014-03-18 LAB — TROPONIN I: Troponin I: <0.3 ng/mL (ref <0.30)

## 2014-03-18 LAB — URINE MICROSCOPIC-ADD ON

## 2014-03-18 LAB — INFLUENZA PANEL BY PCR (TYPE A & B)
H1N1FLUPCR: NOT DETECTED
Influenza A By PCR: NEGATIVE
Influenza B By PCR: NEGATIVE

## 2014-03-18 LAB — TSH: TSH: 0.545 u[IU]/mL (ref 0.350–4.500)

## 2014-03-18 MED ORDER — PNEUMOCOCCAL VAC POLYVALENT 25 MCG/0.5ML IJ INJ
0.5000 mL | INJECTION | INTRAMUSCULAR | Status: AC
  Administered 2014-03-19: 0.5 mL via INTRAMUSCULAR
  Filled 2014-03-18 (×2): qty 0.5

## 2014-03-18 MED ORDER — VITAMIN D3 25 MCG (1000 UNIT) PO TABS
1000.0000 [IU] | ORAL_TABLET | Freq: Every day | ORAL | Status: DC
  Administered 2014-03-18 – 2014-03-25 (×8): 1000 [IU] via ORAL
  Filled 2014-03-18 (×8): qty 1

## 2014-03-18 MED ORDER — ALUM & MAG HYDROXIDE-SIMETH 200-200-20 MG/5ML PO SUSP: 30.0000 mL | Freq: Four times a day (QID) | ORAL | Status: DC | PRN

## 2014-03-18 MED ORDER — ONDANSETRON HCL 4 MG PO TABS: 4.0000 mg | ORAL_TABLET | Freq: Four times a day (QID) | ORAL | Status: DC | PRN

## 2014-03-18 MED ORDER — ACETAMINOPHEN 650 MG RE SUPP: 650.0000 mg | Freq: Four times a day (QID) | RECTAL | Status: DC | PRN

## 2014-03-18 MED ORDER — ENSURE COMPLETE PO LIQD
237.0000 mL | Freq: Three times a day (TID) | ORAL | Status: DC
  Administered 2014-03-18 – 2014-03-25 (×17): 237 mL via ORAL

## 2014-03-18 MED ORDER — SODIUM CHLORIDE 0.9 % IV SOLN
INTRAVENOUS | Status: DC
  Administered 2014-03-18 – 2014-03-20 (×3): via INTRAVENOUS
  Administered 2014-03-21: 75 mL/h via INTRAVENOUS
  Administered 2014-03-21 – 2014-03-24 (×6): via INTRAVENOUS

## 2014-03-18 MED ORDER — HYDROCODONE-HOMATROPINE 5-1.5 MG/5ML PO SYRP
5.0000 mL | ORAL_SOLUTION | Freq: Four times a day (QID) | ORAL | Status: DC | PRN
Start: 2014-03-18 — End: 2014-03-25

## 2014-03-18 MED ORDER — PRENATAL 27-0.8 MG PO TABS
1.0000 | ORAL_TABLET | Freq: Every day | ORAL | Status: DC
  Administered 2014-03-18 – 2014-03-25 (×8): 1 via ORAL
  Filled 2014-03-18 (×8): qty 1

## 2014-03-18 MED ORDER — RISPERIDONE 0.5 MG PO TABS
0.5000 mg | ORAL_TABLET | Freq: Every day | ORAL | Status: DC
  Administered 2014-03-19 – 2014-03-24 (×7): 0.5 mg via ORAL
  Filled 2014-03-18 (×7): qty 1

## 2014-03-18 MED ORDER — FERROUS SULFATE 325 (65 FE) MG PO TABS
325.0000 mg | ORAL_TABLET | Freq: Two times a day (BID) | ORAL | Status: DC
  Administered 2014-03-18 – 2014-03-25 (×14): 325 mg via ORAL
  Filled 2014-03-18 (×16): qty 1

## 2014-03-18 MED ORDER — DOCUSATE SODIUM 100 MG PO CAPS
100.0000 mg | ORAL_CAPSULE | Freq: Every day | ORAL | Status: DC
  Administered 2014-03-18 – 2014-03-25 (×8): 100 mg via ORAL
  Filled 2014-03-18 (×8): qty 1

## 2014-03-18 MED ORDER — DEXTROSE 5 % IV SOLN
1.0000 g | Freq: Three times a day (TID) | INTRAVENOUS | Status: DC
  Administered 2014-03-18 – 2014-03-21 (×10): 1 g via INTRAVENOUS
  Filled 2014-03-18 (×10): qty 1

## 2014-03-18 MED ORDER — ENOXAPARIN SODIUM 30 MG/0.3ML ~~LOC~~ SOLN
30.0000 mg | SUBCUTANEOUS | Status: DC
  Administered 2014-03-18 – 2014-03-25 (×8): 30 mg via SUBCUTANEOUS
  Filled 2014-03-18 (×8): qty 0.3

## 2014-03-18 MED ORDER — PREGABALIN 50 MG PO CAPS
100.0000 mg | ORAL_CAPSULE | Freq: Every day | ORAL | Status: DC
  Administered 2014-03-19 – 2014-03-24 (×7): 100 mg via ORAL
  Filled 2014-03-18 (×8): qty 2

## 2014-03-18 MED ORDER — POTASSIUM CHLORIDE CRYS ER 20 MEQ PO TBCR
20.0000 meq | EXTENDED_RELEASE_TABLET | Freq: Every day | ORAL | Status: DC
  Administered 2014-03-18 – 2014-03-25 (×8): 20 meq via ORAL
  Filled 2014-03-18 (×8): qty 1

## 2014-03-18 MED ORDER — FENOFIBRATE 160 MG PO TABS
80.0000 mg | ORAL_TABLET | Freq: Every day | ORAL | Status: DC
  Administered 2014-03-18 – 2014-03-25 (×8): 80 mg via ORAL
  Filled 2014-03-18 (×8): qty 0.5

## 2014-03-18 MED ORDER — INFLUENZA VAC SPLIT QUAD 0.5 ML IM SUSY: 0.5000 mL | PREFILLED_SYRINGE | INTRAMUSCULAR | Status: DC | PRN

## 2014-03-18 MED ORDER — OXYCODONE-ACETAMINOPHEN 5-325 MG PO TABS: 1.0000 | ORAL_TABLET | Freq: Three times a day (TID) | ORAL | Status: DC | PRN

## 2014-03-18 MED ORDER — POLYETHYLENE GLYCOL 3350 17 G PO PACK
17.0000 g | PACK | Freq: Every day | ORAL | Status: DC | PRN
  Filled 2014-03-18: qty 1

## 2014-03-18 MED ORDER — IPRATROPIUM BROMIDE 0.02 % IN SOLN: 0.5000 mg | Freq: Four times a day (QID) | RESPIRATORY_TRACT | Status: DC | PRN

## 2014-03-18 MED ORDER — ACETAMINOPHEN 325 MG PO TABS: 650.0000 mg | ORAL_TABLET | Freq: Four times a day (QID) | ORAL | Status: DC | PRN

## 2014-03-18 MED ORDER — ENSURE COMPLETE PO LIQD
237.0000 mL | Freq: Two times a day (BID) | ORAL | Status: DC
  Administered 2014-03-18: 237 mL via ORAL

## 2014-03-18 MED ORDER — DONEPEZIL HCL 10 MG PO TABS
10.0000 mg | ORAL_TABLET | Freq: Every day | ORAL | Status: DC
Start: 2014-03-18 — End: 2014-03-25
  Administered 2014-03-19 – 2014-03-24 (×7): 10 mg via ORAL
  Filled 2014-03-18 (×7): qty 1

## 2014-03-18 MED ORDER — LORAZEPAM 0.5 MG PO TABS
0.2500 mg | ORAL_TABLET | Freq: Four times a day (QID) | ORAL | Status: DC | PRN
  Administered 2014-03-18 – 2014-03-24 (×4): 0.25 mg via ORAL
  Filled 2014-03-18 (×4): qty 1

## 2014-03-18 MED ORDER — METOPROLOL SUCCINATE ER 25 MG PO TB24
25.0000 mg | ORAL_TABLET | Freq: Every day | ORAL | Status: DC
  Administered 2014-03-18 – 2014-03-25 (×9): 25 mg via ORAL
  Filled 2014-03-18 (×9): qty 1

## 2014-03-18 MED ORDER — SODIUM CHLORIDE 0.9 % IJ SOLN
3.0000 mL | Freq: Two times a day (BID) | INTRAMUSCULAR | Status: DC
  Administered 2014-03-20 – 2014-03-24 (×5): 3 mL via INTRAVENOUS

## 2014-03-18 MED ORDER — ESCITALOPRAM OXALATE 10 MG PO TABS
10.0000 mg | ORAL_TABLET | Freq: Every day | ORAL | Status: DC
  Administered 2014-03-18 – 2014-03-25 (×8): 10 mg via ORAL
  Filled 2014-03-18 (×9): qty 1

## 2014-03-18 MED ORDER — ONDANSETRON HCL 4 MG/2ML IJ SOLN: 4.0000 mg | Freq: Four times a day (QID) | INTRAMUSCULAR | Status: DC | PRN

## 2014-03-18 MED ORDER — ASPIRIN EC 81 MG PO TBEC
81.0000 mg | DELAYED_RELEASE_TABLET | Freq: Every day | ORAL | Status: DC
  Administered 2014-03-18 – 2014-03-25 (×8): 81 mg via ORAL
  Filled 2014-03-18 (×8): qty 1

## 2014-03-18 MED ORDER — ALBUTEROL SULFATE (2.5 MG/3ML) 0.083% IN NEBU: 2.5000 mg | INHALATION_SOLUTION | Freq: Four times a day (QID) | RESPIRATORY_TRACT | Status: DC | PRN

## 2014-03-18 NOTE — Evaluation (Signed)
Physical Therapy Evaluation Patient Details Name: [name] MRN: [id number] DOB: 13-Mar-1923 Today's Date: 03/18/2014   History of Present Illness  80 yo female admitted with Pna, weakness. Hx of NHL, splenectomy, COPD, dementia, osteoporosis. Pt is from ALF  Clinical Impression  On eval, pt required Mod assist for bed mobility and Min assist for transfers and a few steps in room with walker. No family present at time of eval and pt was unable to provide PLOF to therapist. Recommend HHPT at ALF as long as facility can provide current level of assist. If facility is unable, pt may need ST rehab at [facility].     Follow Up Recommendations Home health PT;Supervision/Assistance - 24 hour (at ALF as long as facility can provide current level of care. If facility is unable, then pt may need SNF. )    Equipment Recommendations       Recommendations for Other Services OT consult     Precautions / Restrictions Precautions Precautions: Fall Restrictions Weight Bearing Restrictions: No      Mobility  Bed Mobility Overal bed mobility: Needs Assistance Bed Mobility: Supine to Sit     Supine to sit: HOB elevated;Mod assist     General bed mobility comments: assist for bil LEs and trunk to upright. Multimodal cues for safety, technique, hand placement. Increased time. Utillized bedpad for scooting, positioning.   Transfers Overall transfer level: Needs assistance Equipment used: Rolling walker (2 wheeled) Transfers: Sit to/from [facility] Sit to Stand: Min assist Stand pivot transfers: Min assist       General transfer comment: x3 Assist to rise, stabilize, control descent. Multimodal cues for safety, technique. Pt incontinent of bladder before she could make it to [facility]  Ambulation/Gait   Ambulation Distance (Feet): 5 Feet Assistive device: Rolling walker (2 wheeled) Gait Pattern/deviations: Step-through pattern;Trunk flexed;Decreased stride length      General Gait Details: slow gait speed. pt fatigues easily. remained on [place] O2 for short distance.   Stairs            Wheelchair Mobility    Modified Rankin (Stroke Patients Only)       Balance                                             Pertinent Vitals/Pain Pain Assessment: Faces Faces Pain Scale: Hurts little more Pain Location: R lower leg/foot when donning sock Pain Intervention(s): Monitored during session    Home Living Family/patient expects to be discharged to:: Assisted living                 Additional Comments: Pt unable to provide PLOF/home environment info. Unsure if this is due to dementia or hearing deficits.     Prior Function Level of Independence: Needs assistance               Hand Dominance        Extremity/Trunk Assessment   Upper Extremity Assessment: Generalized weakness           Lower Extremity Assessment: Generalized weakness      Cervical / Trunk Assessment: Kyphotic  Communication   Communication: HOH  Cognition Arousal/Alertness: Awake/alert Behavior During Therapy: WFL for tasks assessed/performed Overall Cognitive Status: History of cognitive impairments - at baseline  General Comments      Exercises        Assessment/Plan    PT Assessment Patient needs continued PT services  PT Diagnosis Difficulty walking;Generalized weakness   PT Problem List Decreased strength;Decreased activity tolerance;Decreased balance;Decreased mobility;Decreased knowledge of use of DME;Decreased cognition;Pain  PT Treatment Interventions DME instruction;Gait training;Functional mobility training;Therapeutic exercise;Therapeutic activities;Patient/family education;Balance training   PT Goals (Current goals can be found in the Care Plan section) Acute Rehab PT Goals Patient Stated Goal: none stated PT Goal Formulation: Patient unable to participate in goal setting Time For  Goal Achievement: 04/01/14 Potential to Achieve Goals: Fair    Frequency Min 3X/week   Barriers to discharge        Co-evaluation               End of Session Equipment Utilized During Treatment: Gait belt Activity Tolerance: Patient limited by fatigue Patient left: in chair;with call bell/phone within reach;with chair alarm set           Time: [phone] PT Time Calculation (min): 25 min   Charges:   PT Evaluation $Initial PT Evaluation Tier I: 1 Procedure PT Treatments $Therapeutic Activity: 23-37 mins   PT G Codes:          Weston Anna, MPT Pager: [phone]

## 2014-03-18 NOTE — Care Management Note (Addendum)
    Page 1 of 2   03/25/2014     11:41:55 AM CARE MANAGEMENT NOTE 03/25/2014  Patient:  [name], [name]   Account Number:  [id number]  Date Initiated:  03/18/2014  Documentation initiated by:  Dessa Phi  Subjective/Objective Assessment:   80 Y/O F SOB.PNA.HX:NHL.     Action/Plan:   FROM [facility]-ALF.   Anticipated DC Date:  03/25/2014   Anticipated DC Plan:  [place]  CM consult      Choice offered to / List presented to:  C-1 Patient        [place] arranged  HH-2 PT      [facility] agency  [facility]   Status of service:  Completed, signed off Medicare Important Message given?  YES (If response is "NO", the following Medicare IM given date fields will be blank) Date Medicare IM given:  03/21/2014 Medicare IM given by:  Dessa Phi Date Additional Medicare IM given:  03/25/2014 Additional Medicare IM given by:  Dessa Phi  Discharge Disposition:  [place]  Per UR Regulation:  Reviewed for med. necessity/level of care/duration of stay  If discussed at [place] of Stay Meetings, dates discussed:    Comments:  03/25/14 Jomarie Gellis RN,BSN NCM [place] [facility] @ ALF.PER NSG 02 SATS DOES NOT QUALIFY FOR HOME 02,NOTED IN V/S SECTION.NO HOME 02 ORDERS.CSW MANAGING RETURN BACK TO ALF.  03/22/14 Margalit Leece RN,BSN NCM [phone] QUALIFY FOR HOME 02(SATS ONLY GOOD FOR 24-48HRS)-[facility] DME REP AWARE & FOLLOWING-TO TAKE HOME 02 TO [facility] SINCE PATIENT WILL TRANSP BACK VIA [facility](THEY WILL HAVE O2).HHPT CAN BE DONE BY ALF.  03/21/14 Gabriell Daigneault RN,BSN NCM [phone] NOTED 02 SATS.IF NEEDED CAN ARRANGE W/QUALIFYING SATS, & ORDERED.D/C PLAN RETURN TO ALF(THEY CAN PROVIDE OWN HHPT-PUT ON FL2.  03/18/14 Nalia Honeycutt RN,BSN NCM [phone] PT-HHPT.AWAIT FINAL HHPT ORDER.WILL CONFIRM [facility]HC AGENCY OF CHOICE.

## 2014-03-18 NOTE — Progress Notes (Signed)
CRITICAL VALUE ALERT  Critical value received:  Aerobic bottle from blood cultures drawn on 03/17/14 is growing gram positive cocci in pairs  Date of notification:  03/18/2014   Time of notification:  1150  Critical value read back: yes  Nurse who received alert: Kaylyn Layer   MD notified (1st page):  Dr. Forde Dandy  Time of first page:  52  MD notified (2nd page):  Time of second page:  Responding MD:  Dr. Forde Dandy  Time MD responded:  1214 per note in [place]

## 2014-03-18 NOTE — Progress Notes (Signed)
ANTIBIOTIC CONSULT NOTE - INITIAL  Pharmacy Consult for vancomycin, aztreonam Indication: pneumonia  Allergies  Allergen Reactions  . Iodine     Fever , chills  . Penicillins     Unknown   . Shellfish-Derived Products     Unknown     Patient Measurements: Height: 5\' 7"  (170.2 cm) Weight: 83 lb (37.649 kg) IBW/kg (Calculated) : 61.6 Adjusted Body Weight:   Vital Signs: Temp: 98.4 F (36.9 C) (11/02 0208) Temp Source: Oral (11/02 0208) BP: 156/84 mmHg (11/02 0208) Pulse Rate: 109 (11/02 0208) Intake/Output from previous day:   Intake/Output from this shift:    Labs:  Recent Labs  03/17/14 1952  WBC 28.4*  HGB 13.0  PLT 307  CREATININE 0.93   Estimated Creatinine Clearance: 24.3 mL/min (by C-G formula based on Cr of 0.93). No results for input(s): VANCOTROUGH, VANCOPEAK, VANCORANDOM, GENTTROUGH, GENTPEAK, GENTRANDOM, TOBRATROUGH, TOBRAPEAK, TOBRARND, AMIKACINPEAK, AMIKACINTROU, AMIKACIN in the last 72 hours.   Microbiology: No results found for this or any previous visit (from the past 720 hour(s)).  Medical History: Past Medical History  Diagnosis Date  . Cancer   . Lymphoma, high grade 07/23/2011  . Benign essential HTN 07/23/2011  . Hyperlipidemia 07/23/2011  . 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 DJD (degenerative joint disease), lumbar 07/23/2011  . Current non-smoker but past smoking history unknown 12/08/12  . COPD (chronic obstructive pulmonary disease)     Medications:  Anti-infectives    Start     Dose/Rate Route Frequency Ordered Stop   03/17/14 2230  vancomycin (VANCOCIN) IVPB 1000 mg/200 mL premix     1,000 mg200 mL/hr over 60 Minutes Intravenous  Once 03/17/14 2217 03/18/14 0133   03/17/14 2215  aztreonam (AZACTAM) 2 g in dextrose 5 % 50 mL IVPB     2 g100 mL/hr over 30 Minutes Intravenous  Once 03/17/14 2211 03/18/14 0032     Assessment: Patient with HCAP and cancer.  Patient with poor renal function.  Goal of Therapy:  Vancomycin  trough level 15-20 mcg/ml  Aztreonam dosed based on patient weight and renal function   Plan:  Measure antibiotic drug levels at steady state Follow up culture results vancomycin by level, next level at 0800 11/3  Aztreonam 1gm iv q8hr  Tyler Deis, Shea Stakes [place] 03/18/2014,2:23 AM

## 2014-03-18 NOTE — Plan of Care (Signed)
Problem: Phase I Progression Outcomes Goal: Hemodynamically stable Outcome: Completed/Met Date Met:  03/18/14 Goal: Flu/PneumoVaccines if indicated Outcome: Completed/Met Date Met:  03/18/14 Goal: Pain controlled Outcome: Completed/Met Date Met:  03/18/14 Goal: Progress activity as tolerated unless otherwise ordered Outcome: Completed/Met Date Met:  03/18/14 Goal: Discharge plan established Outcome: Completed/Met Date Met:  03/18/14 Goal: Tolerating diet Outcome: Completed/Met Date Met:  03/18/14

## 2014-03-18 NOTE — Progress Notes (Signed)
INITIAL NUTRITION ASSESSMENT  DOCUMENTATION CODES Per approved criteria  -Severe malnutrition in the context of chronic illness -Underweight  Pt meets criteria for severe MALNUTRITION in the context of chronic illness as evidenced by severe muscle wasting and subcutaneous fat loss, PO intake < 75% for > one month.   INTERVENTION: -Recommend to increase Ensure Complete to TID -Consider addition of snacks (crackers and peanut butter or yogurt with fruit) -Consider use of appetite stimulant as warranted -RD to continue to monitor  NUTRITION DIAGNOSIS: Increased nutrient needs (protein/kcal) related to increase demand for nutrients as evidenced by BMI < 18.5.   Goal: Pt to meet >/= 90% of their estimated nutrition needs    Monitor:  Total protein/energy intake, labs, weights  Reason for Assessment: Consult to Assess/MST  80 y.o. female  Admitting Dx: <principal problem not specified>  ASSESSMENT: [name] is an 80 year old white female with a history of non-Hodgkin's lymphoma status post splenectomy, COPD, and dementia who presented to the emergency department with a complaint of cough and weakness  -Pt confused this morning, was not able to obtain detailed food hx -Reported consuming 1-2 meals daily; skips breakfast and will eat salads or something light (soups/sandwiches) for lunch and dinner.  -Denied use of nutrition supplements at home.MD ordered EC BID. RD noted pt had consumed > 75% of supplement during time of RD assessment. Will increase to TID and assess tolerance.  -Pt appeared to be interested in snacks to use as warranted; declined at this time. Noted to enjoy crackers, fruit and yogurt -Current PO intake 50%, pt denied any nausea, abd pain or difficulty tolerating diet texture -Weight stable from previous admit; however weight records indicate a gradual 10 lb weight over past two years, liklely r/t progressive decline and aging process. Has evident bitemporal  wasting and severe clavicle and acromion wasting  Height: Ht Readings from Last 1 Encounters:  03/18/14 5\' 7"  (1.702 m)    Weight: Wt Readings from Last 1 Encounters:  03/18/14 83 lb 5.3 oz (37.8 kg)    Ideal Body Weight: 135 lb  % Ideal Body Weight: 61%  Wt Readings from Last 10 Encounters:  03/18/14 83 lb 5.3 oz (37.8 kg)  04/18/13 83 lb 6.4 oz (37.83 kg)  04/09/13 78 lb (35.381 kg)  12/11/12 86 lb 12.8 oz (39.372 kg)  10/04/12 92 lb 4.8 oz (41.867 kg)  01/24/12 94 lb 4.8 oz (42.774 kg)    Usual Body Weight: unable to determine  % Usual Body Weight: unable to determine  BMI:  Body mass index is 13.05 kg/(m^2).  Estimated Nutritional Needs: Kcal: 1350-1550 Protein: 55-65 gram Fluid: >/= 1400 ml daily  Skin: WDL  Diet Order: Diet regular  EDUCATION NEEDS: -No education needs identified at this time  No intake or output data in the 24 hours ending 03/18/14 1133  Last BM: PTA   Labs:   Recent Labs Lab 03/17/14 1952 03/18/14 0448  NA 136* 138  K 4.7 4.1  CL 96 98  CO2 29 30  BUN 38* 39*  CREATININE 0.93 0.81  CALCIUM 11.0* 10.2  GLUCOSE 153* 109*    CBG (last 3)  No results for input(s): GLUCAP in the last 72 hours.  Scheduled Meds: . aspirin EC  81 mg Oral Daily  . aztreonam  1 g Intravenous 3 times per day  . cholecalciferol  1,000 Units Oral Daily  . docusate sodium  100 mg Oral Daily  . donepezil  10 mg Oral QHS  .  enoxaparin (LOVENOX) injection  30 mg Subcutaneous Q24H  . escitalopram  10 mg Oral Daily  . feeding supplement (ENSURE COMPLETE)  237 mL Oral BID BM  . fenofibrate  80 mg Oral Daily  . ferrous sulfate  325 mg Oral BID  . metoprolol succinate  25 mg Oral Daily  . multivitamin-prenatal  1 tablet Oral Daily  . [START ON 03/19/2014] pneumococcal 23 valent vaccine  0.5 mL Intramuscular Tomorrow-1000  . potassium chloride SA  20 mEq Oral Daily  . pregabalin  100 mg Oral QHS  . risperiDONE  0.5 mg Oral QHS  . sodium chloride  3  mL Intravenous Q12H    Continuous Infusions: . sodium chloride 75 mL/hr at 03/18/14 0415    Past Medical History  Diagnosis Date  . Cancer   . Lymphoma, high grade 07/23/2011  . Benign essential HTN 07/23/2011  . Hyperlipidemia 07/23/2011  . 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 DJD (degenerative joint disease), lumbar 07/23/2011  . Current non-smoker but past smoking history unknown 12/08/12  . COPD (chronic obstructive pulmonary disease)     Past Surgical History  Procedure Laterality Date  . Colon surgery    . Appendectomy    . Abdominal hysterectomy    . Hernia repair    . Tonsillectomy    . Splenectomy    . Shoulder surgery    . Hemorroidectomy    . Esophagogastroduodenoscopy N/A 12/08/2012    Procedure: ESOPHAGOGASTRODUODENOSCOPY (EGD);  Surgeon: Wonda Horner, MD;  Location: [facility];  Service: Endoscopy;  Laterality: N/A;  . Esophagogastroduodenoscopy N/A 04/10/2013    Procedure: ESOPHAGOGASTRODUODENOSCOPY (EGD);  Surgeon: Winfield Cunas., MD;  Location: [facility];  Service: Endoscopy;  Laterality: N/A;    Atlee Abide MS RD LDN Clinical Dietitian [phone]

## 2014-03-18 NOTE — H&P (Signed)
PCP:   Sheela Stack, MD   Chief Complaint:  cough  HPI: [name] is an 80 year old white female with a history of non-Hodgkin's lymphoma status post splenectomy, COPD, and dementia who presented to the emergency department with a complaint of cough and weakness. History is limited by the patient's dementia. Per discussion with the emergency room physician she developed increasing weakness, cough, and congestion over the past several days. Her appetite has been poor with decreased by mouth intake. He is brought to the emergency department where her temperature is 99.4, white blood count 28.4, and chest x-ray showsairspace disease in the left upper lung and possible small left pleural effusion Initial O2 sats were 84% on room air and her improved to 93% on 3 L.she has received a dose of Aztreonam in the emergency department and is subtotally receiving a dose of vancomycin. We are called for admission for treatment of pneumonia.  Review of Systems:  Review of Systems - review of systems limited by the patient's dementia. She denies any specific complaints currently.daughter is not present at this time.  Past Medical History: Past Medical History  Diagnosis Date  . Cancer   . Lymphoma, high grade 07/23/2011  . Benign essential HTN 07/23/2011  . Hyperlipidemia 07/23/2011  . 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 DJD (degenerative joint disease), lumbar 07/23/2011  . Current non-smoker but past smoking history unknown 12/08/12  . COPD (chronic obstructive pulmonary disease)    Past Surgical History  Procedure Laterality Date  . Colon surgery    . Appendectomy    . Abdominal hysterectomy    . Hernia repair    . Tonsillectomy    . Splenectomy    . Shoulder surgery    . Hemorroidectomy    . Esophagogastroduodenoscopy N/A 12/08/2012    Procedure: ESOPHAGOGASTRODUODENOSCOPY (EGD);  Surgeon: Wonda Horner, MD;  Location: [facility];  Service: Endoscopy;  Laterality: N/A;  .  Esophagogastroduodenoscopy N/A 04/10/2013    Procedure: ESOPHAGOGASTRODUODENOSCOPY (EGD);  Surgeon: Winfield Cunas., MD;  Location: [facility];  Service: Endoscopy;  Laterality: N/A;    Medications: Prior to Admission medications   Medication Sig Start Date End Date Taking? Authorizing Provider  acetaminophen (TYLENOL) 500 MG tablet Take 500 mg by mouth every 6 (six) hours as needed for mild pain.    Yes Historical Provider, MD  aspirin 81 MG tablet Take 81 mg by mouth daily.   Yes Historical Provider, MD  Cholecalciferol (VITAMIN D PO) Take 1 tablet by mouth daily.   Yes Historical Provider, MD  docusate sodium (COLACE) 100 MG capsule Take 100 mg by mouth daily.   Yes Historical Provider, MD  donepezil (ARICEPT) 10 MG tablet Take 10 mg by mouth at bedtime.   Yes Historical Provider, MD  escitalopram (LEXAPRO) 10 MG tablet Take 10 mg by mouth daily.   Yes Historical Provider, MD  fenofibrate 160 MG tablet Take 80 mg by mouth daily.  06/02/11  Yes Historical Provider, MD  ferrous sulfate 325 (65 FE) MG tablet Take 325 mg by mouth 2 (two) times daily.   Yes Historical Provider, MD  LORazepam (ATIVAN) 0.5 MG tablet Take 0.25 mg by mouth every 6 (six) hours as needed for anxiety.   Yes Historical Provider, MD  LYRICA 100 MG capsule Take 100 mg by mouth at bedtime.  07/09/11  Yes Historical Provider, MD  metoprolol succinate (TOPROL-XL) 25 MG 24 hr tablet Take 25 mg by mouth daily.   Yes Historical Provider, MD  Nutritional  Supplements (ENSURE PO) Take 1 Can by mouth 3 (three) times daily. Strawberry   Yes Historical Provider, MD  oxyCODONE-acetaminophen (PERCOCET/ROXICET) 5-325 MG per tablet Take 1 tablet by mouth every 8 (eight) hours as needed for severe pain. Patient taking differently: Take 1 tablet by mouth 2 (two) times daily.  10/19/13  Yes Domenic Moras, PA-C  oxyCODONE-acetaminophen (PERCOCET/ROXICET) 5-325 MG per tablet Take 1 tablet by mouth daily as needed for moderate pain or severe  pain.   Yes Historical Provider, MD  potassium chloride SA (K-DUR,KLOR-CON) 20 MEQ tablet Take 20 mEq by mouth daily.   Yes Historical Provider, MD  Prenatal Vit-Fe Fumarate-FA (MULTIVITAMIN-PRENATAL) 27-0.8 MG TABS Take 1 tablet by mouth daily.   Yes Historical Provider, MD  risperiDONE (RISPERDAL) 0.5 MG tablet Take 0.5 mg by mouth at bedtime.   Yes Historical Provider, MD  digoxin (LANOXIN) 0.125 MG tablet Take 125 mcg by mouth daily.  06/10/11   Historical Provider, MD  fish oil-omega-3 fatty acids 1000 MG capsule Take 1 g by mouth daily.    Historical Provider, MD  lansoprazole (PREVACID) 15 MG capsule Take 15 mg by mouth daily as needed (acid reflux).     Historical Provider, MD  risperiDONE (RISPERDAL) 0.25 MG tablet Take 0.25 mg by mouth at bedtime.    Historical Provider, MD    Allergies:   Allergies  Allergen Reactions  . Iodine     Fever , chills  . Penicillins     Unknown   . Shellfish-Derived Products     Unknown     Social History:  reports that she has never smoked. She has never used smokeless tobacco. She reports that she does not drink alcohol or use illicit drugs.  Resident of [facility].  Family History: Family History  Problem Relation Age of Onset  . Cancer - Other Mother     liver  . Stroke Father   . Cancer Brother     prostate/colon  . Dementia Brother   . Dementia Brother     Physical Exam: Filed Vitals:   03/17/14 1857 03/17/14 2136 03/17/14 2243  BP:  149/72   Pulse: 80 107   Temp: 98.8 F (37.1 C) 99.4 F (37.4 C)   TempSrc:  Rectal   Resp: 18    Height:   5\' 7"  (1.702 m)  Weight:   37.649 kg (83 lb)  SpO2: 93% 95%    General appearance: frail, thin in no acute distress; hard of hearing, disoriented Head: Normocephalic, without obvious abnormality, atraumatic Eyes: conjunctivae/corneas clear. PERRL, EOM's intact.  Nose: Nares normal. Septum midline. Mucosa normal. No drainage or sinus tenderness. Throat: lips, mucosa, and tongue  normal; teeth and gums normal Neck: no adenopathy, no carotid bruit, no JVD and thyroid not enlarged, symmetric, no tenderness/mass/nodules Resp: left>right rhonchi Cardio: regular rate and rhythm GI: soft, non-tender; bowel sounds normal; no masses,  no organomegaly Extremities: extremities normal, atraumatic, no cyanosis or edema Pulses: 2+ and symmetric Lymph nodes:  no cervical lymphadenopathy Neurologic:  Slight word finding difficulties; apparent dementia.   Labs on Admission:   Recent Labs  03/17/14 1952  NA 136*  K 4.7  CL 96  CO2 29  GLUCOSE 153*  BUN 38*  CREATININE 0.93  CALCIUM 11.0*   No results for input(s): AST, ALT, ALKPHOS, BILITOT, PROT, ALBUMIN in the last 72 hours.   Recent Labs  03/17/14 1952  WBC 28.4*  NEUTROABS 25.5*  HGB 13.0  HCT 38.7  MCV 88.4  PLT 307    Lab Results  Component Value Date   INR 1.18 03/17/2014   INR 1.34 12/07/2012   INR 2.60* 07/24/2009   BNP    Component Value Date/Time   PROBNP 14687.0* 03/17/2014 2126     Radiological Exams on Admission: Dg Chest 2 View  03/17/2014   CLINICAL DATA:  Productive cough. Low oxygen saturations. History of COPD.  EXAM: CHEST  2 VIEW  COMPARISON:  04/02/2013 and 07/21/2009  FINDINGS: Two views of the chest demonstrate hyperinflation. There are new densities in the left upper lung. There is also blunting at the left costophrenic angle which is new. Images of the left upper lung are slightly limited due to patient rotation. Heart size is within normal limits. Chronic linear densities in the anterior chest.  IMPRESSION: Airspace disease in the left upper lung and suspect a small left pleural effusion. Findings are most compatible with pneumonia. Recommend follow up to ensure resolution.   Electronically Signed   By: Markus Daft M.D.   On: 03/17/2014 19:47   Orders placed or performed during the hospital encounter of 03/17/14  . EKG 12-Lead  . EKG 12-Lead  . EKG 12-Lead  . EKG 12-Lead     Assessment/Plan Active Problems: 1.  Healthcare-associated pneumonia- fever, leukocytosis, and chest x-ray findings are consistent with pneumonia. Will cover for healthcare associated organisms given residence at an assisted living facility. She is allergic to penicillin so will continue vancomycin and Aztreonam that was started in the emergency department. Consider transition to oral Levaquin if stable.  Continue oxygen and wean to keep sats above 90%. She is not wheezing currently but will use albuterol and Atrovent nebulizers as needed.  Although she is at increased risk for aspiration with her dementia, chest x-ray appearance is not suggestive of this this. 2. History of Non Hodgkin's lymphoma s/p splenectomy- she is at increased risk for infection with encapsulated organisms including strep pneumonia due to prior splenectomy. No evidence of disease on most recent follow-up. She is followed by oncology periodically. 3. Protein-calorie malnutrition, severe-we'll continue ensure supplementation. Nutrition consult. 4. Hypercalcemia-this appears to be a chronic issue and is stable. Monitor with hydration. 5. Elevated BNP-no evidence of clinical heart failure on exam or chest x-ray. She appears slightly hypovolemic based on elevated BUN. Monitor with hydration. 6. Disposition-anticipate discharge back to [facility] facility once stable and on oral antibiotics.  Marton Redwood 03/18/2014, 12:17 AM

## 2014-03-18 NOTE — Plan of Care (Signed)
Problem: ICU Phase Progression Outcomes Goal: Voiding-avoid urinary catheter unless indicated Outcome: Completed/Met Date Met:  03/18/14

## 2014-03-18 NOTE — Progress Notes (Signed)
Clinical Social Work Department BRIEF PSYCHOSOCIAL ASSESSMENT 03/18/2014  Patient:  [name], [name]     Account Number:  [id number]     Admit date:  03/17/2014  Clinical Social Worker:  Renold Genta  Date/Time:  03/18/2014 04:03 PM  Referred by:  Physician  Date Referred:  03/18/2014 Referred for  Other - See comment   Other Referral:   Admitted from: [facility]   Interview type:  Family Other interview type:   patient's daughter, [name] via phone    PSYCHOSOCIAL DATA Living Status:  FACILITY Admitted from facility:  [facility] Level of care:  Assisted Living Primary support name:  Blima Singer (daughter) h#: [phone] c#: [phone] Primary support relationship to patient:  CHILD, ADULT Degree of support available:   good    CURRENT CONCERNS Current Concerns  Post-Acute Placement   Other Concerns:    SOCIAL WORK ASSESSMENT / PLAN CSW received consult that patient was admitted from [facility].   Assessment/plan status:  Information/Referral to [employer] Other assessment/ plan:   Information/referral to community resources:   CSW completed FL2 and sent information to [facility].    PATIENT'S/FAMILY'S RESPONSE TO PLAN OF CARE: CSW confirmed with patient's daughter, [name] that she plans for her to return to [facility] at discharge. Daughter states that she was recently moved from ALF to the Memory Care Unit. CSW confirmed with [facility] that they should be able to take patient back when ready, pending clinical review.       Raynaldo Opitz, [facility] Clinical Social Worker cell #: [phone]

## 2014-03-18 NOTE — Progress Notes (Signed)
Subjective: No complaints this AM> denies SOB   Objective: Vital signs in last 24 hours: Temp:  [98.4 F (36.9 C)-99.4 F (37.4 C)] 98.4 F (36.9 C) (11/02 0451) Pulse Rate:  [80-109] 100 (11/02 0451) Resp:  [18-20] 18 (11/02 0451) BP: (148-166)/(72-101) 148/78 mmHg (11/02 0451) SpO2:  [93 %-97 %] 95 % (11/02 0451) Weight:  [37.649 kg (83 lb)-37.8 kg (83 lb 5.3 oz)] 37.8 kg (83 lb 5.3 oz) (11/02 0451)  Intake/Output from previous day:   Intake/Output this shift:    Thin chronically ill but nontoxic WF, bitemporal wasting, anicteric, few left side rhonchi. Ht IR IR with loud S2, abd thin soft NT, extrems thin with no edema. Awake, very confused, no tremor, calm  Lab Results   Recent Labs  03/17/14 1952 03/18/14 0448  WBC 28.4* 27.3*  RBC 4.38 3.83*  HGB 13.0 11.2*  HCT 38.7 35.1*  MCV 88.4 91.6  MCH 29.7 29.2  RDW 18.2* 18.0*  PLT 307 281    Recent Labs  03/17/14 1952 03/18/14 0448  NA 136* 138  K 4.7 4.1  CL 96 98  CO2 29 30  GLUCOSE 153* 109*  BUN 38* 39*  CREATININE 0.93 0.81  CALCIUM 11.0* 10.2    Studies/Results: Dg Chest 2 View  03/17/2014   CLINICAL DATA:  Productive cough. Low oxygen saturations. History of COPD.  EXAM: CHEST  2 VIEW  COMPARISON:  04/02/2013 and 07/21/2009  FINDINGS: Two views of the chest demonstrate hyperinflation. There are new densities in the left upper lung. There is also blunting at the left costophrenic angle which is new. Images of the left upper lung are slightly limited due to patient rotation. Heart size is within normal limits. Chronic linear densities in the anterior chest.  IMPRESSION: Airspace disease in the left upper lung and suspect a small left pleural effusion. Findings are most compatible with pneumonia. Recommend follow up to ensure resolution.   Electronically Signed   By: Markus Daft M.D.   On: 03/17/2014 19:47    Scheduled Meds: . aspirin EC  81 mg Oral Daily  . aztreonam  1 g Intravenous 3 times per day  .  cholecalciferol  1,000 Units Oral Daily  . docusate sodium  100 mg Oral Daily  . donepezil  10 mg Oral QHS  . enoxaparin (LOVENOX) injection  30 mg Subcutaneous Q24H  . escitalopram  10 mg Oral Daily  . feeding supplement (ENSURE COMPLETE)  237 mL Oral BID BM  . fenofibrate  80 mg Oral Daily  . ferrous sulfate  325 mg Oral BID  . metoprolol succinate  25 mg Oral Daily  . multivitamin-prenatal  1 tablet Oral Daily  . potassium chloride SA  20 mEq Oral Daily  . pregabalin  100 mg Oral QHS  . risperiDONE  0.5 mg Oral QHS  . sodium chloride  3 mL Intravenous Q12H   Continuous Infusions: . sodium chloride 75 mL/hr at 03/18/14 0415   PRN Meds:acetaminophen **OR** acetaminophen, albuterol, alum & mag hydroxide-simeth, ipratropium, LORazepam, ondansetron **OR** ondansetron (ZOFRAN) IV, oxyCODONE-acetaminophen, polyethylene glycol  Assessment/Plan: LEFT UPPER LOBE HCAP:on combo Rx with vanco and aztreonam. May be able to use levaquin alone when cx return. Has defervesced but WBC still up DEMENTIA, ADVANCED: about the same as when I last saw her many months ago AFIB: stable rate, off anticoag due to GI bleeding NON HODGKINS LYMPHOMA: seems OK ADULT FTT/MALNUTRITION: looks progressive HYPERLIPIDEMIA: on Rx DEPRESSION: seems OK  GOITER: check TSH  CODE  STATUS: will talk with dtr  LOS: 1 day   [name] 03/18/2014, 8:08 AM

## 2014-03-18 NOTE — Progress Notes (Signed)
Patient ID: [name], female   DOB: 21-Dec-1922, 80 y.o.   MRN: [id number]  Called with [facility] in [facility], on [place] MD [place]

## 2014-03-19 ENCOUNTER — Inpatient Hospital Stay (HOSPITAL_COMMUNITY): Payer: Medicare Other

## 2014-03-19 DIAGNOSIS — J13 Pneumonia due to Streptococcus pneumoniae: Secondary | ICD-10-CM | POA: Diagnosis not present

## 2014-03-19 LAB — BLOOD GAS, ARTERIAL
ACID-BASE EXCESS: 1.7 mmol/L (ref 0.0–2.0)
BICARBONATE: 28.5 meq/L — AB (ref 20.0–24.0)
DRAWN BY: 295031
O2 CONTENT: 3 L/min
O2 Saturation: 98 %
PCO2 ART: 58.4 mmHg — AB (ref 35.0–45.0)
PO2 ART: 106 mmHg — AB (ref 80.0–100.0)
Patient temperature: 98.6
TCO2: 26.3 mmol/L (ref 0–100)
pH, Arterial: 7.31 — ABNORMAL LOW (ref 7.350–7.450)

## 2014-03-19 LAB — CBC
HCT: 31.9 % — ABNORMAL LOW (ref 36.0–46.0)
HEMOGLOBIN: 10 g/dL — AB (ref 12.0–15.0)
MCH: 29 pg (ref 26.0–34.0)
MCHC: 31.3 g/dL (ref 30.0–36.0)
MCV: 92.5 fL (ref 78.0–100.0)
PLATELETS: 268 10*3/uL (ref 150–400)
RBC: 3.45 MIL/uL — AB (ref 3.87–5.11)
RDW: 17.8 % — ABNORMAL HIGH (ref 11.5–15.5)
WBC: 10.5 10*3/uL (ref 4.0–10.5)

## 2014-03-19 LAB — COMPREHENSIVE METABOLIC PANEL
ALT: 14 U/L (ref 0–35)
AST: 27 U/L (ref 0–37)
Albumin: 1.9 g/dL — ABNORMAL LOW (ref 3.5–5.2)
Alkaline Phosphatase: 67 U/L (ref 39–117)
Anion gap: 6 (ref 5–15)
BUN: 34 mg/dL — ABNORMAL HIGH (ref 6–23)
CALCIUM: 10 mg/dL (ref 8.4–10.5)
CHLORIDE: 103 meq/L (ref 96–112)
CO2: 30 mEq/L (ref 19–32)
Creatinine, Ser: 0.67 mg/dL (ref 0.50–1.10)
GFR calc Af Amer: 88 mL/min — ABNORMAL LOW (ref >90)
GFR calc non Af Amer: 76 mL/min — ABNORMAL LOW (ref >90)
Glucose, Bld: 86 mg/dL (ref 70–99)
Potassium: 4.6 mEq/L (ref 3.7–5.3)
Sodium: 139 mEq/L (ref 137–147)
Total Bilirubin: 0.3 mg/dL (ref 0.3–1.2)
Total Protein: 5.7 g/dL — ABNORMAL LOW (ref 6.0–8.3)

## 2014-03-19 LAB — VANCOMYCIN, TROUGH: Vancomycin Tr: <5 ug/mL — ABNORMAL LOW (ref 10.0–20.0)

## 2014-03-19 MED ORDER — VANCOMYCIN HCL IN DEXTROSE 750-5 MG/150ML-% IV SOLN
750.0000 mg | INTRAVENOUS | Status: DC
  Administered 2014-03-19 – 2014-03-20 (×2): 750 mg via INTRAVENOUS
  Filled 2014-03-19 (×2): qty 150

## 2014-03-19 MED ORDER — VANCOMYCIN HCL IN DEXTROSE 750-5 MG/150ML-% IV SOLN
750.0000 mg | Freq: Once | INTRAVENOUS | Status: DC
  Filled 2014-03-19: qty 150

## 2014-03-19 MED ORDER — METHYLPREDNISOLONE SODIUM SUCC 125 MG IJ SOLR
60.0000 mg | Freq: Four times a day (QID) | INTRAMUSCULAR | Status: DC
  Administered 2014-03-19 – 2014-03-23 (×16): 60 mg via INTRAVENOUS
  Filled 2014-03-19 (×20): qty 0.96

## 2014-03-19 MED ORDER — IPRATROPIUM-ALBUTEROL 0.5-2.5 (3) MG/3ML IN SOLN
3.0000 mL | RESPIRATORY_TRACT | Status: DC
  Administered 2014-03-19 – 2014-03-20 (×7): 3 mL via RESPIRATORY_TRACT
  Filled 2014-03-19 (×7): qty 3

## 2014-03-19 MED ORDER — SODIUM CHLORIDE 0.9 % IV SOLN: 500.0000 mg | INTRAVENOUS | Status: DC

## 2014-03-19 NOTE — Progress Notes (Signed)
CRITICAL VALUE ALERT  Critical value received:  PCO2 58.4 from arterial blood gas  Date of notification: .03/19/2014   Time of notification:  1600  Critical value read back: yes  Nurse who received alert:  Kaylyn Layer   MD notified (1st page):  Dr. Forde Dandy  Time of first page:  1615  MD notified (2nd page):  Time of second page:  Responding MD:  Dr. Forde Dandy  Time MD responded:  1625  Per RN at office, MD is aware of results. No new orders at this time. MD to contact pt's daughter.

## 2014-03-19 NOTE — Progress Notes (Signed)
Subjective: No complaints, will briefly awaken but not really coherent   Objective: Vital signs in last 24 hours: Temp:  [97.6 F (36.4 C)-98.6 F (37 C)] 97.6 F (36.4 C) (11/03 0542) Pulse Rate:  [72-103] 72 (11/03 0542) Resp:  [18-20] 18 (11/03 0542) BP: (136-163)/(66-102) 136/66 mmHg (11/03 0542) SpO2:  [90 %-97 %] 92 % (11/03 0542) Weight:  [41.5 kg (91 lb 7.9 oz)] 41.5 kg (91 lb 7.9 oz) (11/03 0542)  Intake/Output from previous day: 11/02 0701 - 11/03 0700 In: 2631.3 [P.O.:600; I.V.:1931.3; IV Piggyback:100] Out: 125 [Urine:125] Intake/Output this shift:    Non toxic, lying flat. Some left rhonchi, no accessory ms in use. Ht IR IR abd soft NT Somnolent confused  Lab Results   Recent Labs  03/18/14 0448 03/19/14 0448  WBC 27.3* 10.5  RBC 3.83* 3.45*  HGB 11.2* 10.0*  HCT 35.1* 31.9*  MCV 91.6 92.5  MCH 29.2 29.0  RDW 18.0* 17.8*  PLT 281 268    Recent Labs  03/18/14 0448 03/19/14 0448  NA 138 139  K 4.1 4.6  CL 98 103  CO2 30 30  GLUCOSE 109* 86  BUN 39* 34*  CREATININE 0.81 0.67  CALCIUM 10.2 10.0   ture Setup Time 03/18/2014 02:25  Performed at [facility]  Note: Gram Stain Report Called to,Read Back By and Verified With: JESSICA DEUTSCH 03/18/14 1055 BY SMITHERSJ  Performed at [facility]       Report Status PENDING   Results for [name], [name] (MRN [id number]) as of 03/19/2014 08:26  Ref. Range 03/18/2014 09:44  TSH Latest Range: 0.350-4.500 uIU/mL 0.545    Studies/Results: Dg Chest 2 View  03/17/2014   CLINICAL DATA:  Productive cough. Low oxygen saturations. History of COPD.  EXAM: CHEST  2 VIEW  COMPARISON:  04/02/2013 and 07/21/2009  FINDINGS: Two views of the chest demonstrate hyperinflation. There are new densities in the left upper lung. There is also blunting at the left costophrenic angle which is new. Images of the left upper lung are slightly limited due to  patient rotation. Heart size is within normal limits. Chronic linear densities in the anterior chest.  IMPRESSION: Airspace disease in the left upper lung and suspect a small left pleural effusion. Findings are most compatible with pneumonia. Recommend follow up to ensure resolution.   Electronically Signed   By: Markus Daft M.D.   On: 03/17/2014 19:47    Scheduled Meds: . aspirin EC  81 mg Oral Daily  . aztreonam  1 g Intravenous 3 times per day  . cholecalciferol  1,000 Units Oral Daily  . docusate sodium  100 mg Oral Daily  . donepezil  10 mg Oral QHS  . enoxaparin (LOVENOX) injection  30 mg Subcutaneous Q24H  . escitalopram  10 mg Oral Daily  . feeding supplement (ENSURE COMPLETE)  237 mL Oral TID BM  . fenofibrate  80 mg Oral Daily  . ferrous sulfate  325 mg Oral BID  . metoprolol succinate  25 mg Oral Daily  . multivitamin-prenatal  1 tablet Oral Daily  . pneumococcal 23 valent vaccine  0.5 mL Intramuscular Tomorrow-1000  . potassium chloride SA  20 mEq Oral Daily  . pregabalin  100 mg Oral QHS  . risperiDONE  0.5 mg Oral QHS  . sodium chloride  3 mL Intravenous Q12H   Continuous Infusions: . sodium chloride 75 mL/hr at 03/19/14 0510  PRN Meds:acetaminophen **OR** acetaminophen, albuterol, alum & mag hydroxide-simeth, HYDROcodone-homatropine, ipratropium, LORazepam, ondansetron **OR** ondansetron (ZOFRAN) IV, oxyCODONE-acetaminophen, polyethylene glycol  Assessment/Plan:  LEFT UPPER LOBE HCAP:/GPC bacteremia: Not septic or still febrile but BC already positive. Continue Vanco for now. WBC back down to 10 ANEMIA: down to 10 with hydration DEMENTIA, ADVANCED: about the same as when I last saw her many months ago AFIB: stable rate, off anticoag due to GI bleeding, will stop telemetry NON HODGKINS LYMPHOMA: seems OK ADULT FTT/MALNUTRITION: looks progressive HYPERLIPIDEMIA: on Rx DEPRESSION: seems OK  GOITER: TSH fine CODE STATUS: will talk with dtr   LOS: 2 days    Romar Woodrick [name] 03/19/2014, 8:23 AM

## 2014-03-19 NOTE — Progress Notes (Signed)
Pt noted to be more lethargic today than yesterday. Pt arousable, but does not stay awake without constant stimulation. Pt gotten up to the bedside commode to void and then to the chair to help wake her up. During this activity pt's HR was in the 120's and O2 at 81% on RA. Pt placed on 3L oxygen and O2 came up to 94%. HR came down to 103 after sitting in the chair for a few minutes. Pt's BP 185/87. Scheduled Metoprolol given. Pt's breath sounds were also more rhonchi and wheezes than this mornings assessment. Pt having DOE and at rest. MD made aware of all these findings and orders written. Will carry out orders and continue to monitor pt.  Othella Boyer [facility]

## 2014-03-19 NOTE — Plan of Care (Signed)
Problem: Phase I Progression Outcomes Goal: O2 sats > or equal 90% or at baseline Outcome: Completed/Met Date Met:  03/19/14  Problem: Phase II Progression Outcomes Goal: O2 sats > equal to 90% on RA or at baseline Outcome: Completed/Met Date Met:  03/19/14

## 2014-03-19 NOTE — Progress Notes (Signed)
ANTIBIOTIC CONSULT NOTE - FOLLOW UP  Pharmacy Consult for Vancomycin, Aztreonam Indication: pneumonia and GPC bacteremia  Allergies  Allergen Reactions  . Iodine     Fever , chills  . Penicillins     Unknown   . Shellfish-Derived Products     Unknown     Patient Measurements: Height: 5\' 7"  (170.2 cm) Weight: 91 lb 7.9 oz (41.5 kg) IBW/kg (Calculated) : 61.6  Vital Signs: Temp: 97.6 F (36.4 C) (11/03 0542) Temp Source: Oral (11/03 0542) BP: 136/66 mmHg (11/03 0542) Pulse Rate: 72 (11/03 0542) Intake/Output from previous day: 11/02 0701 - 11/03 0700 In: 2631.3 [P.O.:600; I.V.:1931.3; IV Piggyback:100] Out: 125 [Urine:125] Intake/Output from this shift:    Labs:  Recent Labs  03/17/14 1952 03/18/14 0448 03/19/14 0448  WBC 28.4* 27.3* 10.5  HGB 13.0 11.2* 10.0*  PLT 307 281 268  CREATININE 0.93 0.81 0.67   Estimated Creatinine Clearance: 31.2 mL/min (by C-G formula based on Cr of 0.67).  Recent Labs  03/19/14 0756  VANCOTROUGH <5.0*      Assessment: 33 yoF with non-Hodgkin's lymphoma status post splenectomy, COPD, and dementia who presented to the emergency department with a complaint of cough and weakness. Aztreonam and Vanc per pharmacy for HCAP.  Blood cultures growing GPC in pairs now.  11/2 >> aztreonam >> 11/2 >> vanc >>  Tmax: 99.4 WBC: improved significantly to wnl Renal: SCr 0.67, CrCl~30 ml/min (CG, using adjusted SCr 0.8)  Mirco: 11/1: blood x 2: 2/2 GPC in pairs  Levels/dose adjustments: 11/2 Vanc 1g x 1 at 0030 (doses based on levels, CrCl low d/t low TBW) 11/3 0800 Vanc level: <5, 1g dose cleared completely at 32 hours, will start q24h dosing  Goal of Therapy:  Vancomycin trough level 15-20 mcg/ml  Doses adjusted per renal function Eradication of infection  Plan:  1.  Vancomycin 750 mg IV q24h. 2.  Continue Aztreonam 1g IV q8h. 3.  F/u culture results, trough level, SCr.  Waller Marcussen 03/19/2014,9:06 AM

## 2014-03-20 LAB — CBC
HCT: 36.1 % (ref 36.0–46.0)
Hemoglobin: 11.3 g/dL — ABNORMAL LOW (ref 12.0–15.0)
MCH: 28.8 pg (ref 26.0–34.0)
MCHC: 31.3 g/dL (ref 30.0–36.0)
MCV: 92.1 fL (ref 78.0–100.0)
Platelets: 315 10*3/uL (ref 150–400)
RBC: 3.92 MIL/uL (ref 3.87–5.11)
RDW: 17.5 % — ABNORMAL HIGH (ref 11.5–15.5)
WBC: 3.3 10*3/uL — AB (ref 4.0–10.5)

## 2014-03-20 LAB — BASIC METABOLIC PANEL
Anion gap: 9 (ref 5–15)
BUN: 27 mg/dL — ABNORMAL HIGH (ref 6–23)
CO2: 29 mEq/L (ref 19–32)
Calcium: 10.4 mg/dL (ref 8.4–10.5)
Chloride: 103 mEq/L (ref 96–112)
Creatinine, Ser: 0.55 mg/dL (ref 0.50–1.10)
GFR calc non Af Amer: 81 mL/min — ABNORMAL LOW (ref >90)
GLUCOSE: 167 mg/dL — AB (ref 70–99)
POTASSIUM: 4.9 meq/L (ref 3.7–5.3)
SODIUM: 141 meq/L (ref 137–147)

## 2014-03-20 LAB — CULTURE, BLOOD (ROUTINE X 2)

## 2014-03-20 NOTE — Plan of Care (Signed)
Problem: Phase I Progression Outcomes Goal: Dyspnea controlled at rest Outcome: Completed/Met Date Met:  03/20/14 Goal: Other Phase II Outcomes/Goals Outcome: Completed/Met Date Met:  03/20/14

## 2014-03-20 NOTE — Progress Notes (Signed)
Physical Therapy Treatment Patient Details Name: [name] MRN: [id number] DOB: 11/24/1922 Today's Date: 03/20/2014    History of Present Illness 80 yo female admitted with Pna, weakness. Hx of NHL, splenectomy, COPD, dementia, osteoporosis. Pt is from ALF    PT Comments    Pt up in chair leaning to L with sleepy appearance.  Have seen her transition to chair and back but not able to really walk due to her lethargy.  Has plan to go back to memory unit tomorrow and do not know if she must independent to go back.    Follow Up Recommendations  Home health PT;Supervision/Assistance - 24 hour     Equipment Recommendations  Other (comment) (PT has no information about her PLOF regarding equipment)    Recommendations for Other Services OT consult     Precautions / Restrictions Precautions Precautions: Fall Precaution Comments: chair alarm Restrictions Weight Bearing Restrictions: No    Mobility  Bed Mobility                  Transfers Overall transfer level: Needs assistance Equipment used: 1 person hand held assist Transfers: Sit to/from Stand;Stand Pivot Transfers Sit to Stand: Min assist Stand pivot transfers: Min assist       General transfer comment: cues for hand placement and to instruct her for safety  Ambulation/Gait                 Stairs            Wheelchair Mobility    Modified Rankin (Stroke Patients Only)       Balance Overall balance assessment: Needs assistance Sitting-balance support: Feet supported Sitting balance-Leahy Scale: Fair Sitting balance - Comments: listing to L in chair and supported with pillow Postural control: Posterior lean Standing balance support: Single extremity supported Standing balance-Leahy Scale: Poor Standing balance comment: cues for directing steps to pivot and sit                    Cognition Arousal/Alertness: Lethargic Behavior During Therapy: Flat affect Overall  Cognitive Status: History of cognitive impairments - at baseline       Memory: Decreased recall of precautions;Decreased short-term memory              Exercises General Exercises - Lower Extremity Ankle Circles/Pumps: AAROM;Both;10 reps Short Arc QuadSinclair Ship;Both;10 reps Long Arc Quad: AAROM;Both;10 reps Heel Slides: AAROM;Both;10 reps Hip ABduction/ADduction: AAROM;Both;10 reps Straight Leg Raises: AAROM;Both;10 reps Hip Flexion/Marching: AAROM;Both;10 reps    General Comments General comments (skin integrity, edema, etc.): Sleeping in chair and per nsg plan is still to go to ALF tomorrow.  Have not seen this pt and may or may not be ready to do this tomorrow.      Pertinent Vitals/Pain Pain Assessment: No/denies pain    Home Living                      Prior Function            PT Goals (current goals can now be found in the care plan section) Acute Rehab PT Goals Patient Stated Goal: none Progress towards PT goals:  (slow progression )    Frequency  Min 3X/week    PT Plan      Co-evaluation             End of Session   Activity Tolerance: Patient limited by lethargy Patient left: in chair;with call bell/phone within reach;with chair  alarm set     Time: 1205-1228 PT Time Calculation (min): 23 min  Charges:  $Therapeutic Exercise: 8-22 mins $Therapeutic Activity: 8-22 mins                    G Codes:      Ramond Dial 04/18/14, 12:50 PM  Mee Hives, PT MS Acute Rehab Dept. Number: [phone]

## 2014-03-20 NOTE — Progress Notes (Signed)
Subjective: Had a rough afternoon yesterday. Seems to be doing a bit better Denies any pain   Objective: Vital signs in last 24 hours: Temp:  [97.5 F (36.4 C)-98 F (36.7 C)] 97.5 F (36.4 C) (11/04 0511) Pulse Rate:  [94-103] 98 (11/04 0511) Resp:  [18-24] 18 (11/04 0511) BP: (113-185)/(71-90) 113/71 mmHg (11/04 0511) SpO2:  [81 %-98 %] 98 % (11/04 0739) Weight:  [42.4 kg (93 lb 7.6 oz)] 42.4 kg (93 lb 7.6 oz) (11/04 0511)  Intake/Output from previous day: 11/03 0701 - 11/04 0700 In: 2165 [P.O.:10; I.V.:1855; IV Piggyback:300] Out: 100 [Urine:100] Intake/Output this shift:    Frail non toxic, neb tx in progress. Some right sided adventitious sounds, ht IR IR abd soft NT, will awaken and talk a little  Lab Results   Recent Labs  03/19/14 0448 03/20/14 0515  WBC 10.5 3.3*  RBC 3.45* 3.92  HGB 10.0* 11.3*  HCT 31.9* 36.1  MCV 92.5 92.1  MCH 29.0 28.8  RDW 17.8* 17.5*  PLT 268 315    Recent Labs  03/19/14 0448 03/20/14 0515  NA 139 141  K 4.6 4.9  CL 103 103  CO2 30 29  GLUCOSE 86 167*  BUN 34* 27*  CREATININE 0.67 0.55  CALCIUM 10.0 10.4    Studies/Results: Dg Chest 2 View  03/19/2014   CLINICAL DATA:  Productive cough and weakness  EXAM: CHEST  2 VIEW  COMPARISON:  03/17/2014  FINDINGS: Cardiac shadow is mildly enlarged. Increased density is again noted in the left apex similar to that seen on the prior exam. Some increased infiltrate is noted in the right upper lobe medially which was less well visualized on the prior study. Left pleural effusion is again noted. Diffuse interstitial changes are noted.  IMPRESSION: Persistent left upper end new right upper lobe infiltrates. Continued followup is recommended.   Electronically Signed   By: Inez Catalina M.D.   On: 03/19/2014 10:35    Scheduled Meds: . aspirin EC  81 mg Oral Daily  . aztreonam  1 g Intravenous 3 times per day  . cholecalciferol  1,000 Units Oral Daily  . docusate sodium  100 mg Oral Daily   . donepezil  10 mg Oral QHS  . enoxaparin (LOVENOX) injection  30 mg Subcutaneous Q24H  . escitalopram  10 mg Oral Daily  . feeding supplement (ENSURE COMPLETE)  237 mL Oral TID BM  . fenofibrate  80 mg Oral Daily  . ferrous sulfate  325 mg Oral BID  . ipratropium-albuterol  3 mL Nebulization Q4H  . methylPREDNISolone (SOLU-MEDROL) injection  60 mg Intravenous Q6H  . metoprolol succinate  25 mg Oral Daily  . multivitamin-prenatal  1 tablet Oral Daily  . potassium chloride SA  20 mEq Oral Daily  . pregabalin  100 mg Oral QHS  . risperiDONE  0.5 mg Oral QHS  . sodium chloride  3 mL Intravenous Q12H  . vancomycin  750 mg Intravenous Q24H   Continuous Infusions: . sodium chloride 75 mL/hr at 03/19/14 0510   PRN Meds:acetaminophen **OR** acetaminophen, alum & mag hydroxide-simeth, HYDROcodone-homatropine, ipratropium, LORazepam, ondansetron **OR** ondansetron (ZOFRAN) IV, oxyCODONE-acetaminophen, polyethylene glycol  Assessment/Plan: BILATERAL  UPPER LOBE HCAP:/GPC bacteremia: XR now shows bilat issues. On steroids and nebs now ANEMIA: better at 11.3 DEMENTIA, ADVANCED: about the same as when I last saw her many months ago AFIB: stable rate, off anticoag due to GI bleeding, will stop telemetry NON HODGKINS LYMPHOMA: seems OK ADULT FTT/MALNUTRITION: looks progressive HYPERLIPIDEMIA:  on Rx DEPRESSION: seems OK  GOITER: TSH fine CODE STATUS: DNR Updated dtr in detail yesterday   LOS: 3 days   [name] [name] 03/20/2014, 7:49 AM

## 2014-03-20 NOTE — Progress Notes (Signed)
RN called daughter Ms Glynda Jaeger @ [phone] (return call). Daughter was glad that patient was calm and quiet at the moment. Will continue to monitor the patient.

## 2014-03-20 NOTE — Plan of Care (Signed)
Problem: Phase I Progression Outcomes Goal: Dyspnea controlled at rest Outcome: Progressing     

## 2014-03-21 LAB — BASIC METABOLIC PANEL
ANION GAP: 6 (ref 5–15)
BUN: 31 mg/dL — ABNORMAL HIGH (ref 6–23)
CHLORIDE: 100 meq/L (ref 96–112)
CO2: 31 meq/L (ref 19–32)
Calcium: 10.5 mg/dL (ref 8.4–10.5)
Creatinine, Ser: 0.55 mg/dL (ref 0.50–1.10)
GFR calc Af Amer: >90 mL/min (ref >90)
GFR calc non Af Amer: 81 mL/min — ABNORMAL LOW (ref >90)
Glucose, Bld: 153 mg/dL — ABNORMAL HIGH (ref 70–99)
Potassium: 4.6 mEq/L (ref 3.7–5.3)
SODIUM: 137 meq/L (ref 137–147)

## 2014-03-21 LAB — CBC
HEMATOCRIT: 32 % — AB (ref 36.0–46.0)
HEMOGLOBIN: 10.3 g/dL — AB (ref 12.0–15.0)
MCH: 28.6 pg (ref 26.0–34.0)
MCHC: 32.2 g/dL (ref 30.0–36.0)
MCV: 88.9 fL (ref 78.0–100.0)
Platelets: 354 10*3/uL (ref 150–400)
RBC: 3.6 MIL/uL — AB (ref 3.87–5.11)
RDW: 17.5 % — AB (ref 11.5–15.5)
WBC: 8 10*3/uL (ref 4.0–10.5)

## 2014-03-21 MED ORDER — IPRATROPIUM-ALBUTEROL 0.5-2.5 (3) MG/3ML IN SOLN
3.0000 mL | Freq: Three times a day (TID) | RESPIRATORY_TRACT | Status: DC
  Administered 2014-03-21 – 2014-03-22 (×6): 3 mL via RESPIRATORY_TRACT
  Filled 2014-03-21 (×6): qty 3

## 2014-03-21 MED ORDER — LEVOFLOXACIN IN D5W 750 MG/150ML IV SOLN
750.0000 mg | INTRAVENOUS | Status: DC
  Administered 2014-03-21 – 2014-03-23 (×2): 750 mg via INTRAVENOUS
  Filled 2014-03-21 (×3): qty 150

## 2014-03-21 NOTE — Plan of Care (Signed)
Problem: Phase II Progression Outcomes Goal: Pain controlled on oral analgesia Outcome: Completed/Met Date Met:  03/21/14

## 2014-03-21 NOTE — Progress Notes (Signed)
Subjective: No pain or SOB this AM   Objective: Vital signs in last 24 hours: Temp:  [97.3 F (36.3 C)-97.7 F (36.5 C)] 97.3 F (36.3 C) (11/05 0500) Pulse Rate:  [62-95] 62 (11/05 0500) Resp:  [18-20] 18 (11/05 0500) BP: (150-156)/(72-95) 152/72 mmHg (11/05 0500) SpO2:  [96 %-100 %] 96 % (11/05 0730) Weight:  [42.9 kg (94 lb 9.2 oz)] 42.9 kg (94 lb 9.2 oz) (11/05 0500)  Intake/Output from previous day: 11/04 0701 - 11/05 0700 In: 2108.8 [P.O.:120; I.V.:1688.8; IV Piggyback:300] Out: 300 [Urine:300] Intake/Output this shift:    Less toxic, sitting up O2 in place. Better air movement. Ht regular abd soft NT awake, confused  Lab Results   Recent Labs  03/20/14 0515 03/21/14 0441  WBC 3.3* 8.0  RBC 3.92 3.60*  HGB 11.3* 10.3*  HCT 36.1 32.0*  MCV 92.1 88.9  MCH 28.8 28.6  RDW 17.5* 17.5*  PLT 315 354    Recent Labs  03/20/14 0515 03/21/14 0441  NA 141 137  K 4.9 4.6  CL 103 100  CO2 29 31  GLUCOSE 167* 153*  BUN 27* 31*  CREATININE 0.55 0.55  CALCIUM 10.4 10.5   Culture Setup Time 03/18/2014 02:25  Performed at [facility]  Note: Gram Stain Report Called to,Read Back By and Verified With: JESSICA DEUTSCH 03/18/14 1055 BY SMITHERSJ  Performed at [facility]       Report Status 03/20/2014 FINAL   Organism ID, Bacteria STREPTOCOCCUS PNEUMONIAE   Resulting Agency [facility]    Culture & Susceptibility      STREPTOCOCCUS PNEUMONIAE     Antibiotic Sensitivity Microscan Status    CEFTRIAXONE Sensitive 0.016 SENSITIVE Final    Method: MIC (ETEST)    LEVOFLOXACIN Sensitive 1.0 SENSITIVE Final    Method: MIC (ETEST)    PENICILLIN Sensitive 0.023 SENSITIVE Final        Studies/Results: Dg Chest 2 View  03/19/2014   CLINICAL DATA:  Productive cough and weakness  EXAM: CHEST  2 VIEW  COMPARISON:  03/17/2014  FINDINGS: Cardiac shadow is mildly enlarged. Increased density is again  noted in the left apex similar to that seen on the prior exam. Some increased infiltrate is noted in the right upper lobe medially which was less well visualized on the prior study. Left pleural effusion is again noted. Diffuse interstitial changes are noted.  IMPRESSION: Persistent left upper end new right upper lobe infiltrates. Continued followup is recommended.   Electronically Signed   By: Inez Catalina M.D.   On: 03/19/2014 10:35    Scheduled Meds: . aspirin EC  81 mg Oral Daily  . aztreonam  1 g Intravenous 3 times per day  . cholecalciferol  1,000 Units Oral Daily  . docusate sodium  100 mg Oral Daily  . donepezil  10 mg Oral QHS  . enoxaparin (LOVENOX) injection  30 mg Subcutaneous Q24H  . escitalopram  10 mg Oral Daily  . feeding supplement (ENSURE COMPLETE)  237 mL Oral TID BM  . fenofibrate  80 mg Oral Daily  . ferrous sulfate  325 mg Oral BID  . ipratropium-albuterol  3 mL Nebulization TID  . methylPREDNISolone (SOLU-MEDROL) injection  60 mg Intravenous Q6H  . metoprolol succinate  25 mg Oral Daily  . multivitamin-prenatal  1 tablet Oral Daily  . potassium chloride SA  20 mEq Oral Daily  . pregabalin  100 mg Oral QHS  . risperiDONE  0.5 mg Oral QHS  . sodium chloride  3 mL Intravenous Q12H  . vancomycin  750 mg Intravenous Q24H   Continuous Infusions: . sodium chloride 75 mL/hr at 03/21/14 0326   PRN Meds:acetaminophen **OR** acetaminophen, alum & mag hydroxide-simeth, HYDROcodone-homatropine, ipratropium, LORazepam, ondansetron **OR** ondansetron (ZOFRAN) IV, oxyCODONE-acetaminophen, polyethylene glycol  Assessment/Plan:  PNEUMOCOCCAL BILATERAL UPPER LOBE HCAP: Will change to levaquin alone  ANEMIA: better at 11.3 DEMENTIA, ADVANCED: about the same as when I last saw her many months ago AFIB: stable rate, off anticoag due to GI bleeding, will stop telemetry NON HODGKINS LYMPHOMA: seems OK ADULT FTT/MALNUTRITION: looks progressive HYPERLIPIDEMIA: on Rx DEPRESSION:  seems OK  GOITER: TSH fine CODE STATUS: DNR      LOS: 4 days   [name] [name] 03/21/2014, 8:31 AM

## 2014-03-21 NOTE — Plan of Care (Signed)
Problem: Phase II Progression Outcomes Goal: Dyspnea controlled w/progressive activity Outcome: Completed/Met Date Met:  03/21/14

## 2014-03-21 NOTE — Progress Notes (Signed)
SATURATION QUALIFICATIONS: (This note is used to comply with regulatory documentation for home oxygen)  Patient Saturations on Room Air at Rest = 87%  Patient Saturations on Room Air while Ambulating = 85%  Patient Saturations on 2 Liters of oxygen while Ambulating = 94%  Please briefly explain why patient needs home oxygen:

## 2014-03-21 NOTE — Progress Notes (Signed)
ANTIBIOTIC CONSULT NOTE - FOLLOW UP  Pharmacy Consult for Vancomycin, Aztreonam Indication: pneumonia and S.pneumoniae bacteremia  Allergies  Allergen Reactions  . Iodine     Fever , chills  . Penicillins     Unknown   . Shellfish-Derived Products     Unknown     Patient Measurements: Height: 5\' 7"  (170.2 cm) Weight: 94 lb 9.2 oz (42.9 kg) IBW/kg (Calculated) : 61.6  Vital Signs: Temp: 97.3 F (36.3 C) (11/05 0500) Temp Source: Axillary (11/05 0500) BP: 152/72 mmHg (11/05 0500) Pulse Rate: 62 (11/05 0500) Intake/Output from previous day: 11/04 0701 - 11/05 0700 In: 2108.8 [P.O.:120; I.V.:1688.8; IV Piggyback:300] Out: 300 [Urine:300] Intake/Output from this shift:    Labs:  Recent Labs  03/19/14 0448 03/20/14 0515 03/21/14 0441  WBC 10.5 3.3* 8.0  HGB 10.0* 11.3* 10.3*  PLT 268 315 354  CREATININE 0.67 0.55 0.55   Estimated Creatinine Clearance: 32.3 mL/min (by C-G formula based on Cr of 0.55).  Recent Labs  03/19/14 0756  VANCOTROUGH <5.0*      Assessment: 78 yoF with non-Hodgkin's lymphoma status post splenectomy, COPD, and dementia who presented to the emergency department with a complaint of cough and weakness. Aztreonam and Vanc per pharmacy for HCAP.  Blood cultures grew S.pneumoniae bacteremia.  11/2 >> aztreonam >> 11/2 >> vanc >>  Tmax: afebrile WBC: WNL Renal: SCr 0.55, CrCl~30 ml/min (CG, using adjusted SCr 0.8)  Mirco: 11/1: blood x 2: 2/2 strept pneumoniae (pan-sensitive)  Levels/dose adjustments: 11/2 Vanc 1g x 1 at 0030 (doses based on levels, CrCl low d/t low TBW) 11/3 0800 Vanc level: <5, 1g dose cleared completely at 32 hours, start q24h dosing  Today is day #4 Vanc 750 mg IV q24h and aztreonam 1g IV q8h for HCAP and pneumococcal bacteremia.  Patient is s/p splenectomy so patient is more susceptible to pneumococcal infection.  CXR 11/3 showed persistent left upper end new right upper lobe infiltrates.   Goal of Therapy:   Vancomycin trough level 15-20 mcg/ml  Doses adjusted per renal function Eradication of infection  Plan:  1.  Will check a vancomycin trough level this afternoon. 2.  Narrow to levaquin to treat hcap and bacteremia? Recommend tx duration of 10-14 days for bacteremia.  Hershal Coria 03/21/2014,8:04 AM

## 2014-03-22 LAB — BASIC METABOLIC PANEL
Anion gap: 5 (ref 5–15)
BUN: 28 mg/dL — AB (ref 6–23)
CHLORIDE: 93 meq/L — AB (ref 96–112)
CO2: 32 mEq/L (ref 19–32)
Calcium: 10.2 mg/dL (ref 8.4–10.5)
Creatinine, Ser: 0.52 mg/dL (ref 0.50–1.10)
GFR calc non Af Amer: 82 mL/min — ABNORMAL LOW (ref >90)
GLUCOSE: 119 mg/dL — AB (ref 70–99)
POTASSIUM: 4.4 meq/L (ref 3.7–5.3)
Sodium: 130 mEq/L — ABNORMAL LOW (ref 137–147)

## 2014-03-22 LAB — CBC
HCT: 30.2 % — ABNORMAL LOW (ref 36.0–46.0)
HEMOGLOBIN: 9.9 g/dL — AB (ref 12.0–15.0)
MCH: 28.9 pg (ref 26.0–34.0)
MCHC: 32.8 g/dL (ref 30.0–36.0)
MCV: 88.3 fL (ref 78.0–100.0)
Platelets: 344 10*3/uL (ref 150–400)
RBC: 3.42 MIL/uL — ABNORMAL LOW (ref 3.87–5.11)
RDW: 17.3 % — ABNORMAL HIGH (ref 11.5–15.5)
WBC: 7.7 10*3/uL (ref 4.0–10.5)

## 2014-03-22 NOTE — Progress Notes (Signed)
Patient is  Hypertensive., B/P 172/88 PCP on call was notified @ [phone] Awaiting a return call.

## 2014-03-22 NOTE — Progress Notes (Signed)
Subjective: More alert today. More interactive. No pain   Objective: Vital signs in last 24 hours: Temp:  [97.4 F (36.3 C)-98.4 F (36.9 C)] 98.4 F (36.9 C) (11/06 0457) Pulse Rate:  [64-83] 78 (11/06 0457) Resp:  [18-20] 18 (11/06 0457) BP: (158-183)/(80-103) 158/82 mmHg (11/06 0636) SpO2:  [91 %-98 %] 98 % (11/06 0457)  Intake/Output from previous day: 11/05 0701 - 11/06 0700 In: 1215 [P.O.:540; I.V.:525; IV Piggyback:150] Out: 250 [Urine:250] Intake/Output this shift:    Sitting up in no distress. Oral membranes moist, lungs are a bit clearer ht IR IR abd soft NT, thin extrems no edema. Will talk a little, confused  Lab Results   Recent Labs  03/21/14 0441 03/22/14 0429  WBC 8.0 7.7  RBC 3.60* 3.42*  HGB 10.3* 9.9*  HCT 32.0* 30.2*  MCV 88.9 88.3  MCH 28.6 28.9  RDW 17.5* 17.3*  PLT 354 344    Recent Labs  03/21/14 0441 03/22/14 0429  NA 137 130*  K 4.6 4.4  CL 100 93*  CO2 31 32  GLUCOSE 153* 119*  BUN 31* 28*  CREATININE 0.55 0.52  CALCIUM 10.5 10.2    Studies/Results: No results found.  Scheduled Meds: . aspirin EC  81 mg Oral Daily  . cholecalciferol  1,000 Units Oral Daily  . docusate sodium  100 mg Oral Daily  . donepezil  10 mg Oral QHS  . enoxaparin (LOVENOX) injection  30 mg Subcutaneous Q24H  . escitalopram  10 mg Oral Daily  . feeding supplement (ENSURE COMPLETE)  237 mL Oral TID BM  . fenofibrate  80 mg Oral Daily  . ferrous sulfate  325 mg Oral BID  . ipratropium-albuterol  3 mL Nebulization TID  . levofloxacin (LEVAQUIN) IV  750 mg Intravenous Q48H  . methylPREDNISolone (SOLU-MEDROL) injection  60 mg Intravenous Q6H  . metoprolol succinate  25 mg Oral Daily  . multivitamin-prenatal  1 tablet Oral Daily  . potassium chloride SA  20 mEq Oral Daily  . pregabalin  100 mg Oral QHS  . risperiDONE  0.5 mg Oral QHS  . sodium chloride  3 mL Intravenous Q12H   Continuous Infusions: . sodium chloride 75 mL/hr at 03/22/14 0649    PRN Meds:acetaminophen **OR** acetaminophen, alum & mag hydroxide-simeth, HYDROcodone-homatropine, ipratropium, LORazepam, ondansetron **OR** ondansetron (ZOFRAN) IV, oxyCODONE-acetaminophen, polyethylene glycol  Assessment/Plan:  PNEUMOCOCCAL BILATERAL UPPER LOBE HCAP: on QOD levaquin, getting better ANEMIA: better at 11.3 DEMENTIA, ADVANCED: about the same as when I last saw her many months ago, sl better today AFIB: stable rate, off anticoag due to GI bleeding, will stop telemetry NON HODGKINS LYMPHOMA: seems OK ADULT FTT/MALNUTRITION: looks progressive HYPERLIPIDEMIA: on Rx DEPRESSION: seems OK  GOITER: TSH fine CODE STATUS: DNR DISP: may be weak enough to need SNF stay     LOS: 5 days   [name] [name] 03/22/2014, 9:11 AM

## 2014-03-22 NOTE — Plan of Care (Signed)
Problem: Phase II Progression Outcomes Goal: Activity at appropriate level-compared to baseline (UP IN CHAIR FOR HEMODIALYSIS)  Outcome: Progressing     

## 2014-03-22 NOTE — Plan of Care (Signed)
Problem: Phase II Progression Outcomes Goal: Activity at appropriate level-compared to baseline (UP IN CHAIR FOR HEMODIALYSIS)  Outcome: Completed/Met Date Met:  03/22/14 Goal: Tolerating diet Outcome: Completed/Met Date Met:  03/22/14 Goal: Discharge plan remains appropriate-arrangements made Outcome: Completed/Met Date Met:  03/22/14 Goal: Other Phase II Outcomes/Goals Outcome: Completed/Met Date Met:  03/22/14

## 2014-03-22 NOTE — Progress Notes (Addendum)
Physical Therapy Treatment Patient Details Name: [name] MRN: [id number] DOB: 1922-09-21 Today's Date: 03/22/2014    History of Present Illness 80 yo female admitted with Pna, weakness. Hx of NHL, splenectomy, COPD, dementia, osteoporosis. Pt is from ALF    PT Comments    Pt OOB on [facility] with nursing students on 2 lts O2.  Assisted with amb in hallway twice with RW.  Unsteady gait and required MAX direction due to memory impairment and HOH.    Follow Up Recommendations  ALF   (pt requires assist with all care and mobility)     Equipment Recommendations       Recommendations for Other Services       Precautions / Restrictions Precautions Precautions: Fall Precaution Comments: chair alarm Restrictions Weight Bearing Restrictions: No    Mobility  Bed Mobility               General bed mobility comments: Pt OOB on BSC with nursing students  Transfers Overall transfer level: Needs assistance Equipment used: Rolling walker (2 wheeled) Transfers: Sit to/from Stand Sit to Stand: Min assist         General transfer comment: 50% VC's on proper hand placement and safety with turn completion prior to sit.  Ambulation/Gait Ambulation/Gait assistance: Min assist Ambulation Distance (Feet): 50 Feet (25 feet x 2 one sitting rest break) Assistive device: Rolling walker (2 wheeled)   Gait velocity: decreased   General Gait Details: amb with RW required direction on proper use.  First amb with 2 lts sats avg 92%.  Second amb on RA sats avg 88% with increased HR to 120"s.  Limited activity tolerance.  Fatigues quickly.     Stairs            Wheelchair Mobility    Modified Rankin (Stroke Patients Only)       Balance                                    Cognition                            Exercises      General Comments        Pertinent Vitals/Pain Pain Assessment: Faces Faces Pain Scale: Hurts little more Pain  Location: L shoulder when sitting after amb Pain Intervention(s): Monitored during session    Home Living                      Prior Function            PT Goals (current goals can now be found in the care plan section) Progress towards PT goals: Progressing toward goals    Frequency  Min 3X/week    PT Plan      Co-evaluation             End of Session Equipment Utilized During Treatment: Gait belt;Oxygen Activity Tolerance: Treatment limited secondary to medical complications (Comment) Patient left: in chair;with call bell/phone within reach;with chair alarm set     Time: 4967-5916 PT Time Calculation (min): 25 min  Charges:  $Gait Training: 8-22 mins $Therapeutic Activity: 8-22 mins                    G Codes:      Rica Koyanagi  PTA [facility]  Acute  Rehab  Pager      [phone]

## 2014-03-22 NOTE — Progress Notes (Signed)
CSW spoke with patient's daughter, [name] re: discharge planning. Daughter states that she would really prefer that she return to ALF as she was recently moved into the Memory Care Unit and feels that if she went back to a familiar environment. CSW spoke with [facility] @ ALF who states that they should be able to accept patient back on Monday, pending that she is changed from IV to PO medications. CSW will follow-up Monday.   Clinical Social Work Department CLINICAL SOCIAL WORK PLACEMENT NOTE 03/22/2014  Patient:  [name], [name]  Account Number:  [id number] Admit date:  03/17/2014  Clinical Social Worker:  Renold Genta  Date/time:  03/22/2014 04:14 PM  Clinical Social Work is seeking post-discharge placement for this patient at the following level of care:   SKILLED NURSING   (*CSW will update this form in Epic as items are completed)   03/22/2014  Patient/family provided with [place] Department of Clinical Social Work's list of facilities offering this level of care within the geographic area requested by the patient (or if unable, by the patient's family).  03/22/2014  Patient/family informed of their freedom to choose among providers that offer the needed level of care, that participate in Medicare, Medicaid or managed care program needed by the patient, have an available bed and are willing to accept the patient.  03/22/2014  Patient/family informed of [facility]' ownership interest in [facility], as well as of the fact that they are under no obligation to receive care at this facility.  PASARR submitted to EDS on 03/22/2014 PASARR number received on 03/22/2014  FL2 transmitted to all facilities in geographic area requested by pt/family on  03/22/2014 FL2 transmitted to all facilities within larger geographic area on   Patient informed that his/her managed care company has contracts with or will negotiate with  certain facilities, including the following:   [facility]     Patient/family informed of bed offers received:   Patient chooses bed at  Physician recommends and patient chooses bed at    Patient to be transferred to  on   Patient to be transferred to facility by  Patient and family notified of transfer on  Name of family member notified:    The following physician request were entered in Epic:   Additional Comments:   Raynaldo Opitz, [place] Social Worker cell #: [phone]

## 2014-03-22 NOTE — Progress Notes (Signed)
RN was notified by Dr. Dagmar Hait to give the Toprol XL 25 mg early.  Toprol XL 25 mg was given to patient.  RN will recheck B/P later.

## 2014-03-23 ENCOUNTER — Inpatient Hospital Stay (HOSPITAL_COMMUNITY): Payer: Medicare Other

## 2014-03-23 LAB — COMPREHENSIVE METABOLIC PANEL
ALT: 20 U/L (ref 0–35)
AST: 26 U/L (ref 0–37)
Albumin: 2.1 g/dL — ABNORMAL LOW (ref 3.5–5.2)
Alkaline Phosphatase: 69 U/L (ref 39–117)
Anion gap: 9 (ref 5–15)
BILIRUBIN TOTAL: 0.4 mg/dL (ref 0.3–1.2)
BUN: 28 mg/dL — ABNORMAL HIGH (ref 6–23)
CALCIUM: 10.3 mg/dL (ref 8.4–10.5)
CO2: 32 mEq/L (ref 19–32)
Chloride: 98 mEq/L (ref 96–112)
Creatinine, Ser: 0.46 mg/dL — ABNORMAL LOW (ref 0.50–1.10)
GFR, EST NON AFRICAN AMERICAN: 86 mL/min — AB (ref >90)
Glucose, Bld: 103 mg/dL — ABNORMAL HIGH (ref 70–99)
Potassium: 4.5 mEq/L (ref 3.7–5.3)
Sodium: 139 mEq/L (ref 137–147)
Total Protein: 6.6 g/dL (ref 6.0–8.3)

## 2014-03-23 LAB — CBC
HCT: 37.1 % (ref 36.0–46.0)
Hemoglobin: 12.3 g/dL (ref 12.0–15.0)
MCH: 28.9 pg (ref 26.0–34.0)
MCHC: 33.2 g/dL (ref 30.0–36.0)
MCV: 87.1 fL (ref 78.0–100.0)
PLATELETS: 419 10*3/uL — AB (ref 150–400)
RBC: 4.26 MIL/uL (ref 3.87–5.11)
RDW: 17.3 % — AB (ref 11.5–15.5)
WBC: 8.1 10*3/uL (ref 4.0–10.5)

## 2014-03-23 MED ORDER — HYDRALAZINE HCL 20 MG/ML IJ SOLN
10.0000 mg | Freq: Once | INTRAMUSCULAR | Status: AC
  Administered 2014-03-23: 10 mg via INTRAVENOUS
  Filled 2014-03-23: qty 1

## 2014-03-23 MED ORDER — METHYLPREDNISOLONE SODIUM SUCC 125 MG IJ SOLR
60.0000 mg | Freq: Two times a day (BID) | INTRAMUSCULAR | Status: DC
  Administered 2014-03-23 – 2014-03-25 (×4): 60 mg via INTRAVENOUS
  Filled 2014-03-23 (×4): qty 0.96

## 2014-03-23 NOTE — Progress Notes (Signed)
Subjective: No events overnight   Objective: Vital signs in last 24 hours: Temp:  [97.4 F (36.3 C)-98 F (36.7 C)] 97.4 F (36.3 C) (11/07 0607) Pulse Rate:  [64-76] 67 (11/07 0721) Resp:  [18-20] 18 (11/07 0607) BP: (155-193)/(66-107) 155/73 mmHg (11/07 0721) SpO2:  [93 %-100 %] 100 % (11/07 0607) Weight:  [99 lb 10.4 oz (45.2 kg)-100 lb 1.4 oz (45.4 kg)] 99 lb 10.4 oz (45.2 kg) (11/07 0721)  Intake/Output from previous day: 11/06 0701 - 11/07 0700 In: 3080 [P.O.:80; I.V.:3000] Out: 100 [Urine:100] Intake/Output this shift:    Sitting up in no distress. Oral membranes moist, lungs are a bit clearer ht IR IR abd soft NT, thin extrems no edema. Will talk a little, confused  Lab Results   Recent Labs  03/22/14 0429 03/23/14 0718  WBC 7.7 8.1  RBC 3.42* 4.26  HGB 9.9* 12.3  HCT 30.2* 37.1  MCV 88.3 87.1  MCH 28.9 28.9  RDW 17.3* 17.3*  PLT 344 419*    Recent Labs  03/22/14 0429 03/23/14 0718  NA 130* 139  K 4.4 4.5  CL 93* 98  CO2 32 32  GLUCOSE 119* 103*  BUN 28* 28*  CREATININE 0.52 0.46*  CALCIUM 10.2 10.3    Studies/Results: Dg Chest 2 View  03/23/2014   CLINICAL DATA:  Pneumonia.  EXAM: CHEST  2 VIEW  COMPARISON:  03/19/2014  FINDINGS: Increased opacification at the left base, mainly pleural fluid. Small right pleural effusion also present. Bilateral upper lobe opacity has decreased, but is still readily apparent on the left. Unchanged cardiomegaly. Pulmonary venous congestion. No pneumothorax.  IMPRESSION: 1. Small bilateral pleural effusions, increased on the left. 2. Improving apical aeration. Pneumonia is still visible in the left upper lobe.   Electronically Signed   By: Jorje Guild M.D.   On: 03/23/2014 08:26    Scheduled Meds: . aspirin EC  81 mg Oral Daily  . cholecalciferol  1,000 Units Oral Daily  . docusate sodium  100 mg Oral Daily  . donepezil  10 mg Oral QHS  . enoxaparin (LOVENOX) injection  30 mg Subcutaneous Q24H  . escitalopram   10 mg Oral Daily  . feeding supplement (ENSURE COMPLETE)  237 mL Oral TID BM  . fenofibrate  80 mg Oral Daily  . ferrous sulfate  325 mg Oral BID  . levofloxacin (LEVAQUIN) IV  750 mg Intravenous Q48H  . methylPREDNISolone (SOLU-MEDROL) injection  60 mg Intravenous Q12H  . metoprolol succinate  25 mg Oral Daily  . multivitamin-prenatal  1 tablet Oral Daily  . potassium chloride SA  20 mEq Oral Daily  . pregabalin  100 mg Oral QHS  . risperiDONE  0.5 mg Oral QHS  . sodium chloride  3 mL Intravenous Q12H   Continuous Infusions: . sodium chloride 75 mL/hr at 03/23/14 0741   PRN Meds:acetaminophen **OR** acetaminophen, alum & mag hydroxide-simeth, HYDROcodone-homatropine, ipratropium, LORazepam, ondansetron **OR** ondansetron (ZOFRAN) IV, oxyCODONE-acetaminophen, polyethylene glycol  Assessment/Plan:  PNEUMOCOCCAL BILATERAL UPPER LOBE HCAP: on QOD levaquin day 7/10. Deescalating stress dose steroids to q12hours from q6hours.  Continue to taper this over next 2 weeks .  ANEMIA: stable DEMENTIA, ADVANCED: about the same as when I last saw her many months ago, sl better today AFIB: stable rate, off anticoag due to GI bleeding  NON HODGKINS LYMPHOMA: seems OK ADULT FTT/MALNUTRITION: looks progressive HYPERLIPIDEMIA: on Rx DEPRESSION: seems OK  GOITER: TSH fine CODE STATUS: DNR DISP: SNF pending for Monday  LOS: 6 days   [name] 03/23/2014, 10:09 AM

## 2014-03-24 MED ORDER — HYDRALAZINE HCL 20 MG/ML IJ SOLN
10.0000 mg | Freq: Four times a day (QID) | INTRAMUSCULAR | Status: DC | PRN
  Administered 2014-03-24 – 2014-03-25 (×2): 10 mg via INTRAVENOUS
  Filled 2014-03-24 (×3): qty 1

## 2014-03-24 MED ORDER — AMLODIPINE BESYLATE 5 MG PO TABS
5.0000 mg | ORAL_TABLET | Freq: Every day | ORAL | Status: DC
  Administered 2014-03-24 – 2014-03-25 (×2): 5 mg via ORAL
  Filled 2014-03-24 (×2): qty 1

## 2014-03-24 NOTE — Progress Notes (Signed)
Subjective: No events overnight Other than high BP  Objective: Vital signs in last 24 hours: Temp:  [97.5 F (36.4 C)-97.6 F (36.4 C)] 97.6 F (36.4 C) (11/08 0722) Pulse Rate:  [53-96] 53 (11/08 0722) Resp:  [20] 20 (11/08 0722) BP: (160-189)/(92-110) 189/92 mmHg (11/08 0722) SpO2:  [91 %-93 %] 93 % (11/08 0722) Weight:  [100 lb 8.5 oz (45.6 kg)] 100 lb 8.5 oz (45.6 kg) (11/08 0500)  Intake/Output from previous day: 11/07 0701 - 11/08 0700 In: 1515 [P.O.:240; I.V.:1125; IV Piggyback:150] Out: 650 [Urine:650] Intake/Output this shift:    Sitting up in no distress. Oral membranes moist, lungs are a bit clearer ht IR IR abd soft NT, thin extrems no edema. Will talk a little, confused  Lab Results   Recent Labs  03/22/14 0429 03/23/14 0718  WBC 7.7 8.1  RBC 3.42* 4.26  HGB 9.9* 12.3  HCT 30.2* 37.1  MCV 88.3 87.1  MCH 28.9 28.9  RDW 17.3* 17.3*  PLT 344 419*    Recent Labs  03/22/14 0429 03/23/14 0718  NA 130* 139  K 4.4 4.5  CL 93* 98  CO2 32 32  GLUCOSE 119* 103*  BUN 28* 28*  CREATININE 0.52 0.46*  CALCIUM 10.2 10.3    Studies/Results: Dg Chest 2 View  03/23/2014   CLINICAL DATA:  Pneumonia.  EXAM: CHEST  2 VIEW  COMPARISON:  03/19/2014  FINDINGS: Increased opacification at the left base, mainly pleural fluid. Small right pleural effusion also present. Bilateral upper lobe opacity has decreased, but is still readily apparent on the left. Unchanged cardiomegaly. Pulmonary venous congestion. No pneumothorax.  IMPRESSION: 1. Small bilateral pleural effusions, increased on the left. 2. Improving apical aeration. Pneumonia is still visible in the left upper lobe.   Electronically Signed   By: Jorje Guild M.D.   On: 03/23/2014 08:26    Scheduled Meds: . aspirin EC  81 mg Oral Daily  . cholecalciferol  1,000 Units Oral Daily  . docusate sodium  100 mg Oral Daily  . donepezil  10 mg Oral QHS  . enoxaparin (LOVENOX) injection  30 mg Subcutaneous Q24H  .  escitalopram  10 mg Oral Daily  . feeding supplement (ENSURE COMPLETE)  237 mL Oral TID BM  . fenofibrate  80 mg Oral Daily  . ferrous sulfate  325 mg Oral BID  . levofloxacin (LEVAQUIN) IV  750 mg Intravenous Q48H  . methylPREDNISolone (SOLU-MEDROL) injection  60 mg Intravenous Q12H  . metoprolol succinate  25 mg Oral Daily  . multivitamin-prenatal  1 tablet Oral Daily  . potassium chloride SA  20 mEq Oral Daily  . pregabalin  100 mg Oral QHS  . risperiDONE  0.5 mg Oral QHS  . sodium chloride  3 mL Intravenous Q12H   Continuous Infusions: . sodium chloride 75 mL/hr at 03/23/14 2308   PRN Meds:acetaminophen **OR** acetaminophen, alum & mag hydroxide-simeth, hydrALAZINE, HYDROcodone-homatropine, ipratropium, LORazepam, ondansetron **OR** ondansetron (ZOFRAN) IV, oxyCODONE-acetaminophen, polyethylene glycol  Assessment/Plan:  PNEUMOCOCCAL BILATERAL UPPER LOBE HCAP: on QOD levaquin day 8/10. Deescalating stress dose steroids to q12hours from q6hours.  Continue to taper this over next 2 weeks .  HTN: uncontrolled. Adding norvasc 5mg  daily . Hydralazine prn IV  ANEMIA: stable DEMENTIA, ADVANCED: about the same as when I last saw her many months ago, sl better today AFIB: stable rate, off anticoag due to GI bleeding  NON HODGKINS LYMPHOMA: seems OK ADULT FTT/MALNUTRITION: looks progressive HYPERLIPIDEMIA: on Rx DEPRESSION: seems OK  GOITER: TSH  fine CODE STATUS: DNR DISP: SNF pending for Monday      LOS: 7 days   Kenzley Ke 03/24/2014, 9:05 AM

## 2014-03-25 MED ORDER — LEVOFLOXACIN 500 MG PO TABS: 500.0000 mg | ORAL_TABLET | Freq: Every day | ORAL | Status: AC

## 2014-03-25 MED ORDER — LEVOFLOXACIN 500 MG PO TABS
500.0000 mg | ORAL_TABLET | Freq: Every day | ORAL | Status: DC
  Administered 2014-03-25: 500 mg via ORAL
  Filled 2014-03-25: qty 1

## 2014-03-25 MED ORDER — AMLODIPINE BESYLATE 5 MG PO TABS: 5.0000 mg | ORAL_TABLET | Freq: Every day | ORAL | Status: AC

## 2014-03-25 NOTE — Progress Notes (Signed)
Patient is set to discharge back to [facility] today. Patient's daughter, [name] aware. Discharge packet given to RN, Estill Bamberg. Daughter to transport back to ALF.   Clinical Social Work Department CLINICAL SOCIAL WORK PLACEMENT NOTE 03/25/2014  Patient:  [name], [name]  Account Number:  [id number] Admit date:  03/17/2014  Clinical Social Worker:  Renold Genta  Date/time:  03/22/2014 04:14 PM  Clinical Social Work is seeking post-discharge placement for this patient at the following level of care:   SKILLED NURSING   (*CSW will update this form in Epic as items are completed)   03/22/2014  Patient/family provided with [place] Department of Clinical Social Work's list of facilities offering this level of care within the geographic area requested by the patient (or if unable, by the patient's family).  03/22/2014  Patient/family informed of their freedom to choose among providers that offer the needed level of care, that participate in Medicare, Medicaid or managed care program needed by the patient, have an available bed and are willing to accept the patient.  03/22/2014  Patient/family informed of [facility]' ownership interest in [facility], as well as of the fact that they are under no obligation to receive care at this facility.  PASARR submitted to EDS on 03/22/2014 PASARR number received on 03/22/2014  FL2 transmitted to all facilities in geographic area requested by pt/family on  03/22/2014 FL2 transmitted to all facilities within larger geographic area on   Patient informed that his/her managed care company has contracts with or will negotiate with  certain facilities, including the following:   [facility]     Patient/family informed of bed offers received:   Patient chooses bed at  Physician recommends and patient chooses bed at    Patient to be transferred to [facility] on  03/25/2014 Patient to be transferred to facility by patient's  daughter, [name] Patient and family notified of transfer on 03/25/2014 Name of family member notified:  patient's daughter, [name] via phone  The following physician request were entered in Epic:   Additional Comments:   Raynaldo Opitz, [place] Social Worker cell #: [phone]

## 2014-03-25 NOTE — Plan of Care (Signed)
Problem: Discharge Progression Outcomes Goal: Discharge plan in place and appropriate Outcome: Completed/Met Date Met:  03/25/14 Goal: Pain controlled with appropriate interventions Outcome: Completed/Met Date Met:  03/25/14 Goal: Tolerating diet Outcome: Completed/Met Date Met:  03/25/14 Goal: Activity appropriate for discharge plan Outcome: Completed/Met Date Met:  03/25/14 Goal: Hemodynamically stable Outcome: Completed/Met Date Met:  07/86/75 Goal: Complications resolved/controlled Outcome: Completed/Met Date Met:  03/25/14 Goal: Other Discharge Outcomes/Goals Outcome: Completed/Met Date Met:  03/25/14

## 2014-03-25 NOTE — Plan of Care (Signed)
Problem: Discharge Progression Outcomes Goal: Flu vaccine received if indicated Outcome: Not Applicable Date Met:  59/74/16 Already received Goal: Pneumonia vaccine received if indicated Outcome: Completed/Met Date Met:  03/25/14 Received 11/3

## 2014-03-25 NOTE — Plan of Care (Signed)
Problem: Phase II Progression Outcomes Goal: ADLs completed with minimal assistance Outcome: Adequate for Discharge Discharging to assisted living  Problem: Discharge Progression Outcomes Goal: Dyspnea controlled Outcome: Completed/Met Date Met:  03/25/14 Goal: O2 sats > or equal 90% or at baseline Outcome: Completed/Met Date Met:  03/25/14 93% on room air Goal: Home O2 if indicated Outcome: Not Applicable Date Met:  17/61/60 Goal: Able to self administer respiratory meds Outcome: Not Applicable Date Met:  73/71/06 Pt d/c to assisted living Goal: Independent ADLs or Home Health Care Outcome: Adequate for Discharge D/c to assisted living Goal: Barriers To Progression Addressed/Resolved Outcome: Completed/Met Date Met:  03/25/14

## 2014-03-25 NOTE — Discharge Summary (Signed)
DISCHARGE SUMMARY  [name]  MR#: [id number]  DOB:1922/11/13  Date of Admission: 03/17/2014 Date of Discharge: 03/25/2014  Attending Physician:[name]  Patient's ZSM:OLMBE,[id number] Diana Haste, MD  Consults: none  Discharge Diagnoses: PNEUMOCOCCAL BILATERAL UPPER LOBE HCAP:   ANEMIA:  DEMENTIA, ADVANCED:  AFIB:   NON HODGKINS LYMPHOMA:  ADULT FTT/MALNUTRITION:  HYPERLIPIDEMIA:  DEPRESSION:  GOITER:  IMPAIRED GLUCOSE TOLERANCE SMALL BILATERAL PLEURAL EFFUSIONS HX OF SPLENECTOMY CODE STATUS: DNR  Discharge Medications:   Medication List    STOP taking these medications        potassium chloride SA 20 MEQ tablet  Commonly known as:  K-DUR,KLOR-CON      TAKE these medications        acetaminophen 500 MG tablet  Commonly known as:  TYLENOL  Take 500 mg by mouth every 6 (six) hours as needed for mild pain.     amLODipine 5 MG tablet  Commonly known as:  NORVASC  Take 1 tablet (5 mg total) by mouth daily.     aspirin 81 MG tablet  Take 81 mg by mouth daily.     digoxin 0.125 MG tablet  Commonly known as:  LANOXIN  Take 125 mcg by mouth daily.     docusate sodium 100 MG capsule  Commonly known as:  COLACE  Take 100 mg by mouth daily.     donepezil 10 MG tablet  Commonly known as:  ARICEPT  Take 10 mg by mouth at bedtime.     ENSURE PO  Take 1 Can by mouth 3 (three) times daily. Strawberry     escitalopram 10 MG tablet  Commonly known as:  LEXAPRO  Take 10 mg by mouth daily.     fenofibrate 160 MG tablet  Take 80 mg by mouth daily.     ferrous sulfate 325 (65 FE) MG tablet  Take 325 mg by mouth 2 (two) times daily.     fish oil-omega-3 fatty acids 1000 MG capsule  Take 1 g by mouth daily.     lansoprazole 15 MG capsule  Commonly known as:  PREVACID  Take 15 mg by mouth daily as needed (acid reflux).     levofloxacin 500 MG tablet  Commonly known as:  LEVAQUIN  Take 1 tablet (500 mg total) by mouth daily.     LORazepam 0.5  MG tablet  Commonly known as:  ATIVAN  Take 0.25 mg by mouth every 6 (six) hours as needed for anxiety.     LYRICA 100 MG capsule  Generic drug:  pregabalin  Take 100 mg by mouth at bedtime.     metoprolol succinate 25 MG 24 hr tablet  Commonly known as:  TOPROL-XL  Take 25 mg by mouth daily.     multivitamin-prenatal 27-0.8 MG Tabs tablet  Take 1 tablet by mouth daily.     oxyCODONE-acetaminophen 5-325 MG per tablet  Commonly known as:  PERCOCET/ROXICET  Take 1 tablet by mouth daily as needed for moderate pain or severe pain.     oxyCODONE-acetaminophen 5-325 MG per tablet  Commonly known as:  PERCOCET/ROXICET  Take 1 tablet by mouth every 8 (eight) hours as needed for severe pain.     risperiDONE 0.5 MG tablet  Commonly known as:  RISPERDAL  Take 0.5 mg by mouth at bedtime.     risperiDONE 0.25 MG tablet  Commonly known as:  RISPERDAL  Take 0.25 mg by mouth at bedtime.     VITAMIN D PO  Take 1 tablet by  mouth daily.        Hospital Procedures: Dg Chest 2 View  03/23/2014   CLINICAL DATA:  Pneumonia.  EXAM: CHEST  2 VIEW  COMPARISON:  03/19/2014  FINDINGS: Increased opacification at the left base, mainly pleural fluid. Small right pleural effusion also present. Bilateral upper lobe opacity has decreased, but is still readily apparent on the left. Unchanged cardiomegaly. Pulmonary venous congestion. No pneumothorax.  IMPRESSION: 1. Small bilateral pleural effusions, increased on the left. 2. Improving apical aeration. Pneumonia is still visible in the left upper lobe.   Electronically Signed   By: Jorje Guild M.D.   On: 03/23/2014 08:26   Dg Chest 2 View  03/19/2014   CLINICAL DATA:  Productive cough and weakness  EXAM: CHEST  2 VIEW  COMPARISON:  03/17/2014  FINDINGS: Cardiac shadow is mildly enlarged. Increased density is again noted in the left apex similar to that seen on the prior exam. Some increased infiltrate is noted in the right upper lobe medially which was  less well visualized on the prior study. Left pleural effusion is again noted. Diffuse interstitial changes are noted.  IMPRESSION: Persistent left upper end new right upper lobe infiltrates. Continued followup is recommended.   Electronically Signed   By: Inez Catalina M.D.   On: 03/19/2014 10:35   Dg Chest 2 View  03/17/2014   CLINICAL DATA:  Productive cough. Low oxygen saturations. History of COPD.  EXAM: CHEST  2 VIEW  COMPARISON:  04/02/2013 and 07/21/2009  FINDINGS: Two views of the chest demonstrate hyperinflation. There are new densities in the left upper lung. There is also blunting at the left costophrenic angle which is new. Images of the left upper lung are slightly limited due to patient rotation. Heart size is within normal limits. Chronic linear densities in the anterior chest.  IMPRESSION: Airspace disease in the left upper lung and suspect a small left pleural effusion. Findings are most compatible with pneumonia. Recommend follow up to ensure resolution.   Electronically Signed   By: Markus Daft M.D.   On: 03/17/2014 19:47    History of Present Illness: Cough and fever  Hospital Course: 80 YO WF with Afib, dementia, hx NHL, presented with fever and cough. Found to have LUL then later RUL infiltrates. She did have positive blood cultures but was never actually septic. Some bronchospasm was improved with inhalers and steroids. She has improved signficantly. Her cough is minimal She is eating better. BP is fine. She has no complaints. Her fever curve has been fine for several days PNEUMOCOCCAL BILATERAL UPPER LOBE HCAP:  Finishing out 3 more days of oral levaquin ANEMIA: better at 12.3 DEMENTIA, ADVANCED:a bit better this SM AFIB: stable rate, off anticoag due to GI bleeding, rate controlled NON HODGKINS LYMPHOMA: seems OK ADULT FTT/MALNUTRITION: looks progressive HYPERLIPIDEMIA: on Rx DEPRESSION: seems OK  GOITER: TSH fine CODE STATUS: DNR  Day of Discharge Exam BP 135/72  mmHg  Pulse 93  Temp(Src) 98.2 F (36.8 C) (Oral)  Resp 20  Ht 5\' 7"  (1.702 m)  Wt 47.8 kg (105 lb 6.1 oz)  BMI 16.50 kg/m2  SpO2 91%       Physical Exam: General appearance: frail but  Non toxic, sitting up in no distress Eyes: no scleral icterus, no nystagmus Throat: oropharynx moist without erythema Resp: relatively clear, no wheeze Cardio: IR IR with murmur GI: soft, non-tender; bowel sounds normal; no masses,  no organomegaly Extremities: thin with no edema Awake, interactive calm confused,  clear fluent speech  Discharge Labs:  Recent Labs  03/23/14 0718  NA 139  K 4.5  CL 98  CO2 32  GLUCOSE 103*  BUN 28*  CREATININE 0.46*  CALCIUM 10.3    Recent Labs  03/23/14 0718  AST 26  ALT 20  ALKPHOS 69  BILITOT 0.4  PROT 6.6  ALBUMIN 2.1*    Recent Labs  03/23/14 0718  WBC 8.1  HGB 12.3  HCT 37.1  MCV 87.1  PLT 419*   Lab Results  Component Value Date   INR 1.18 03/17/2014   INR 1.34 12/07/2012   INR 2.60* 07/24/2009     8d ago    Specimen Description BLOOD RIGHT HAND   Special Requests BOTTLES DRAWN AEROBIC AND ANAEROBIC 5CC   Culture Setup Time 03/18/2014 02:25  Performed at [place]  Note: Gram Stain Report Called to,Read Back By and Verified With: JESSICA DEUTSCH 03/18/14 1055 BY SMITHERSJ  Performed at [facility]       Report Status 03/20/2014 FINAL   Organism ID, Bacteria STREPTOCOCCUS PNEUMONIAE   Resulting Agency [facility]    Culture & Susceptibility      STREPTOCOCCUS PNEUMONIAE     Antibiotic Sensitivity Microscan Status    CEFTRIAXONE Sensitive 0.016 SENSITIVE Final    Method: MIC (ETEST)    LEVOFLOXACIN Sensitive 1.0 SENSITIVE Final    Method: MIC (ETEST)    PENICILLIN Sensitive 0.023 SENSITIVE Final    Method: MIC (ETEST)    Comments STREPTOCOCCUS PNEUMONIAE (MIC (ETEST))    STREPTOCOCCUS PNEUMONIAE             Discharge  instructions:   Disposition: to skilled care  Follow-up Appts: Follow-up with Dr. Forde Dandy at [facility] in 2 weeks  Call for appointment.  Condition on Discharge: improved  Tests Needing Follow-up: None  Signed: Jacki Couse [name] 03/25/2014, 10:12 AM

## 2014-03-25 NOTE — Progress Notes (Signed)
Physical Therapy Treatment Patient Details Name: [name] MRN: [id number] DOB: December 31, 1922 Today's Date: 03/25/2014    History of Present Illness 80 yo female admitted with Pna, weakness. Hx of NHL, splenectomy, COPD, dementia, osteoporosis. Pt is from ALF    PT Comments    Assisted pt OOB to amb pt demon increased fatigue and c/o feeling really tired.  Limited and decreased amb distance this session.  Pt still requires Min Assist for all mobility and cueing for safety.  Assisted pt back to bed per pt request.     Follow Up Recommendations  SNF (family are requesting to have pt return to ALF)     Equipment Recommendations       Recommendations for Other Services       Precautions / Restrictions Precautions Precautions: Fall Precaution Comments: chair alarm    Mobility  Bed Mobility Overal bed mobility: Needs Assistance Bed Mobility: Supine to Sit;Sit to Supine     Supine to sit: Min assist Sit to supine: Min assist   General bed mobility comments: increased time and 25% VC's on proper tech   Transfers Overall transfer level: Needs assistance Equipment used: Rolling walker (2 wheeled) Transfers: Sit to/from Stand Sit to Stand: Min assist         General transfer comment: 50% VC's on proper hand placement and safety with turn completion prior to sit.  Ambulation/Gait Ambulation/Gait assistance: Min assist Ambulation Distance (Feet): 22 Feet Assistive device: Rolling walker (2 wheeled) Gait Pattern/deviations: Step-to pattern;Step-through pattern Gait velocity: decreased   General Gait Details: decreased amb distance this session due to MAX c/o fatigue/weakness "I'm so tired".  RA avg 91%.   Stairs            Wheelchair Mobility    Modified Rankin (Stroke Patients Only)       Balance                                    Cognition                            Exercises      General Comments         Pertinent Vitals/Pain      Home Living                      Prior Function            PT Goals (current goals can now be found in the care plan section) Progress towards PT goals: Progressing toward goals    Frequency  Min 3X/week    PT Plan      Co-evaluation             End of Session Equipment Utilized During Treatment: Gait belt Activity Tolerance: Patient limited by fatigue Patient left: in bed;with call bell/phone within reach;with bed alarm set     Time: 1478-2956 PT Time Calculation (min): 27 min  Charges:  $Gait Training: 8-22 mins $Therapeutic Activity: 8-22 mins                    G Codes:      Rica Koyanagi  PTA [facility]  Acute  Rehab Pager      [phone]

## 2014-03-31 ENCOUNTER — Emergency Department (HOSPITAL_COMMUNITY): Payer: Medicare Other

## 2014-03-31 ENCOUNTER — Encounter (HOSPITAL_COMMUNITY): Payer: Self-pay | Admitting: Emergency Medicine

## 2014-03-31 ENCOUNTER — Emergency Department (HOSPITAL_COMMUNITY)
Admission: EM | Admit: 2014-03-31 | Discharge: 2014-03-31 | Disposition: A | Discharge status: Home / Self Care | Payer: Medicare Other | Attending: Emergency Medicine | Admitting: Emergency Medicine

## 2014-03-31 DIAGNOSIS — E785 Hyperlipidemia, unspecified: Secondary | ICD-10-CM | POA: Insufficient documentation

## 2014-03-31 DIAGNOSIS — I10 Essential (primary) hypertension: Secondary | ICD-10-CM | POA: Insufficient documentation

## 2014-03-31 DIAGNOSIS — Z859 Personal history of malignant neoplasm, unspecified: Secondary | ICD-10-CM | POA: Insufficient documentation

## 2014-03-31 DIAGNOSIS — S61412A Laceration without foreign body of left hand, initial encounter: Secondary | ICD-10-CM | POA: Diagnosis not present

## 2014-03-31 DIAGNOSIS — Z8572 Personal history of non-Hodgkin lymphomas: Secondary | ICD-10-CM | POA: Diagnosis not present

## 2014-03-31 DIAGNOSIS — Y9389 Activity, other specified: Secondary | ICD-10-CM | POA: Diagnosis not present

## 2014-03-31 DIAGNOSIS — Z880 Allergy status to penicillin: Secondary | ICD-10-CM | POA: Insufficient documentation

## 2014-03-31 DIAGNOSIS — Z79899 Other long term (current) drug therapy: Secondary | ICD-10-CM | POA: Diagnosis not present

## 2014-03-31 DIAGNOSIS — S065XAA Traumatic subdural hemorrhage with loss of consciousness status unknown, initial encounter: Secondary | ICD-10-CM

## 2014-03-31 DIAGNOSIS — Y998 Other external cause status: Secondary | ICD-10-CM | POA: Insufficient documentation

## 2014-03-31 DIAGNOSIS — S06300A Unspecified focal traumatic brain injury without loss of consciousness, initial encounter: Secondary | ICD-10-CM | POA: Diagnosis not present

## 2014-03-31 DIAGNOSIS — Z23 Encounter for immunization: Secondary | ICD-10-CM | POA: Insufficient documentation

## 2014-03-31 DIAGNOSIS — L03115 Cellulitis of right lower limb: Secondary | ICD-10-CM | POA: Diagnosis not present

## 2014-03-31 DIAGNOSIS — M810 Age-related osteoporosis without current pathological fracture: Secondary | ICD-10-CM | POA: Diagnosis not present

## 2014-03-31 DIAGNOSIS — Z7982 Long term (current) use of aspirin: Secondary | ICD-10-CM | POA: Insufficient documentation

## 2014-03-31 DIAGNOSIS — S065X0A Traumatic subdural hemorrhage without loss of consciousness, initial encounter: Secondary | ICD-10-CM | POA: Diagnosis not present

## 2014-03-31 DIAGNOSIS — Y92129 Unspecified place in nursing home as the place of occurrence of the external cause: Secondary | ICD-10-CM | POA: Insufficient documentation

## 2014-03-31 DIAGNOSIS — S0101XA Laceration without foreign body of scalp, initial encounter: Secondary | ICD-10-CM | POA: Diagnosis not present

## 2014-03-31 DIAGNOSIS — W1839XA Other fall on same level, initial encounter: Secondary | ICD-10-CM | POA: Diagnosis not present

## 2014-03-31 DIAGNOSIS — S065X9A Traumatic subdural hemorrhage with loss of consciousness of unspecified duration, initial encounter: Secondary | ICD-10-CM

## 2014-03-31 DIAGNOSIS — F0390 Unspecified dementia without behavioral disturbance: Secondary | ICD-10-CM | POA: Insufficient documentation

## 2014-03-31 DIAGNOSIS — I619 Nontraumatic intracerebral hemorrhage, unspecified: Secondary | ICD-10-CM

## 2014-03-31 DIAGNOSIS — Z8701 Personal history of pneumonia (recurrent): Secondary | ICD-10-CM | POA: Insufficient documentation

## 2014-03-31 DIAGNOSIS — S0990XA Unspecified injury of head, initial encounter: Secondary | ICD-10-CM | POA: Diagnosis present

## 2014-03-31 DIAGNOSIS — W19XXXA Unspecified fall, initial encounter: Secondary | ICD-10-CM

## 2014-03-31 DIAGNOSIS — J449 Chronic obstructive pulmonary disease, unspecified: Secondary | ICD-10-CM | POA: Diagnosis not present

## 2014-03-31 DIAGNOSIS — L03116 Cellulitis of left lower limb: Secondary | ICD-10-CM | POA: Diagnosis not present

## 2014-03-31 HISTORY — DX: Unspecified dementia, unspecified severity, without behavioral disturbance, psychotic disturbance, mood disturbance, and anxiety: F03.90

## 2014-03-31 MED ORDER — LIDOCAINE-EPINEPHRINE 2 %-1:100000 IJ SOLN
20.0000 mL | Freq: Once | INTRAMUSCULAR | Status: AC
  Administered 2014-03-31: 20 mL
  Filled 2014-03-31: qty 1

## 2014-03-31 MED ORDER — TETANUS-DIPHTH-ACELL PERTUSSIS 5-2.5-18.5 LF-MCG/0.5 IM SUSP
0.5000 mL | Freq: Once | INTRAMUSCULAR | Status: AC
  Administered 2014-03-31: 0.5 mL via INTRAMUSCULAR
  Filled 2014-03-31: qty 0.5

## 2014-03-31 MED ORDER — ACETAMINOPHEN 325 MG PO TABS
650.0000 mg | ORAL_TABLET | Freq: Once | ORAL | Status: AC
  Administered 2014-03-31: 650 mg via ORAL
  Filled 2014-03-31: qty 2

## 2014-03-31 NOTE — ED Notes (Signed)
Per EMS: Pt from memory care unit at [facility]. Pt was sitting in recliner, had unwitnessed fall to floor. Pt has hematoma to posterior L head, full thickness skin tear to top of L hand, 2 small skin tears to inside of L wrist. Pt oriented per her norm per nursing home staff. Staff say that they heard her fall and they went in right away and pt was alert, so they do not believe she lost consciousness. No pain with palpation to back or neck. Pt not on blood thinners.

## 2014-03-31 NOTE — ED Provider Notes (Signed)
CSN: [id number]     Arrival date & time 03/31/14  1953 History   First MD Initiated Contact with Patient 03/31/14 2014     Chief Complaint  Patient presents with  . Fall     (Consider location/radiation/quality/duration/timing/severity/associated sxs/prior Treatment) HPI  80 year old female brought in after a fall at her nursing home. Patient was discharged to this facility after being admitted for pneumonia earlier this month. From a respiratory table with the patient doing better. She's not on regular oxygen. Patient was having assistance in the assistance had to leave the room temporarily and they heard her fall shortly after. They medially went into the room and found her conscious and do not believe she lost consciousness. The patient is demented and unable to provide a history. Daughter states that she is acting at her baseline now but injured her left scalp. Patient also suffered skin tears to her left arm. Patient not on a blood thinners besides aspirin. Patient is currently being treated for bilateral cellulitis by her PCP with antibiotics started yesterday. These areas of swelling have not worsened.  Past Medical History  Diagnosis Date  . Cancer   . Lymphoma, high grade 07/23/2011  . Benign essential HTN 07/23/2011  . Hyperlipidemia 07/23/2011  . 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 DJD (degenerative joint disease), lumbar 07/23/2011  . Current non-smoker but past smoking history unknown 12/08/12  . COPD (chronic obstructive pulmonary disease)   . Dementia    Past Surgical History  Procedure Laterality Date  . Colon surgery    . Appendectomy    . Abdominal hysterectomy    . Hernia repair    . Tonsillectomy    . Splenectomy    . Shoulder surgery    . Hemorroidectomy    . Esophagogastroduodenoscopy N/A 12/08/2012    Procedure: ESOPHAGOGASTRODUODENOSCOPY (EGD);  Surgeon: Wonda Horner, MD;  Location: [facility];  Service: Endoscopy;  Laterality: N/A;  .  Esophagogastroduodenoscopy N/A 04/10/2013    Procedure: ESOPHAGOGASTRODUODENOSCOPY (EGD);  Surgeon: Winfield Cunas., MD;  Location: [facility];  Service: Endoscopy;  Laterality: N/A;   Family History  Problem Relation Age of Onset  . Cancer - Other Mother     liver  . Stroke Father   . Cancer Brother     prostate/colon  . Dementia Brother   . Dementia Brother    History  Substance Use Topics  . Smoking status: Never Smoker   . Smokeless tobacco: Never Used  . Alcohol Use: No   OB History    No data available     Review of Systems  Unable to perform ROS: Dementia      Allergies  Iodine; Penicillins; and Shellfish-derived products  Home Medications   Prior to Admission medications   Medication Sig Start Date End Date Taking? Authorizing Provider  acetaminophen (TYLENOL) 500 MG tablet Take 500 mg by mouth every 6 (six) hours as needed for mild pain.    Yes Historical Provider, MD  amLODipine (NORVASC) 5 MG tablet Take 1 tablet (5 mg total) by mouth daily. 03/25/14  Yes Sheela Stack, MD  aspirin 81 MG tablet Take 81 mg by mouth daily.   Yes Historical Provider, MD  Cholecalciferol (VITAMIN D PO) Take 1 tablet by mouth daily.   Yes Historical Provider, MD  digoxin (LANOXIN) 0.125 MG tablet Take 125 mcg by mouth daily.  06/10/11  Yes Historical Provider, MD  docusate sodium (COLACE) 100 MG capsule Take 100 mg by mouth daily.  Yes Historical Provider, MD  donepezil (ARICEPT) 10 MG tablet Take 10 mg by mouth at bedtime.   Yes Historical Provider, MD  escitalopram (LEXAPRO) 10 MG tablet Take 10 mg by mouth daily.   Yes Historical Provider, MD  fenofibrate 160 MG tablet Take 80 mg by mouth daily.  06/02/11  Yes Historical Provider, MD  ferrous sulfate 325 (65 FE) MG tablet Take 325 mg by mouth 2 (two) times daily.   Yes Historical Provider, MD  fish oil-omega-3 fatty acids 1000 MG capsule Take 1 g by mouth daily.   Yes Historical Provider, MD  lansoprazole (PREVACID) 15  MG capsule Take 15 mg by mouth daily as needed (acid reflux).    Yes Historical Provider, MD  LORazepam (ATIVAN) 0.5 MG tablet Take 0.25 mg by mouth every 6 (six) hours as needed for anxiety.   Yes Historical Provider, MD  LYRICA 100 MG capsule Take 100 mg by mouth at bedtime.  07/09/11  Yes Historical Provider, MD  metoprolol succinate (TOPROL-XL) 25 MG 24 hr tablet Take 25 mg by mouth daily.   Yes Historical Provider, MD  Nutritional Supplements (ENSURE PO) Take 1 Can by mouth 3 (three) times daily. Strawberry   Yes Historical Provider, MD  oxyCODONE-acetaminophen (PERCOCET/ROXICET) 5-325 MG per tablet Take 1 tablet by mouth every 8 (eight) hours as needed for severe pain. Patient taking differently: Take 1 tablet by mouth daily as needed for moderate pain or severe pain.  10/19/13  Yes Domenic Moras, PA-C  oxyCODONE-acetaminophen (PERCOCET/ROXICET) 5-325 MG per tablet Take 1 tablet by mouth 2 (two) times daily.    Yes Historical Provider, MD  Prenatal Vit-Fe Fumarate-FA (MULTIVITAMIN-PRENATAL) 27-0.8 MG TABS Take 1 tablet by mouth daily.   Yes Historical Provider, MD  risperiDONE (RISPERDAL) 0.25 MG tablet Take 0.25 mg by mouth at bedtime.   Yes Historical Provider, MD  risperiDONE (RISPERDAL) 0.5 MG tablet Take 0.5 mg by mouth at bedtime.   Yes Historical Provider, MD  levofloxacin (LEVAQUIN) 500 MG tablet Take 1 tablet (500 mg total) by mouth daily. 03/25/14   Sheela Stack, MD   BP 152/73 mmHg  Pulse 66  Temp(Src) 97.7 F (36.5 C) (Oral)  Resp 16  SpO2 91%  LMP  (Exact Date) Physical Exam  Constitutional: She is oriented to person, place, and time. She appears well-developed and well-nourished. No distress.  HENT:  Head: Normocephalic.    Right Ear: External ear normal.  Left Ear: External ear normal.  Nose: Nose normal.  Eyes: Right eye exhibits no discharge. Left eye exhibits no discharge.  Cardiovascular: Normal rate, regular rhythm and normal heart sounds.   Pulmonary/Chest:  Effort normal and breath sounds normal.  Abdominal: Soft. There is no tenderness.  Musculoskeletal:       Left forearm: She exhibits laceration.       Arms:      Left hand: She exhibits laceration.       Hands: Bilateral lower extremity cellulitis to proximal lower leg distal to knee  Neurological: She is alert and oriented to person, place, and time.  Skin: Skin is warm and dry.  Vitals reviewed.   ED Course  LACERATION REPAIR Date/Time: 03/31/2014 10:53 PM Performed by: Sherwood Gambler T Authorized by: Sherwood Gambler T Consent: Verbal consent obtained. Risks and benefits: risks, benefits and alternatives were discussed Consent given by: power of attorney Body area: head/neck Location details: scalp Laceration length: 3 cm Foreign bodies: no foreign bodies Tendon involvement: none Nerve involvement: none Vascular damage:  no Anesthesia: local infiltration Local anesthetic: lidocaine 2% with epinephrine Anesthetic total: 3 ml Patient sedated: no Preparation: Patient was prepped and draped in the usual sterile fashion. Irrigation solution: saline Irrigation method: syringe Amount of cleaning: standard Debridement: none Degree of undermining: none Skin closure: staples Number of sutures: 4 Technique: simple Approximation: close Approximation difficulty: simple Dressing: 4x4 sterile gauze and antibiotic ointment Patient tolerance: Patient tolerated the procedure well with no immediate complications   (including critical care time) Labs Review Labs Reviewed - No data to display  Imaging Review Ct Head Wo Contrast  03/31/2014   ADDENDUM REPORT: 03/31/2014 22:17  ADDENDUM: These results were called by telephone at the time of interpretation on 03/31/2014 at 10:17 pm to Dr. Sherwood Gambler , who verbally acknowledged these results.   Electronically Signed   By: Inez Catalina M.D.   On: 03/31/2014 22:17   03/31/2014   CLINICAL DATA:  Unwitnessed fall with scalp hematoma,  no definitive loss of consciousness  EXAM: CT HEAD WITHOUT CONTRAST  CT CERVICAL SPINE WITHOUT CONTRAST  TECHNIQUE: Multidetector CT imaging of the head and cervical spine was performed following the standard protocol without intravenous contrast. Multiplanar CT image reconstructions of the cervical spine were also generated.  COMPARISON:  10/19/2013  FINDINGS: CT HEAD FINDINGS  The bony calvarium is intact. A large left scalp hematoma is noted in the parietal region. Diffuse atrophic changes are noted. A small area parenchymal hemorrhage is noted measuring well by 4 mm in the posterior parietal lobe on the left near the vertex. This is best seen on image 19 of series 3. Second area of hemorrhage is noted in the right parietal lobe near the vertex. This is best seen on image number 25 of series 3. The third small areas also noted posteriorly in the right parietal lobe best seen on image number 23 of series 3. Small subdural hematoma is noted in the right posterior parietal region which measures approximately 4 mm. No significant midline shift is noted. No other subdural collection is seen. No subarachnoid hemorrhage is seen.  CT CERVICAL SPINE FINDINGS  Seven cervical segments are well visualized. Multilevel disc space narrowing and facet hypertrophic changes are seen. No acute fracture is noted. Heavy carotid calcifications are noted in the region of the carotid bifurcations. Surrounding soft tissue structures are within normal limits. Some mild atelectasis is noted in the left upper lobe. No pneumothorax is seen.  IMPRESSION: CT of the head: Areas of parenchymal hemorrhage within the parietal lobes as described  Small right subdural hematoma without significant midline shift.  Diffuse atrophic changes.  CT of the cervical spine: Multilevel degenerative change without acute abnormality.  Electronically Signed: By: Inez Catalina M.D. On: 03/31/2014 21:45   Ct Cervical Spine Wo Contrast  03/31/2014   ADDENDUM  REPORT: 03/31/2014 22:17  ADDENDUM: These results were called by telephone at the time of interpretation on 03/31/2014 at 10:17 pm to Dr. Sherwood Gambler , who verbally acknowledged these results.   Electronically Signed   By: Inez Catalina M.D.   On: 03/31/2014 22:17   03/31/2014   CLINICAL DATA:  Unwitnessed fall with scalp hematoma, no definitive loss of consciousness  EXAM: CT HEAD WITHOUT CONTRAST  CT CERVICAL SPINE WITHOUT CONTRAST  TECHNIQUE: Multidetector CT imaging of the head and cervical spine was performed following the standard protocol without intravenous contrast. Multiplanar CT image reconstructions of the cervical spine were also generated.  COMPARISON:  10/19/2013  FINDINGS: CT HEAD FINDINGS  The  bony calvarium is intact. A large left scalp hematoma is noted in the parietal region. Diffuse atrophic changes are noted. A small area parenchymal hemorrhage is noted measuring well by 4 mm in the posterior parietal lobe on the left near the vertex. This is best seen on image 19 of series 3. Second area of hemorrhage is noted in the right parietal lobe near the vertex. This is best seen on image number 25 of series 3. The third small areas also noted posteriorly in the right parietal lobe best seen on image number 23 of series 3. Small subdural hematoma is noted in the right posterior parietal region which measures approximately 4 mm. No significant midline shift is noted. No other subdural collection is seen. No subarachnoid hemorrhage is seen.  CT CERVICAL SPINE FINDINGS  Seven cervical segments are well visualized. Multilevel disc space narrowing and facet hypertrophic changes are seen. No acute fracture is noted. Heavy carotid calcifications are noted in the region of the carotid bifurcations. Surrounding soft tissue structures are within normal limits. Some mild atelectasis is noted in the left upper lobe. No pneumothorax is seen.  IMPRESSION: CT of the head: Areas of parenchymal hemorrhage within  the parietal lobes as described  Small right subdural hematoma without significant midline shift.  Diffuse atrophic changes.  CT of the cervical spine: Multilevel degenerative change without acute abnormality.  Electronically Signed: By: Inez Catalina M.D. On: 03/31/2014 21:45     EKG Interpretation None      MDM   Final diagnoses:  Fall, initial encounter  Scalp laceration, initial encounter  Subdural hematoma  Intraparenchymal hemorrhage of brain  Skin tear of left hand without complication, initial encounter    Patient is at her normal mental baseline. Bleeding is controlled. Scalp wound repaired with staples as above, left hand skin tear repaired with Steri-Strips. CT shows multiple small areas of hemorrhage as above. I discussed with the neurosurgeon on call, Dr. Vertell Limber, who evaluated the images and feels these are trivial and do not require monitoring or any special care. Patient is only on a baby aspirin. I discussed this with the patient's daughter at the bedside who makes decisions for her and she is comfortable with discharge back to the assisted living facility. Patient will be referred back to her PCP for staple removal and follow-up. Patient does have intermittent oxygen saturation stone to 8788% when she is asleep. I feel this is likely related to her recent pneumonia she has no complete some dyspnea and no cough. Did not feel she needs readmission and is stable for discharge for close follow-up.    Ephraim Hamburger, MD 04/01/14 [phone]

## 2014-03-31 NOTE — ED Notes (Signed)
Bed: YB33 Expected date:  Expected time:  Means of arrival:  Comments: EMS/80 yo female fall

## 2014-03-31 NOTE — ED Notes (Signed)
Pt out of room for CT 

## 2014-03-31 NOTE — Discharge Instructions (Signed)

## 2014-05-17 DEATH — deceased

## 2015-04-10 IMAGING — CR DG ABDOMEN ACUTE W/ 1V CHEST
3 series · 3 of 3 positions shown · non-contrast
Comparison: 12/07/2012.

CLINICAL DATA: Pain.  Weakness.

EXAM:
ACUTE ABDOMEN SERIES (ABDOMEN 2 VIEW & CHEST 1 VIEW)

[w abdomen upright]
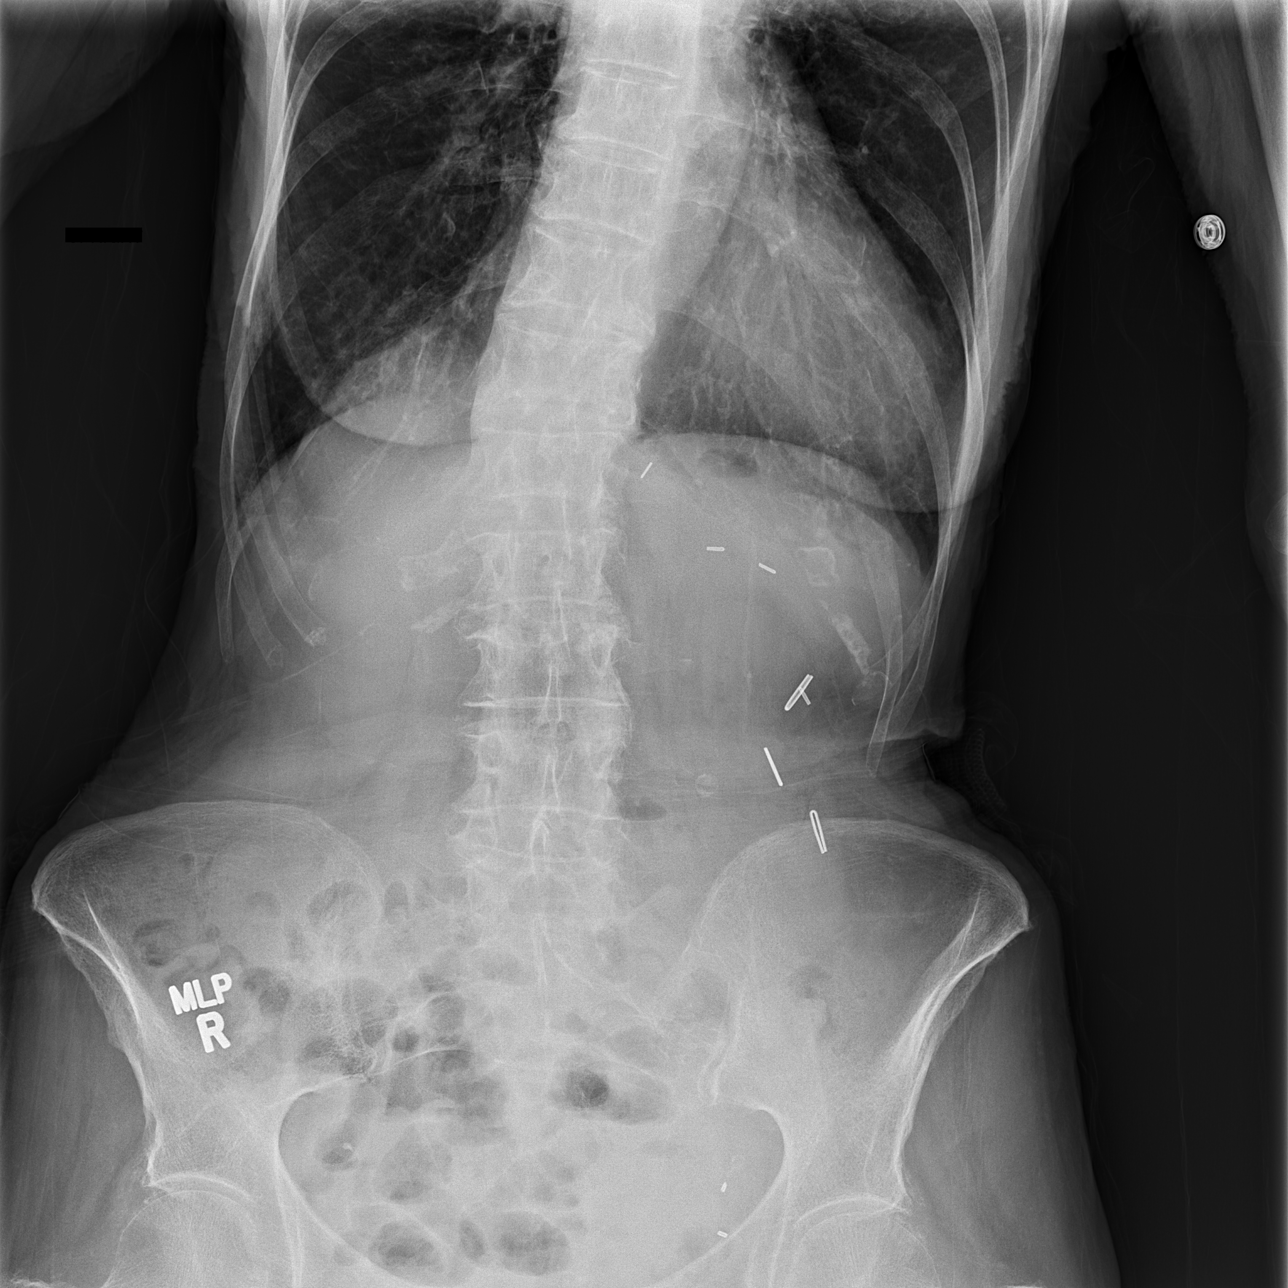

[w chest pa]
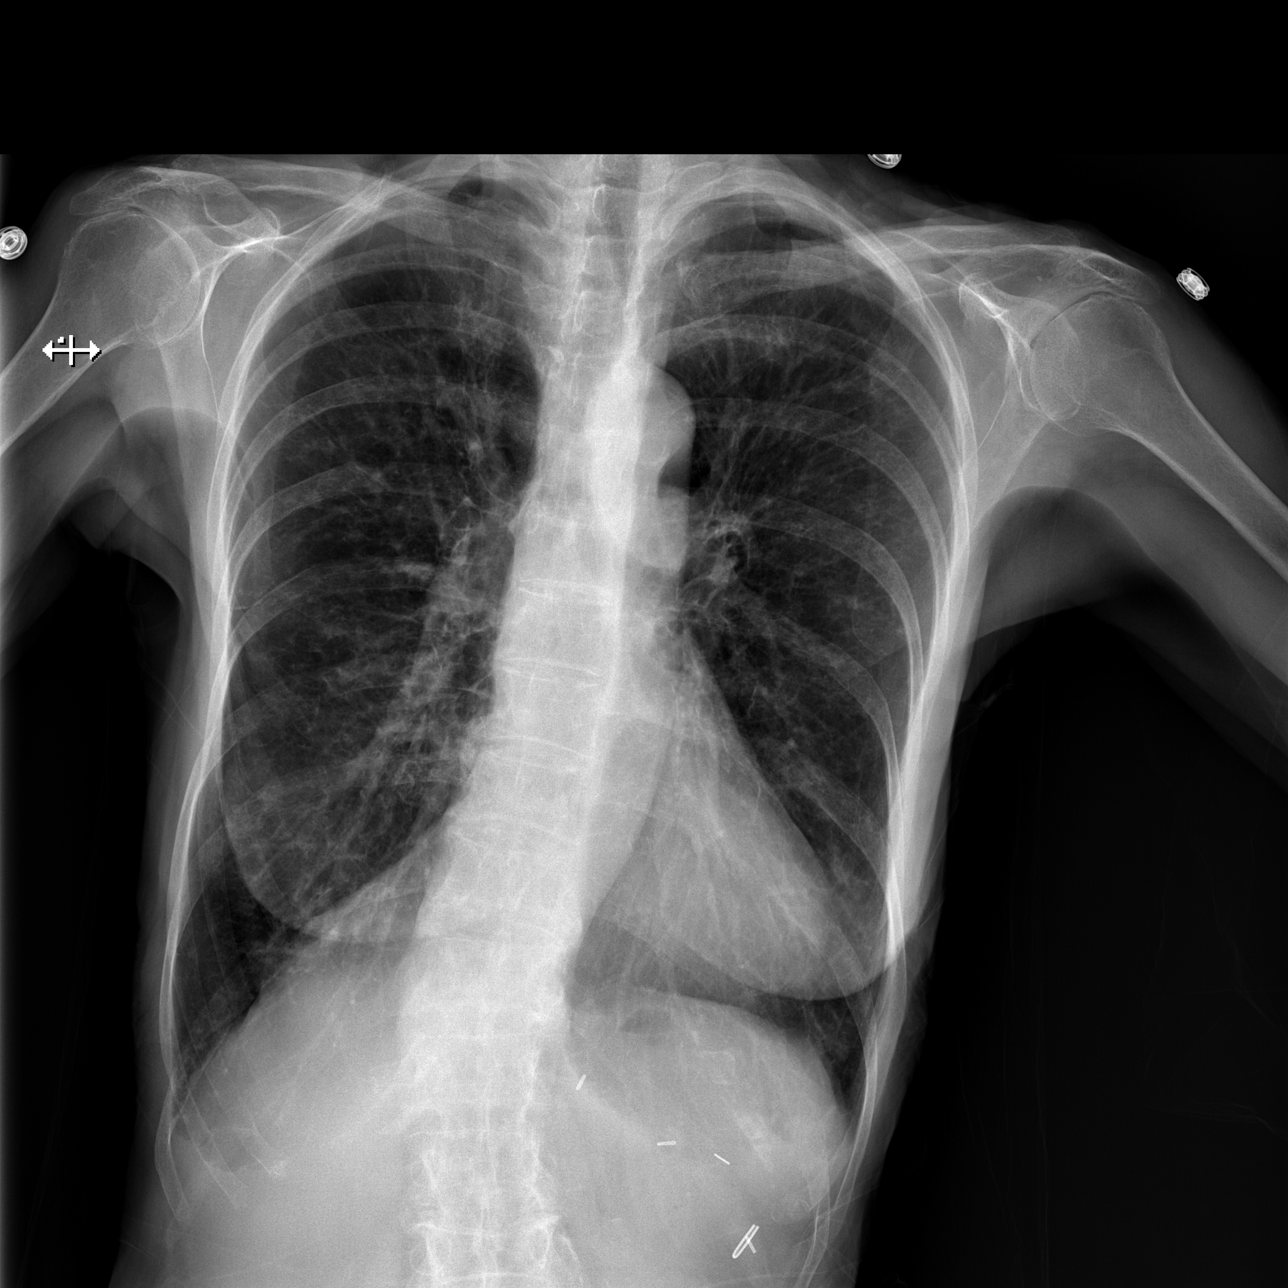

[t abdomen supine]
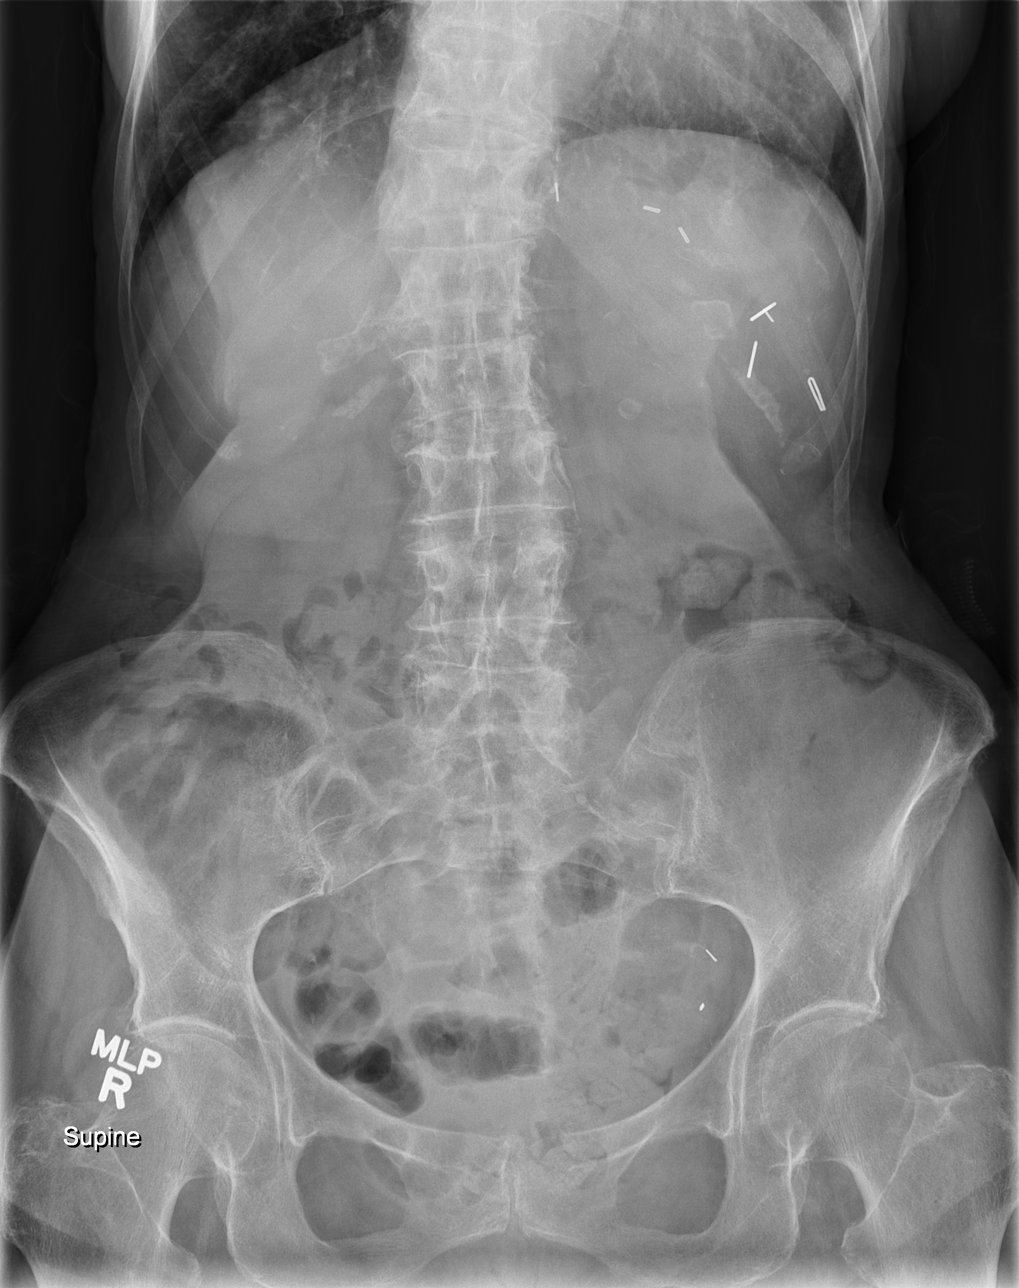

[3 of 3 positions shown; findings below may reference images not displayed]

FINDINGS: Mediastinum and hilar structures normal. Chronic interstitial
prominence noted consistent with interstitial fibrosis. Changes
suggesting COPD. Stable nodularity suggests the possibility of prior
granulomatous disease. No pneumothorax. Cardiomegaly, no CHF.

Surgical clips left upper quadrant. Gas pattern is nonspecific with
stool throughout the colon. No free air. Severe thoracolumbar and
bilateral hip degenerative change. Surgical clips left lower pelvis.
Aortoiliac atherosclerotic vascular disease.
IMPRESSION: 1. No acute abdominal abnormality identified.
2. Chronic interstitial lung disease and COPD.

## 2015-04-10 IMAGING — CT CT ABD-PELV W/O CM
1 of 2 series · 15 of 32 positions shown, 19 images · non-contrast
Comparison: 06/04/2009

CLINICAL DATA: Abdominal pain and nausea.

EXAM:
CT ABDOMEN AND PELVIS WITHOUT CONTRAST
TECHNIQUE: Multidetector CT imaging of the abdomen and pelvis was performed
following the standard protocol without intravenous contrast.

[Series 2: abd/pel w/o · axial · non-contrast · 0.74mm/px · z∈[+1068,+1428]mm · 15 of 80 slices shown, 19 images]
[im 4/80  soft-tissue]
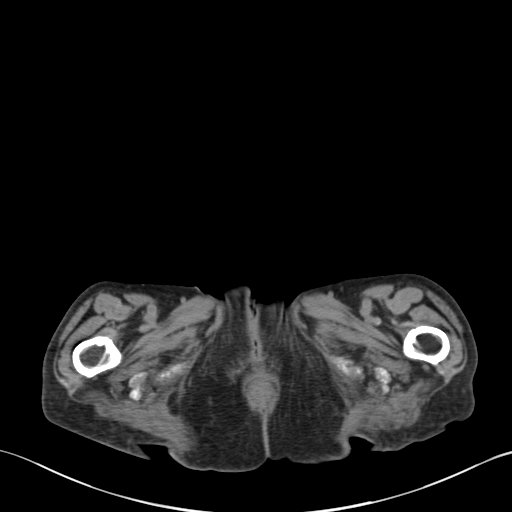
[im 4/80  bone]
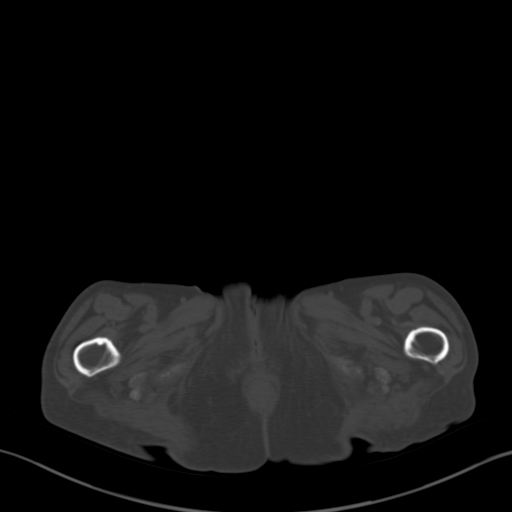
[im 10/80  soft-tissue]
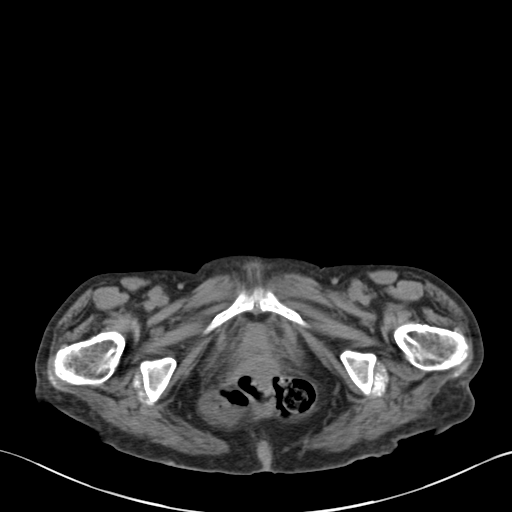
[im 16/80  soft-tissue]
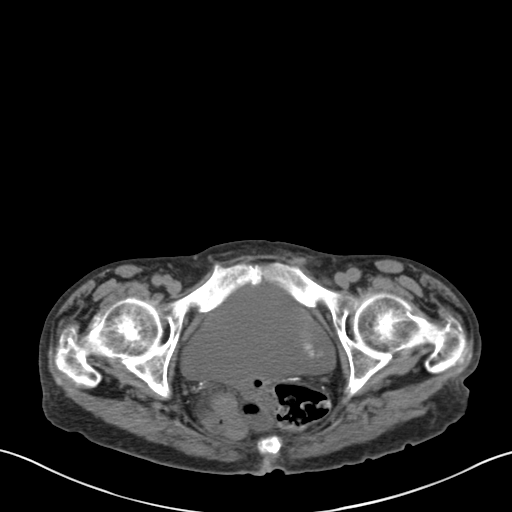
[im 23/80  soft-tissue]
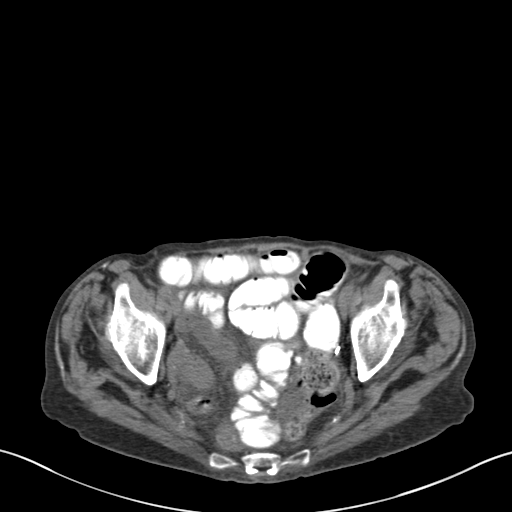
[im 29/80  soft-tissue]
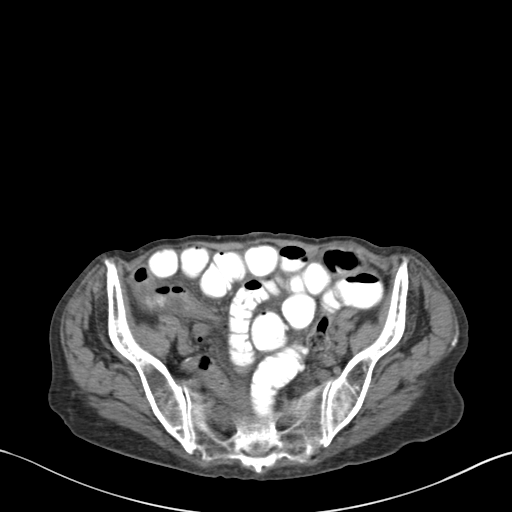
[im 35/80  soft-tissue]
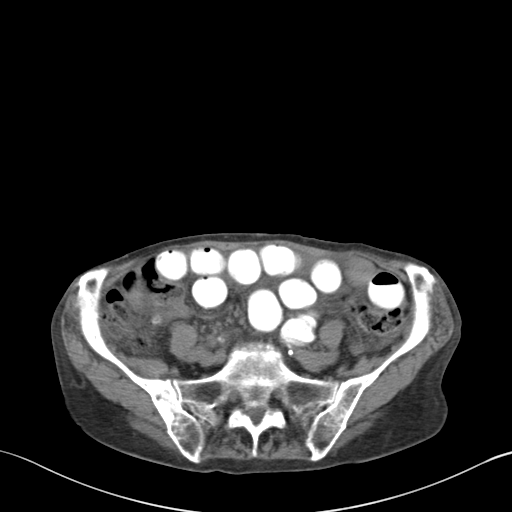
[im 42/80  soft-tissue]
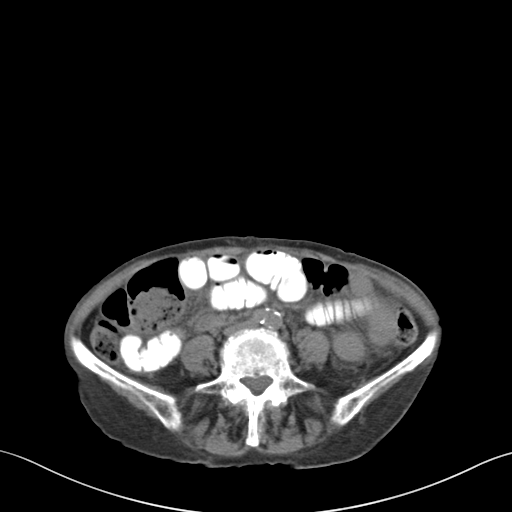
[im 45/80  soft-tissue]
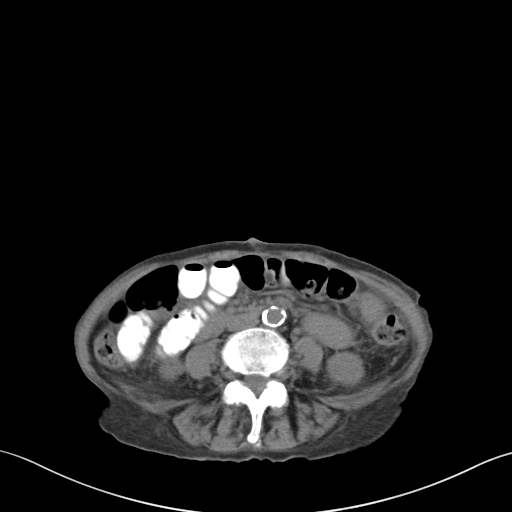
[im 51/80  soft-tissue]
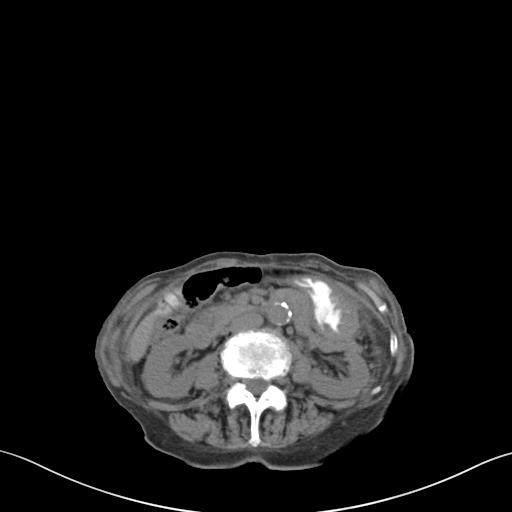
[im 51/80  bone]
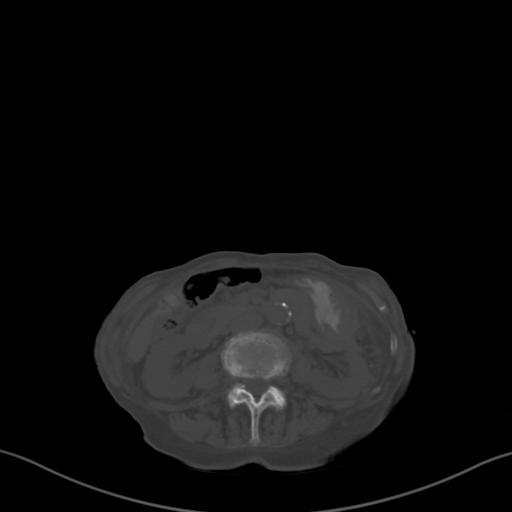
[im 57/80  soft-tissue]
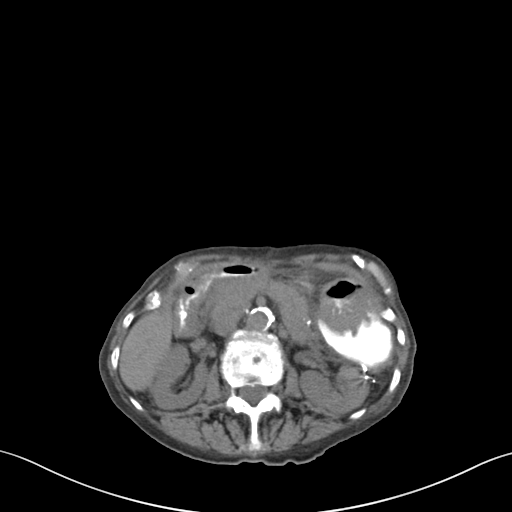
[im 64/80  soft-tissue]
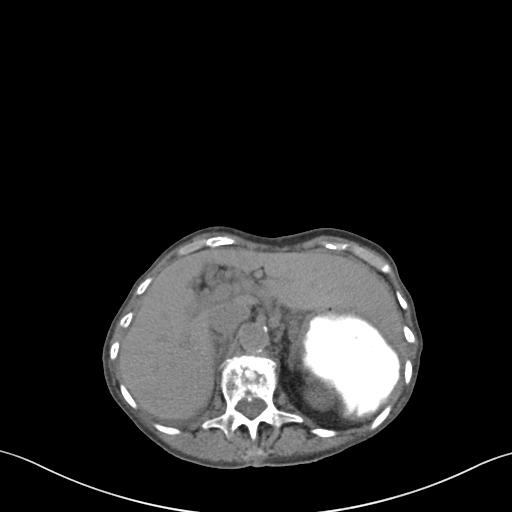
[im 67/80  lung]
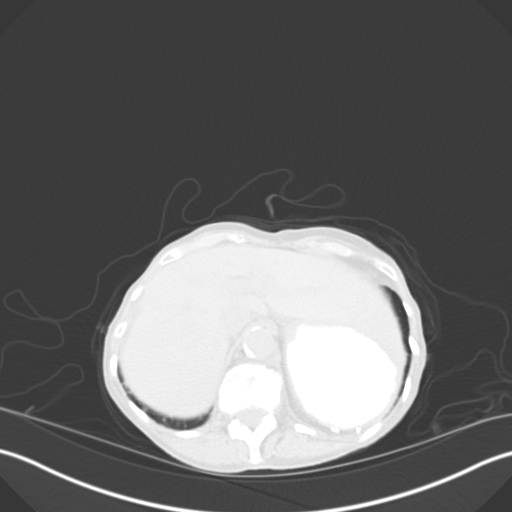
[im 70/80  soft-tissue]
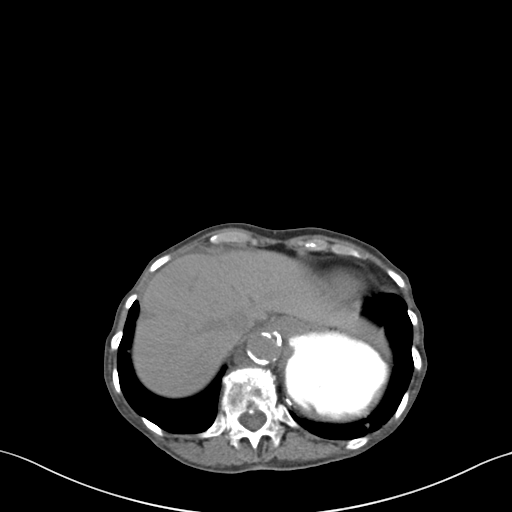
[im 70/80  lung]
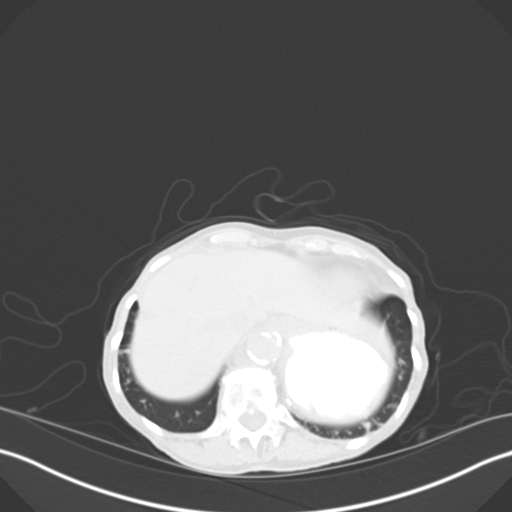
[im 73/80  lung]
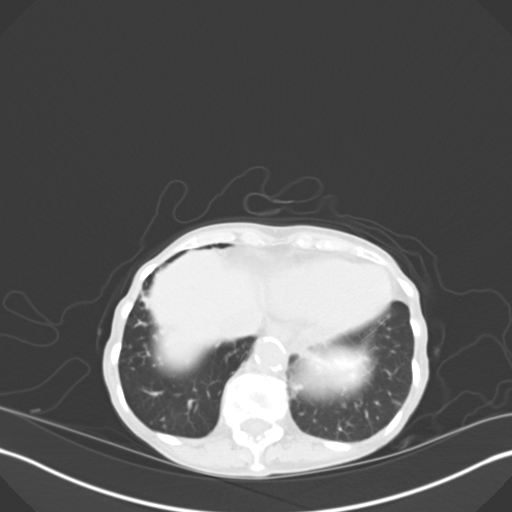
[im 76/80  soft-tissue]
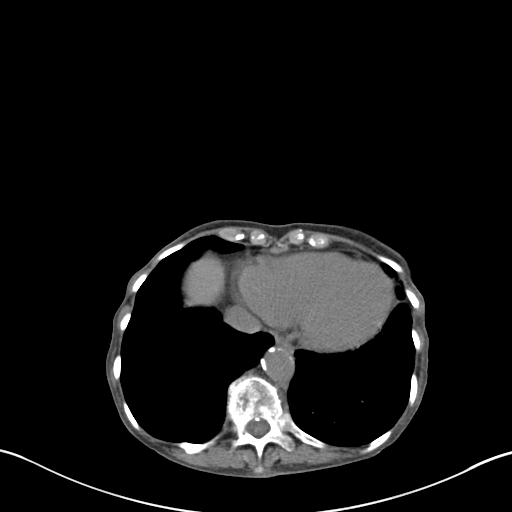
[im 76/80  lung]
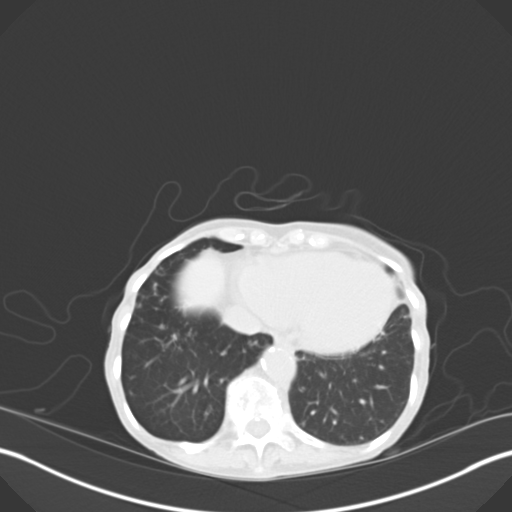

[15 of 32 positions shown; findings below may reference images not displayed]

FINDINGS: The lung bases demonstrate multiple bilateral pulmonary nodules,
peribronchial thickening and tree-in-bud appearance likely
reflecting a chronic inflammatory process or atypical infection. No
pleural effusion.

The unenhanced appearance of the liver is grossly normal. The
gallbladder is unremarkable. No common bile duct dilatation. The
pancreas is grossly normal. The spleen is surgically absent. The
adrenal glands and kidneys are grossly normal. No obvious ureteral
or bladder calculi.

The stomach moderate wall thickening in the body region with
thickened appearing gastric folds. Could not exclude an inflammatory
or infiltrative process. Upper endoscopy may be helpful for further
evaluation. The duodenum, small bowel and colon are grossly normal.
No obvious inflammatory changes, mass lesions or obstructive
findings. The appendix is surgically absent. No mesenteric or
retroperitoneal mass or adenopathy. Advanced atherosclerotic
calcifications involving the aorta and branch vessels.

The uterus is surgically absent. The bladder is unremarkable. No
pelvic mass or adenopathy. No significant free pelvic fluid
collections. No inguinal mass or adenopathy.

The lumbar spine demonstrates stable Schmorl's nodes, Osteo poor
Oasis and compression fractures.
IMPRESSION: Persistent chronic inflammatory changes involving the lung bases.

No acute abdominal/ pelvic findings, mass lesions or adenopathy.

Apparent wall thickening in the body region of the stomach. Could
not exclude a inflammatory or infiltrative process. Upper endoscopy
may be helpful for further evaluation.
